# Patient Record
Sex: Female | Born: 1950
Health system: Southern US, Community
[De-identification: ages and names within clinical notes are randomized; demographics above are authoritative.]

## PROBLEM LIST (undated history)

## (undated) DIAGNOSIS — D649 Anemia, unspecified: Secondary | ICD-10-CM

## (undated) DIAGNOSIS — I2510 Atherosclerotic heart disease of native coronary artery without angina pectoris: Secondary | ICD-10-CM

## (undated) HISTORY — PX: TONSILLECTOMY: SUR1361

---

## 2012-06-08 ENCOUNTER — Encounter (INDEPENDENT_AMBULATORY_CARE_PROVIDER_SITE_OTHER): Payer: Self-pay | Admitting: Ophthalmology

## 2012-06-13 ENCOUNTER — Encounter (INDEPENDENT_AMBULATORY_CARE_PROVIDER_SITE_OTHER): Payer: PRIVATE HEALTH INSURANCE | Admitting: Ophthalmology

## 2012-06-13 DIAGNOSIS — H43819 Vitreous degeneration, unspecified eye: Secondary | ICD-10-CM

## 2012-06-13 DIAGNOSIS — H2510 Age-related nuclear cataract, unspecified eye: Secondary | ICD-10-CM

## 2012-06-13 DIAGNOSIS — B399 Histoplasmosis, unspecified: Secondary | ICD-10-CM

## 2014-01-28 DIAGNOSIS — H32 Chorioretinal disorders in diseases classified elsewhere: Secondary | ICD-10-CM | POA: Insufficient documentation

## 2014-01-28 DIAGNOSIS — B399 Histoplasmosis, unspecified: Secondary | ICD-10-CM | POA: Insufficient documentation

## 2015-01-10 DIAGNOSIS — Z23 Encounter for immunization: Secondary | ICD-10-CM | POA: Diagnosis not present

## 2015-06-29 DIAGNOSIS — Z136 Encounter for screening for cardiovascular disorders: Secondary | ICD-10-CM | POA: Diagnosis not present

## 2015-06-29 DIAGNOSIS — Z1239 Encounter for other screening for malignant neoplasm of breast: Secondary | ICD-10-CM | POA: Diagnosis not present

## 2015-06-29 DIAGNOSIS — Z23 Encounter for immunization: Secondary | ICD-10-CM | POA: Diagnosis not present

## 2015-06-29 DIAGNOSIS — Z0000 Encounter for general adult medical examination without abnormal findings: Secondary | ICD-10-CM | POA: Diagnosis not present

## 2015-06-29 DIAGNOSIS — Z720 Tobacco use: Secondary | ICD-10-CM | POA: Diagnosis not present

## 2015-06-29 DIAGNOSIS — Z139 Encounter for screening, unspecified: Secondary | ICD-10-CM | POA: Diagnosis not present

## 2015-06-29 DIAGNOSIS — Z13820 Encounter for screening for osteoporosis: Secondary | ICD-10-CM | POA: Diagnosis not present

## 2015-06-29 DIAGNOSIS — Z131 Encounter for screening for diabetes mellitus: Secondary | ICD-10-CM | POA: Diagnosis not present

## 2015-07-01 DIAGNOSIS — M79674 Pain in right toe(s): Secondary | ICD-10-CM | POA: Diagnosis not present

## 2015-07-01 DIAGNOSIS — B351 Tinea unguium: Secondary | ICD-10-CM | POA: Diagnosis not present

## 2015-07-01 DIAGNOSIS — M79675 Pain in left toe(s): Secondary | ICD-10-CM | POA: Diagnosis not present

## 2015-07-08 DIAGNOSIS — M8589 Other specified disorders of bone density and structure, multiple sites: Secondary | ICD-10-CM | POA: Diagnosis not present

## 2015-07-08 DIAGNOSIS — M810 Age-related osteoporosis without current pathological fracture: Secondary | ICD-10-CM | POA: Diagnosis not present

## 2015-07-08 DIAGNOSIS — Z780 Asymptomatic menopausal state: Secondary | ICD-10-CM | POA: Diagnosis not present

## 2015-07-08 DIAGNOSIS — Z1231 Encounter for screening mammogram for malignant neoplasm of breast: Secondary | ICD-10-CM | POA: Diagnosis not present

## 2015-07-29 DIAGNOSIS — M79675 Pain in left toe(s): Secondary | ICD-10-CM | POA: Diagnosis not present

## 2015-07-29 DIAGNOSIS — M79674 Pain in right toe(s): Secondary | ICD-10-CM | POA: Diagnosis not present

## 2015-07-29 DIAGNOSIS — B351 Tinea unguium: Secondary | ICD-10-CM | POA: Diagnosis not present

## 2015-07-31 DIAGNOSIS — H5212 Myopia, left eye: Secondary | ICD-10-CM | POA: Diagnosis not present

## 2015-07-31 DIAGNOSIS — H52222 Regular astigmatism, left eye: Secondary | ICD-10-CM | POA: Diagnosis not present

## 2015-07-31 DIAGNOSIS — B399 Histoplasmosis, unspecified: Secondary | ICD-10-CM | POA: Diagnosis not present

## 2015-07-31 DIAGNOSIS — H25019 Cortical age-related cataract, unspecified eye: Secondary | ICD-10-CM | POA: Diagnosis not present

## 2015-08-26 DIAGNOSIS — M79675 Pain in left toe(s): Secondary | ICD-10-CM | POA: Diagnosis not present

## 2015-08-26 DIAGNOSIS — M79674 Pain in right toe(s): Secondary | ICD-10-CM | POA: Diagnosis not present

## 2015-08-26 DIAGNOSIS — B351 Tinea unguium: Secondary | ICD-10-CM | POA: Diagnosis not present

## 2015-08-27 ENCOUNTER — Other Ambulatory Visit: Payer: Self-pay | Admitting: Acute Care

## 2015-08-27 DIAGNOSIS — F17210 Nicotine dependence, cigarettes, uncomplicated: Principal | ICD-10-CM

## 2015-10-14 DIAGNOSIS — M79675 Pain in left toe(s): Secondary | ICD-10-CM | POA: Diagnosis not present

## 2015-10-14 DIAGNOSIS — B351 Tinea unguium: Secondary | ICD-10-CM | POA: Diagnosis not present

## 2015-10-14 DIAGNOSIS — M79674 Pain in right toe(s): Secondary | ICD-10-CM | POA: Diagnosis not present

## 2015-12-14 ENCOUNTER — Inpatient Hospital Stay: Admission: RE | Admit: 2015-12-14 | Payer: PRIVATE HEALTH INSURANCE | Source: Ambulatory Visit

## 2015-12-14 ENCOUNTER — Encounter: Payer: PRIVATE HEALTH INSURANCE | Admitting: Acute Care

## 2015-12-15 DIAGNOSIS — Z23 Encounter for immunization: Secondary | ICD-10-CM | POA: Diagnosis not present

## 2016-07-20 DIAGNOSIS — Z1231 Encounter for screening mammogram for malignant neoplasm of breast: Secondary | ICD-10-CM | POA: Diagnosis not present

## 2016-08-01 DIAGNOSIS — M810 Age-related osteoporosis without current pathological fracture: Secondary | ICD-10-CM | POA: Diagnosis not present

## 2016-08-01 DIAGNOSIS — M6248 Contracture of muscle, other site: Secondary | ICD-10-CM | POA: Diagnosis not present

## 2016-08-01 DIAGNOSIS — Z23 Encounter for immunization: Secondary | ICD-10-CM | POA: Diagnosis not present

## 2016-08-01 DIAGNOSIS — Z720 Tobacco use: Secondary | ICD-10-CM | POA: Diagnosis not present

## 2016-08-01 DIAGNOSIS — Z0000 Encounter for general adult medical examination without abnormal findings: Secondary | ICD-10-CM | POA: Diagnosis not present

## 2016-08-03 ENCOUNTER — Other Ambulatory Visit: Payer: Self-pay | Admitting: Acute Care

## 2016-08-03 DIAGNOSIS — F17210 Nicotine dependence, cigarettes, uncomplicated: Secondary | ICD-10-CM

## 2016-08-17 ENCOUNTER — Inpatient Hospital Stay: Admission: RE | Admit: 2016-08-17 | Payer: PRIVATE HEALTH INSURANCE | Source: Ambulatory Visit

## 2016-08-17 ENCOUNTER — Encounter: Payer: PRIVATE HEALTH INSURANCE | Admitting: Acute Care

## 2016-08-18 ENCOUNTER — Telehealth: Payer: Self-pay | Admitting: Acute Care

## 2016-08-19 ENCOUNTER — Encounter: Payer: PRIVATE HEALTH INSURANCE | Admitting: Acute Care

## 2016-08-19 ENCOUNTER — Ambulatory Visit: Payer: PRIVATE HEALTH INSURANCE

## 2016-08-22 NOTE — Telephone Encounter (Signed)
Spoke with pt and rescheduled for [facility] 08/24/16 11:00 CT will be rescheduled Nothing further needed

## 2016-08-24 ENCOUNTER — Encounter: Payer: Self-pay | Admitting: Acute Care

## 2016-08-24 ENCOUNTER — Ambulatory Visit (INDEPENDENT_AMBULATORY_CARE_PROVIDER_SITE_OTHER)
Admission: RE | Admit: 2016-08-24 | Discharge: 2016-08-24 | Disposition: A | Discharge status: Home / Self Care | Payer: Medicare Other | Source: Ambulatory Visit | Attending: Acute Care | Admitting: Acute Care

## 2016-08-24 ENCOUNTER — Ambulatory Visit (INDEPENDENT_AMBULATORY_CARE_PROVIDER_SITE_OTHER): Payer: Medicare Other | Admitting: Acute Care

## 2016-08-24 DIAGNOSIS — Z87891 Personal history of nicotine dependence: Secondary | ICD-10-CM

## 2016-08-24 DIAGNOSIS — F17210 Nicotine dependence, cigarettes, uncomplicated: Secondary | ICD-10-CM

## 2016-08-24 NOTE — Progress Notes (Signed)
Shared Decision Making Visit Lung Cancer Screening Program [phone])   Eligibility:  Age 66 y.o.  Pack Years Smoking History Calculation 73.5 pack year smoking history (# packs/per year x # years smoked)  Recent History of coughing up blood  no  Unexplained weight loss? no ( >Than 15 pounds within the last 6 months )  Prior History Lung / other cancer no (Diagnosis within the last 5 years already requiring surveillance chest CT Scans).  Smoking Status Current Smoker  Former Smokers: Years since quit:NA  Quit Date: NA  Visit Components:  Discussion included one or more decision making aids. yes  Discussion included risk/benefits of screening. yes  Discussion included potential follow up diagnostic testing for abnormal scans. yes  Discussion included meaning and risk of over diagnosis. yes  Discussion included meaning and risk of False Positives. yes  Discussion included meaning of total radiation exposure. yes  Counseling Included:  Importance of adherence to annual lung cancer LDCT screening. yes  Impact of comorbidities on ability to participate in the program. yes  Ability and willingness to under diagnostic treatment. yes  Smoking Cessation Counseling:  Current Smokers:   Discussed importance of smoking cessation. yes  Information about tobacco cessation classes and interventions provided to patient. yes  Patient provided with "ticket" for LDCT Scan. yes  Symptomatic Patient. no  CounselingNA  Diagnosis Code: Tobacco Use Z72.0  Asymptomatic Patient yes  Counseling (Intermediate counseling: > three minutes counseling) B2841  Former Smokers:   Discussed the importance of maintaining cigarette abstinence. yes  Diagnosis Code: Personal History of Nicotine Dependence. L24.401  Information about tobacco cessation classes and interventions provided to patient. Yes  Patient provided with "ticket" for LDCT Scan. yes  Written Order for Lung Cancer  Screening with LDCT placed in Epic. Yes (CT Chest Lung Cancer Screening Low Dose W/O CM) [id number] Z12.2-Screening of respiratory organs Z87.891-Personal history of nicotine dependence  I have spent 25 minutes of face to face time with Ms. Padgett discussing the risks and benefits of lung cancer screening. We viewed a power point together that explained in detail the above noted topics. We paused at intervals to allow for questions to be asked and answered to ensure understanding.We discussed that the single most powerful action that she can take to decrease her risk of developing lung cancer is to quit smoking. We discussed whether or not she is ready to commit to setting a quit date. She is currently not ready to set a quit date.We discussed options for tools to aid in quitting smoking including nicotine replacement therapy, non-nicotine medications, support groups, Quit Smart classes, and behavior modification. We discussed that often times setting smaller, more achievable goals, such as eliminating 1 cigarette a day for a week and then 2 cigarettes a day for a week can be helpful in slowly decreasing the number of cigarettes smoked. This allows for a sense of accomplishment as well as providing a clinical benefit. I gave her the " Be Stronger Than Your Excuses" card with contact information for community resources, classes, free nicotine replacement therapy, and access to mobile apps, text messaging, and on-line smoking cessation help. I have also given her my card and contact information in the event she needs to contact me. We discussed the time and location of the scan, and that either Doroteo Glassman RN or I will call with the results within 24-48 hours of receiving them. I have offered her  a copy of the power point we viewed  as a  resource in the event they need reinforcement of the concepts we discussed today in the office. The patient verbalized understanding of all of  the above and had no further  questions upon leaving the office. They have my contact information in the event they have any further questions.  I spent 5 minutes counseling on smoking cessation and the health risks of continued tobacco abuse.  I explained to the patient that there has been a high incidence of coronary artery disease noted on these exams. I explained that this is a non-gated exam therefore degree or severity cannot be determined. This patient is not on statin therapy. I have asked the patient to follow-up with their PCP regarding any incidental finding of coronary artery disease and management with risk factor assessment,  diet or medication as their PCP  feels is clinically indicated. The patient verbalized understanding of the above and had no further questions upon completion of the visit.      Magdalen Spatz, NP 08/24/2016

## 2016-09-01 ENCOUNTER — Other Ambulatory Visit: Payer: Self-pay | Admitting: Acute Care

## 2016-09-01 DIAGNOSIS — F17210 Nicotine dependence, cigarettes, uncomplicated: Principal | ICD-10-CM

## 2017-01-19 DIAGNOSIS — Z23 Encounter for immunization: Secondary | ICD-10-CM | POA: Diagnosis not present

## 2017-01-20 DIAGNOSIS — H3581 Retinal edema: Secondary | ICD-10-CM | POA: Diagnosis not present

## 2017-01-20 DIAGNOSIS — B399 Histoplasmosis, unspecified: Secondary | ICD-10-CM | POA: Diagnosis not present

## 2017-01-23 ENCOUNTER — Encounter (INDEPENDENT_AMBULATORY_CARE_PROVIDER_SITE_OTHER): Payer: Medicare Other | Admitting: Ophthalmology

## 2017-01-23 DIAGNOSIS — H43813 Vitreous degeneration, bilateral: Secondary | ICD-10-CM | POA: Diagnosis not present

## 2017-01-23 DIAGNOSIS — H2513 Age-related nuclear cataract, bilateral: Secondary | ICD-10-CM | POA: Diagnosis not present

## 2017-01-23 DIAGNOSIS — H32 Chorioretinal disorders in diseases classified elsewhere: Secondary | ICD-10-CM | POA: Diagnosis not present

## 2017-01-23 DIAGNOSIS — B399 Histoplasmosis, unspecified: Secondary | ICD-10-CM

## 2017-01-23 DIAGNOSIS — H318 Other specified disorders of choroid: Secondary | ICD-10-CM

## 2017-01-31 ENCOUNTER — Encounter (INDEPENDENT_AMBULATORY_CARE_PROVIDER_SITE_OTHER): Payer: Medicare Other | Admitting: Ophthalmology

## 2017-01-31 DIAGNOSIS — H43813 Vitreous degeneration, bilateral: Secondary | ICD-10-CM | POA: Diagnosis not present

## 2017-01-31 DIAGNOSIS — H318 Other specified disorders of choroid: Secondary | ICD-10-CM

## 2017-01-31 DIAGNOSIS — L820 Inflamed seborrheic keratosis: Secondary | ICD-10-CM | POA: Diagnosis not present

## 2017-01-31 DIAGNOSIS — L299 Pruritus, unspecified: Secondary | ICD-10-CM | POA: Diagnosis not present

## 2017-02-17 ENCOUNTER — Encounter (INDEPENDENT_AMBULATORY_CARE_PROVIDER_SITE_OTHER): Payer: Medicare Other | Admitting: Ophthalmology

## 2017-02-17 DIAGNOSIS — H318 Other specified disorders of choroid: Secondary | ICD-10-CM | POA: Diagnosis not present

## 2017-02-17 DIAGNOSIS — H43813 Vitreous degeneration, bilateral: Secondary | ICD-10-CM | POA: Diagnosis not present

## 2017-02-17 DIAGNOSIS — H32 Chorioretinal disorders in diseases classified elsewhere: Secondary | ICD-10-CM | POA: Diagnosis not present

## 2017-02-17 DIAGNOSIS — H2513 Age-related nuclear cataract, bilateral: Secondary | ICD-10-CM | POA: Diagnosis not present

## 2017-02-17 DIAGNOSIS — B399 Histoplasmosis, unspecified: Secondary | ICD-10-CM

## 2017-03-17 ENCOUNTER — Encounter (INDEPENDENT_AMBULATORY_CARE_PROVIDER_SITE_OTHER): Payer: Medicare Other | Admitting: Ophthalmology

## 2017-03-17 DIAGNOSIS — H318 Other specified disorders of choroid: Secondary | ICD-10-CM | POA: Diagnosis not present

## 2017-03-17 DIAGNOSIS — B399 Histoplasmosis, unspecified: Secondary | ICD-10-CM

## 2017-03-17 DIAGNOSIS — H43813 Vitreous degeneration, bilateral: Secondary | ICD-10-CM | POA: Diagnosis not present

## 2017-03-17 DIAGNOSIS — H32 Chorioretinal disorders in diseases classified elsewhere: Secondary | ICD-10-CM | POA: Diagnosis not present

## 2017-04-14 ENCOUNTER — Encounter (INDEPENDENT_AMBULATORY_CARE_PROVIDER_SITE_OTHER): Payer: Medicare Other | Admitting: Ophthalmology

## 2017-04-14 DIAGNOSIS — H318 Other specified disorders of choroid: Secondary | ICD-10-CM

## 2017-04-14 DIAGNOSIS — H2513 Age-related nuclear cataract, bilateral: Secondary | ICD-10-CM | POA: Diagnosis not present

## 2017-04-14 DIAGNOSIS — H32 Chorioretinal disorders in diseases classified elsewhere: Secondary | ICD-10-CM | POA: Diagnosis not present

## 2017-04-14 DIAGNOSIS — H43813 Vitreous degeneration, bilateral: Secondary | ICD-10-CM | POA: Diagnosis not present

## 2017-05-12 ENCOUNTER — Encounter (INDEPENDENT_AMBULATORY_CARE_PROVIDER_SITE_OTHER): Payer: Medicare Other | Admitting: Ophthalmology

## 2017-05-12 DIAGNOSIS — H2513 Age-related nuclear cataract, bilateral: Secondary | ICD-10-CM

## 2017-05-12 DIAGNOSIS — H43813 Vitreous degeneration, bilateral: Secondary | ICD-10-CM

## 2017-05-12 DIAGNOSIS — H318 Other specified disorders of choroid: Secondary | ICD-10-CM

## 2017-06-09 ENCOUNTER — Encounter (INDEPENDENT_AMBULATORY_CARE_PROVIDER_SITE_OTHER): Payer: Medicare Other | Admitting: Ophthalmology

## 2017-06-09 DIAGNOSIS — H2513 Age-related nuclear cataract, bilateral: Secondary | ICD-10-CM | POA: Diagnosis not present

## 2017-06-09 DIAGNOSIS — H318 Other specified disorders of choroid: Secondary | ICD-10-CM

## 2017-06-09 DIAGNOSIS — H43813 Vitreous degeneration, bilateral: Secondary | ICD-10-CM | POA: Diagnosis not present

## 2017-06-09 DIAGNOSIS — B399 Histoplasmosis, unspecified: Secondary | ICD-10-CM | POA: Diagnosis not present

## 2017-06-09 DIAGNOSIS — H32 Chorioretinal disorders in diseases classified elsewhere: Secondary | ICD-10-CM

## 2017-07-06 ENCOUNTER — Encounter (INDEPENDENT_AMBULATORY_CARE_PROVIDER_SITE_OTHER): Payer: Medicare Other | Admitting: Ophthalmology

## 2017-07-06 DIAGNOSIS — H318 Other specified disorders of choroid: Secondary | ICD-10-CM | POA: Diagnosis not present

## 2017-07-06 DIAGNOSIS — H2513 Age-related nuclear cataract, bilateral: Secondary | ICD-10-CM | POA: Diagnosis not present

## 2017-07-06 DIAGNOSIS — B399 Histoplasmosis, unspecified: Secondary | ICD-10-CM

## 2017-07-06 DIAGNOSIS — H32 Chorioretinal disorders in diseases classified elsewhere: Secondary | ICD-10-CM

## 2017-07-06 DIAGNOSIS — H43813 Vitreous degeneration, bilateral: Secondary | ICD-10-CM | POA: Diagnosis not present

## 2017-08-03 ENCOUNTER — Encounter (INDEPENDENT_AMBULATORY_CARE_PROVIDER_SITE_OTHER): Payer: Medicare Other | Admitting: Ophthalmology

## 2017-08-03 DIAGNOSIS — Z720 Tobacco use: Secondary | ICD-10-CM | POA: Diagnosis not present

## 2017-08-03 DIAGNOSIS — Z131 Encounter for screening for diabetes mellitus: Secondary | ICD-10-CM | POA: Diagnosis not present

## 2017-08-03 DIAGNOSIS — M810 Age-related osteoporosis without current pathological fracture: Secondary | ICD-10-CM | POA: Diagnosis not present

## 2017-08-03 DIAGNOSIS — Z0000 Encounter for general adult medical examination without abnormal findings: Secondary | ICD-10-CM | POA: Diagnosis not present

## 2017-08-04 ENCOUNTER — Encounter (INDEPENDENT_AMBULATORY_CARE_PROVIDER_SITE_OTHER): Payer: Medicare Other | Admitting: Ophthalmology

## 2017-08-04 DIAGNOSIS — H43813 Vitreous degeneration, bilateral: Secondary | ICD-10-CM

## 2017-08-04 DIAGNOSIS — H318 Other specified disorders of choroid: Secondary | ICD-10-CM

## 2017-08-04 DIAGNOSIS — H2513 Age-related nuclear cataract, bilateral: Secondary | ICD-10-CM

## 2017-08-04 DIAGNOSIS — H4423 Degenerative myopia, bilateral: Secondary | ICD-10-CM

## 2017-09-08 ENCOUNTER — Encounter (INDEPENDENT_AMBULATORY_CARE_PROVIDER_SITE_OTHER): Payer: Medicare Other | Admitting: Ophthalmology

## 2017-09-08 DIAGNOSIS — H43813 Vitreous degeneration, bilateral: Secondary | ICD-10-CM

## 2017-09-08 DIAGNOSIS — H318 Other specified disorders of choroid: Secondary | ICD-10-CM

## 2017-09-08 DIAGNOSIS — B399 Histoplasmosis, unspecified: Secondary | ICD-10-CM

## 2017-09-08 DIAGNOSIS — H32 Chorioretinal disorders in diseases classified elsewhere: Secondary | ICD-10-CM

## 2017-09-13 DIAGNOSIS — B399 Histoplasmosis, unspecified: Secondary | ICD-10-CM | POA: Diagnosis not present

## 2017-09-13 DIAGNOSIS — H3581 Retinal edema: Secondary | ICD-10-CM | POA: Diagnosis not present

## 2017-10-02 ENCOUNTER — Other Ambulatory Visit: Payer: Self-pay | Admitting: *Deleted

## 2017-10-02 DIAGNOSIS — Z122 Encounter for screening for malignant neoplasm of respiratory organs: Secondary | ICD-10-CM

## 2017-10-02 DIAGNOSIS — F17210 Nicotine dependence, cigarettes, uncomplicated: Principal | ICD-10-CM

## 2017-10-13 ENCOUNTER — Encounter (INDEPENDENT_AMBULATORY_CARE_PROVIDER_SITE_OTHER): Payer: Medicare Other | Admitting: Ophthalmology

## 2017-10-13 DIAGNOSIS — B399 Histoplasmosis, unspecified: Secondary | ICD-10-CM

## 2017-10-13 DIAGNOSIS — H43813 Vitreous degeneration, bilateral: Secondary | ICD-10-CM | POA: Diagnosis not present

## 2017-10-13 DIAGNOSIS — H318 Other specified disorders of choroid: Secondary | ICD-10-CM | POA: Diagnosis not present

## 2017-10-13 DIAGNOSIS — H32 Chorioretinal disorders in diseases classified elsewhere: Secondary | ICD-10-CM

## 2017-11-20 ENCOUNTER — Ambulatory Visit (INDEPENDENT_AMBULATORY_CARE_PROVIDER_SITE_OTHER)
Admission: RE | Admit: 2017-11-20 | Discharge: 2017-11-20 | Disposition: A | Discharge status: Home / Self Care | Payer: Medicare Other | Source: Ambulatory Visit | Attending: Acute Care | Admitting: Acute Care

## 2017-11-20 DIAGNOSIS — F17210 Nicotine dependence, cigarettes, uncomplicated: Secondary | ICD-10-CM

## 2017-11-23 ENCOUNTER — Other Ambulatory Visit: Payer: Self-pay | Admitting: Acute Care

## 2017-11-23 DIAGNOSIS — Z122 Encounter for screening for malignant neoplasm of respiratory organs: Secondary | ICD-10-CM

## 2017-11-23 DIAGNOSIS — F17210 Nicotine dependence, cigarettes, uncomplicated: Principal | ICD-10-CM

## 2017-11-24 ENCOUNTER — Encounter (INDEPENDENT_AMBULATORY_CARE_PROVIDER_SITE_OTHER): Payer: Medicare Other | Admitting: Ophthalmology

## 2017-11-27 ENCOUNTER — Encounter (INDEPENDENT_AMBULATORY_CARE_PROVIDER_SITE_OTHER): Payer: Medicare Other | Admitting: Ophthalmology

## 2017-11-27 DIAGNOSIS — H32 Chorioretinal disorders in diseases classified elsewhere: Secondary | ICD-10-CM

## 2017-11-27 DIAGNOSIS — H3563 Retinal hemorrhage, bilateral: Secondary | ICD-10-CM | POA: Diagnosis not present

## 2017-11-27 DIAGNOSIS — B399 Histoplasmosis, unspecified: Secondary | ICD-10-CM

## 2017-11-27 DIAGNOSIS — H2513 Age-related nuclear cataract, bilateral: Secondary | ICD-10-CM

## 2017-11-27 DIAGNOSIS — H43813 Vitreous degeneration, bilateral: Secondary | ICD-10-CM | POA: Diagnosis not present

## 2017-12-06 DIAGNOSIS — M8588 Other specified disorders of bone density and structure, other site: Secondary | ICD-10-CM | POA: Diagnosis not present

## 2017-12-06 DIAGNOSIS — Z1231 Encounter for screening mammogram for malignant neoplasm of breast: Secondary | ICD-10-CM | POA: Diagnosis not present

## 2017-12-06 DIAGNOSIS — M810 Age-related osteoporosis without current pathological fracture: Secondary | ICD-10-CM | POA: Diagnosis not present

## 2017-12-15 DIAGNOSIS — M810 Age-related osteoporosis without current pathological fracture: Secondary | ICD-10-CM | POA: Diagnosis not present

## 2017-12-29 DIAGNOSIS — M810 Age-related osteoporosis without current pathological fracture: Secondary | ICD-10-CM | POA: Diagnosis not present

## 2018-01-08 ENCOUNTER — Encounter (INDEPENDENT_AMBULATORY_CARE_PROVIDER_SITE_OTHER): Payer: Medicare Other | Admitting: Ophthalmology

## 2018-01-08 DIAGNOSIS — H2513 Age-related nuclear cataract, bilateral: Secondary | ICD-10-CM

## 2018-01-08 DIAGNOSIS — H43813 Vitreous degeneration, bilateral: Secondary | ICD-10-CM | POA: Diagnosis not present

## 2018-01-08 DIAGNOSIS — B399 Histoplasmosis, unspecified: Secondary | ICD-10-CM | POA: Diagnosis not present

## 2018-01-08 DIAGNOSIS — H318 Other specified disorders of choroid: Secondary | ICD-10-CM

## 2018-01-08 DIAGNOSIS — H32 Chorioretinal disorders in diseases classified elsewhere: Secondary | ICD-10-CM

## 2018-01-23 ENCOUNTER — Emergency Department (HOSPITAL_COMMUNITY): Payer: Medicare Other

## 2018-01-23 ENCOUNTER — Other Ambulatory Visit: Payer: Self-pay

## 2018-01-23 ENCOUNTER — Emergency Department (HOSPITAL_BASED_OUTPATIENT_CLINIC_OR_DEPARTMENT_OTHER): Payer: Medicare Other

## 2018-01-23 ENCOUNTER — Encounter (HOSPITAL_COMMUNITY): Payer: Self-pay | Admitting: Emergency Medicine

## 2018-01-23 ENCOUNTER — Observation Stay (HOSPITAL_COMMUNITY)
Admission: EM | Admit: 2018-01-23 | Discharge: 2018-01-24 | Disposition: A | Discharge status: Home / Self Care | Payer: Medicare Other | Attending: Internal Medicine | Admitting: Internal Medicine

## 2018-01-23 DIAGNOSIS — I824Z1 Acute embolism and thrombosis of unspecified deep veins of right distal lower extremity: Secondary | ICD-10-CM

## 2018-01-23 DIAGNOSIS — J40 Bronchitis, not specified as acute or chronic: Secondary | ICD-10-CM | POA: Diagnosis not present

## 2018-01-23 DIAGNOSIS — I2510 Atherosclerotic heart disease of native coronary artery without angina pectoris: Secondary | ICD-10-CM | POA: Diagnosis not present

## 2018-01-23 DIAGNOSIS — I82441 Acute embolism and thrombosis of right tibial vein: Secondary | ICD-10-CM | POA: Diagnosis not present

## 2018-01-23 DIAGNOSIS — D649 Anemia, unspecified: Principal | ICD-10-CM | POA: Diagnosis present

## 2018-01-23 DIAGNOSIS — E538 Deficiency of other specified B group vitamins: Secondary | ICD-10-CM | POA: Diagnosis present

## 2018-01-23 DIAGNOSIS — F17210 Nicotine dependence, cigarettes, uncomplicated: Secondary | ICD-10-CM | POA: Diagnosis not present

## 2018-01-23 DIAGNOSIS — I82409 Acute embolism and thrombosis of unspecified deep veins of unspecified lower extremity: Secondary | ICD-10-CM | POA: Diagnosis present

## 2018-01-23 DIAGNOSIS — R0602 Shortness of breath: Secondary | ICD-10-CM

## 2018-01-23 DIAGNOSIS — D61818 Other pancytopenia: Secondary | ICD-10-CM | POA: Diagnosis not present

## 2018-01-23 DIAGNOSIS — R9431 Abnormal electrocardiogram [ECG] [EKG]: Secondary | ICD-10-CM | POA: Diagnosis not present

## 2018-01-23 DIAGNOSIS — D510 Vitamin B12 deficiency anemia due to intrinsic factor deficiency: Secondary | ICD-10-CM

## 2018-01-23 HISTORY — DX: Atherosclerotic heart disease of native coronary artery without angina pectoris: I25.10

## 2018-01-23 LAB — COMPREHENSIVE METABOLIC PANEL
ALK PHOS: 58 U/L (ref 38–126)
ALT: 12 U/L (ref 0–44)
AST: 46 U/L — ABNORMAL HIGH (ref 15–41)
Albumin: 3.9 g/dL (ref 3.5–5.0)
Anion gap: 9 (ref 5–15)
BUN: 18 mg/dL (ref 8–23)
CO2: 27 mmol/L (ref 22–32)
CREATININE: 0.78 mg/dL (ref 0.44–1.00)
Calcium: 9.8 mg/dL (ref 8.9–10.3)
Chloride: 105 mmol/L (ref 98–111)
Glucose, Bld: 112 mg/dL — ABNORMAL HIGH (ref 70–99)
Potassium: 3.6 mmol/L (ref 3.5–5.1)
Sodium: 141 mmol/L (ref 135–145)
Total Bilirubin: 1.5 mg/dL — ABNORMAL HIGH (ref 0.3–1.2)
Total Protein: 6.9 g/dL (ref 6.5–8.1)

## 2018-01-23 LAB — FOLATE: Folate: 7.5 ng/mL (ref >5.9)

## 2018-01-23 LAB — IRON AND TIBC
IRON: 299 ug/dL — AB (ref 28–170)
Saturation Ratios: 89 % — ABNORMAL HIGH (ref 10.4–31.8)
TIBC: 335 ug/dL (ref 250–450)
UIBC: 36 ug/dL

## 2018-01-23 LAB — PROTIME-INR
INR: 1.03
PROTHROMBIN TIME: 13.5 s (ref 11.4–15.2)

## 2018-01-23 LAB — CBC WITH DIFFERENTIAL/PLATELET
Abs Immature Granulocytes: 0.07 10*3/uL (ref 0.00–0.07)
BASOS PCT: 0 %
Basophils Absolute: 0 10*3/uL (ref 0.0–0.1)
EOS ABS: 0.2 10*3/uL (ref 0.0–0.5)
EOS PCT: 5 %
HEMATOCRIT: 14.4 % — AB (ref 36.0–46.0)
HEMOGLOBIN: 4.9 g/dL — AB (ref 12.0–15.0)
Immature Granulocytes: 2 %
Lymphocytes Relative: 27 %
Lymphs Abs: 1 10*3/uL (ref 0.7–4.0)
MCH: 38.9 pg — AB (ref 26.0–34.0)
MCHC: 34 g/dL (ref 30.0–36.0)
MCV: 114.3 fL — ABNORMAL HIGH (ref 80.0–100.0)
MONOS PCT: 4 %
Monocytes Absolute: 0.2 10*3/uL (ref 0.1–1.0)
Neutro Abs: 2.4 10*3/uL (ref 1.7–7.7)
Neutrophils Relative %: 62 %
Platelets: 48 10*3/uL — ABNORMAL LOW (ref 150–400)
RBC: 1.26 MIL/uL — ABNORMAL LOW (ref 3.87–5.11)
RDW: 21.9 % — ABNORMAL HIGH (ref 11.5–15.5)
WBC: 3.9 10*3/uL — ABNORMAL LOW (ref 4.0–10.5)
nRBC: 0.5 % — ABNORMAL HIGH (ref 0.0–0.2)

## 2018-01-23 LAB — PREPARE RBC (CROSSMATCH)

## 2018-01-23 LAB — RETICULOCYTES
Immature Retic Fract: 12.3 % (ref 2.3–15.9)
RBC.: 1.31 MIL/uL — ABNORMAL LOW (ref 3.87–5.11)
RETIC CT PCT: 2.4 % (ref 0.4–3.1)
Retic Count, Absolute: 31.7 10*3/uL (ref 19.0–186.0)

## 2018-01-23 LAB — ACETAMINOPHEN LEVEL

## 2018-01-23 LAB — VITAMIN B12: Vitamin B-12: <50 pg/mL — ABNORMAL LOW (ref 180–914)

## 2018-01-23 LAB — SALICYLATE LEVEL

## 2018-01-23 LAB — I-STAT TROPONIN, ED: TROPONIN I, POC: 0 ng/mL (ref 0.00–0.08)

## 2018-01-23 LAB — BILIRUBIN, DIRECT: Bilirubin, Direct: 0.3 mg/dL — ABNORMAL HIGH (ref 0.0–0.2)

## 2018-01-23 LAB — POC OCCULT BLOOD, ED: FECAL OCCULT BLD: NEGATIVE

## 2018-01-23 LAB — FERRITIN: Ferritin: 66 ng/mL (ref 11–307)

## 2018-01-23 LAB — AMMONIA: Ammonia: 25 umol/L (ref 9–35)

## 2018-01-23 LAB — BRAIN NATRIURETIC PEPTIDE: B NATRIURETIC PEPTIDE 5: 157.5 pg/mL — AB (ref 0.0–100.0)

## 2018-01-23 MED ORDER — ACETAMINOPHEN 650 MG RE SUPP: 650.0000 mg | Freq: Four times a day (QID) | RECTAL | Status: DC | PRN

## 2018-01-23 MED ORDER — IOPAMIDOL (ISOVUE-370) INJECTION 76%
INTRAVENOUS | Status: AC
  Filled 2018-01-23: qty 100

## 2018-01-23 MED ORDER — NICOTINE 21 MG/24HR TD PT24
21.0000 mg | MEDICATED_PATCH | Freq: Once | TRANSDERMAL | Status: AC
  Administered 2018-01-23: 21 mg via TRANSDERMAL
  Filled 2018-01-23: qty 1

## 2018-01-23 MED ORDER — SODIUM CHLORIDE 0.9 % IV SOLN
10.0000 mL/h | Freq: Once | INTRAVENOUS | Status: AC
  Administered 2018-01-23: 10 mL/h via INTRAVENOUS

## 2018-01-23 MED ORDER — ONDANSETRON HCL 4 MG/2ML IJ SOLN: 4.0000 mg | Freq: Four times a day (QID) | INTRAMUSCULAR | Status: DC | PRN

## 2018-01-23 MED ORDER — ONDANSETRON HCL 4 MG PO TABS: 4.0000 mg | ORAL_TABLET | Freq: Four times a day (QID) | ORAL | Status: DC | PRN

## 2018-01-23 MED ORDER — CYANOCOBALAMIN 1000 MCG/ML IJ SOLN
1000.0000 ug | Freq: Every day | INTRAMUSCULAR | Status: DC
  Administered 2018-01-23 – 2018-01-24 (×2): 1000 ug via SUBCUTANEOUS
  Filled 2018-01-23 (×2): qty 1

## 2018-01-23 MED ORDER — HEPARIN SODIUM (PORCINE) 5000 UNIT/ML IJ SOLN
5000.0000 [IU] | Freq: Three times a day (TID) | INTRAMUSCULAR | Status: DC
  Administered 2018-01-23: 5000 [IU] via SUBCUTANEOUS
  Filled 2018-01-23: qty 1

## 2018-01-23 MED ORDER — SODIUM CHLORIDE 0.9 % IJ SOLN
INTRAMUSCULAR | Status: AC
  Filled 2018-01-23: qty 50

## 2018-01-23 MED ORDER — ACETAMINOPHEN 325 MG PO TABS
650.0000 mg | ORAL_TABLET | Freq: Four times a day (QID) | ORAL | Status: DC | PRN
  Administered 2018-01-24: 650 mg via ORAL
  Filled 2018-01-23: qty 2

## 2018-01-23 NOTE — ED Triage Notes (Signed)
Pt c/o worsening shortness of breath x 10 days. Pt denies pain. Pt with recent jaundice x 1-2 days. Pt saw PMD today and PMD performed EKG and noted non-specific ST depression.

## 2018-01-23 NOTE — ED Notes (Signed)
Bed: WA04 Expected date:  Expected time:  Means of arrival:  Comments: Hold triage 2

## 2018-01-23 NOTE — H&P (Addendum)
History and Physical    Jodi Myers QIH:[id number] DOB: 02-Oct-1950 DOA: 01/23/2018  PCP: Michel Harrow, PA-C  Patient coming from: Home.  Chief Complaint: Shortness of breath.  HPI: Jodi Myers is a 67 y.o. female with no significant past medical history presents to the ER with complaints of increasing shortness of breath on exertion over the last 10 days.  Had some nausea on 10 days ago.  He denies abdominal pain chest pain productive cough fever or chills.  Symptoms are mostly exertional.  Takes Excedrin for many years for right shoulder pain.  Has had endoscopy done for stomach ulcer 40 years ago after taking steroids.  ED Course: In the ER patient is found to have severe pancytopenia with a hemoglobin around 4.9 with platelets of 48 and WBC of 3.9.  On exam patient has right lower extremity swelling "which Dopplers were done which was positive for DVT.  Chest x-ray was unremarkable EKG was showing sinus tachycardia troponin is negative.  Anemia panel was done which shows undetectable B12 levels.  Ferritin was 66.  Folate was normal.  Review of Systems: As per HPI, rest all negative.   Past Medical History:  Diagnosis Date  . Coronary artery disease     Past Surgical History:  Procedure Laterality Date  . TONSILLECTOMY       reports that she has been smoking. She has a 73.50 pack-year smoking history. She has never used smokeless tobacco. She reports that she drank alcohol. She reports that she does not use drugs.  Allergies  Allergen Reactions  . Mango Flavor     Blisters  . Poison Ivy Extract     Blisters  . Sporanox [Itraconazole]     Blisters    Family History  Problem Relation Age of Onset  . Diabetes Mellitus II Mother   . Diabetes Mellitus I Sister   . Colon cancer Neg Hx     Prior to Admission medications   Not on File    Physical Exam: Vitals:   01/23/18 1624 01/23/18 2012 01/23/18 2022 01/23/18 2039  BP: (!) 103/41 (!) 105/48  113/60    Pulse:  100  (!) 111  Resp: 17 (!) 22  20  Temp:   99.2 F (37.3 C) (!) 100.7 F (38.2 C)  TempSrc:   Oral Oral  SpO2:  95%    Weight:    70.5 kg  Height:    5\' 5"  (1.651 m)      Constitutional: Moderately built and nourished. Vitals:   01/23/18 1624 01/23/18 2012 01/23/18 2022 01/23/18 2039  BP: (!) 103/41 (!) 105/48  113/60  Pulse:  100  (!) 111  Resp: 17 (!) 22  20  Temp:   99.2 F (37.3 C) (!) 100.7 F (38.2 C)  TempSrc:   Oral Oral  SpO2:  95%    Weight:    70.5 kg  Height:    5\' 5"  (1.651 m)   Eyes: Anicteric mild pallor. ENMT: No discharge from the ears eyes nose or mouth. Neck: No mass or.  No JVD appreciated. Respiratory: No rhonchi or crepitations. Cardiovascular: S1-S2 heard no murmurs appreciated. Abdomen: Soft nontender bowel sounds present. Musculoskeletal: Mitral extremity swelling. Skin: No rash. Neurologic: Alert awake oriented to time place and person.  Moves all extremities. Psychiatric: Appears normal per normal affect.   Labs on Admission: I have personally reviewed following labs and imaging studies  CBC: Recent Labs  Lab 01/23/18 1645  WBC 3.9*  NEUTROABS  2.4  HGB 4.9*  HCT 14.4*  MCV 114.3*  PLT 48*   Basic Metabolic Panel: Recent Labs  Lab 01/23/18 1645  NA 141  K 3.6  CL 105  CO2 27  GLUCOSE 112*  BUN 18  CREATININE 0.78  CALCIUM 9.8   GFR: Estimated Creatinine Clearance: 66.3 mL/min (by C-G formula based on SCr of 0.78 mg/dL). Liver Function Tests: Recent Labs  Lab 01/23/18 1645  AST 46*  ALT 12  ALKPHOS 58  BILITOT 1.5*  PROT 6.9  ALBUMIN 3.9   No results for input(s): LIPASE, AMYLASE in the last 168 hours. Recent Labs  Lab 01/23/18 1732  AMMONIA 25   Coagulation Profile: Recent Labs  Lab 01/23/18 1645  INR 1.03   Cardiac Enzymes: No results for input(s): CKTOTAL, CKMB, CKMBINDEX, TROPONINI in the last 168 hours. BNP (last 3 results) No results for input(s): PROBNP in the last 8760  hours. HbA1C: No results for input(s): HGBA1C in the last 72 hours. CBG: No results for input(s): GLUCAP in the last 168 hours. Lipid Profile: No results for input(s): CHOL, HDL, LDLCALC, TRIG, CHOLHDL, LDLDIRECT in the last 72 hours. Thyroid Function Tests: No results for input(s): TSH, T4TOTAL, FREET4, T3FREE, THYROIDAB in the last 72 hours. Anemia Panel: Recent Labs    01/23/18 1942  VITAMINB12 <50*  FOLATE 7.5  FERRITIN 66  TIBC 335  IRON 299*  RETICCTPCT 2.4   Urine analysis: No results found for: COLORURINE, APPEARANCEUR, LABSPEC, PHURINE, GLUCOSEU, HGBUR, BILIRUBINUR, KETONESUR, PROTEINUR, UROBILINOGEN, NITRITE, LEUKOCYTESUR Sepsis Labs: @LABRCNTIP (procalcitonin:4,lacticidven:4) )No results found for this or any previous visit (from the past 240 hour(s)).   Radiological Exams on Admission: Dg Chest 2 View  Result Date: 01/23/2018 CLINICAL DATA:  Shortness of breath, jaundice. EXAM: CHEST - 2 VIEW COMPARISON:  CT chest November 20, 2017 FINDINGS: Cardiomediastinal silhouette is normal. Mild bronchitic changes. No pleural effusions or focal consolidations. Trachea projects midline and there is no pneumothorax. Soft tissue planes and included osseous structures are non-suspicious. Faint calcifications in the neck are likely vascular. Thoracolumbar dextroscoliosis. IMPRESSION: Bronchitic changes without focal consolidation. Electronically Signed   By: Elon Alas M.D.   On: 01/23/2018 16:34    EKG: Independently reviewed.  Sinus tachycardia.  Assessment/Plan Principal Problem:   Symptomatic anemia Active Problems:   Pancytopenia (HCC)   DVT, lower extremity (HCC)   B12 deficiency    1. Symptomatic anemia -3 units of PRBC transfusion has been ordered.  Follow CBC.  Note that patient stool for blood was negative.  Patient denies noting any blood in the stool. 2. Pancytopenia may be from B12 deficiency.  Discussed with on-call hematologist Dr. Irene Limbo who at this time  advised to get B12 thousand micrograms subcu every day for next 7 days and following which patient will need every week.  Check increasing factor antibodies and parietal cell antibodies with the next blood draw.  Hematologist will be seeing patient in consult. 3. Right lower extremity DVT involving the tibial oriented.  Since this is a low risk DVT and patient has significant thrombocytopenia at this time hematologist Dr. Irene Limbo advised on a DVT prophylaxis and not build on the platelets are improving.  Since patient also has shortness of breath CT angiogram of the chest has been ordered during which will also be getting CT abdomen also.   DVT prophylaxis: Heparin. Code Status: Full code. Family Communication: Patient's daughter. Disposition Plan: Home. Consults called: Oncologist. Admission status: Observation.   [name] MD [facility] Pager  [place] [phone].  If 7PM-7AM, please contact night-coverage [email or web address] Password [facility]  01/23/2018, 10:19 PM

## 2018-01-23 NOTE — Progress Notes (Signed)
Preliminary notes--Right lower extremity venous duplex exam completed. Acute positive DVT involving posterior tibial vein in the mid calf region.  Jodi Myers (RDMS RVT) 01/23/18 7:20 PM

## 2018-01-23 NOTE — Progress Notes (Signed)
Came to the room, patient was not here. I went to ask the front desk, and asked her to page the RN to see where patient was. Patient came back with a doctor was asking questions. Will try when doctor finishes.  Hongying Landry Mellow (RDMS RVT) 01/23/18 6:36 PM

## 2018-01-23 NOTE — ED Notes (Signed)
ED TO INPATIENT HANDOFF REPORT  Name/Age/Gender Jodi Myers 67 y.o. female  Code Status Advance Directive Documentation     Most Recent Value  Type of Advance Directive  Healthcare Power of Attorney, Living will  Pre-existing out of facility DNR order (yellow form or pink MOST form)  -  "MOST" Form in Place?  -      Home/SNF/Other Home  Chief Complaint short of breath   Level of Care/Admitting Diagnosis ED Disposition    ED Disposition Condition [place]: [place] [100102]  Level of Care: Telemetry [5]  Admit to tele based on following criteria: Monitor for Ischemic changes  Diagnosis: Symptomatic anemia [1027253]  Admitting Physician: [name] [phone]  Attending Physician: [name] [email or web address]  PT Class (Do Not Modify): Observation [104]  PT Acc Code (Do Not Modify): Observation [10022]       Medical History Past Medical History:  Diagnosis Date  . Coronary artery disease     Allergies Allergies  Allergen Reactions  . Mango Flavor     Blisters  . Poison Ivy Extract     Blisters  . Sporanox [Itraconazole]     Blisters    IV Location/Drains/Wounds Patient Lines/Drains/Airways Status   Active Line/Drains/Airways    Name:   Placement date:   Placement time:   Site:   Days:   Peripheral IV 01/23/18 Left;Lateral   01/23/18    1800    -   less than 1   Peripheral IV 01/23/18 Right;Anterior Forearm   01/23/18    2001    Forearm   less than 1          Labs/Imaging Results for orders placed or performed during the hospital encounter of 01/23/18 (from the past 48 hour(s))  Comprehensive metabolic panel     Status: Abnormal   Collection Time: 01/23/18  4:45 PM  Result Value Ref Range   Sodium 141 135 - 145 mmol/L   Potassium 3.6 3.5 - 5.1 mmol/L   Chloride 105 98 - 111 mmol/L   CO2 27 22 - 32 mmol/L   Glucose, Bld 112 (H) 70 - 99 mg/dL   BUN 18 8 - 23 mg/dL   Creatinine, Ser 0.78 0.44 -  1.00 mg/dL   Calcium 9.8 8.9 - 10.3 mg/dL   Total Protein 6.9 6.5 - 8.1 g/dL   Albumin 3.9 3.5 - 5.0 g/dL   AST 46 (H) 15 - 41 U/L   ALT 12 0 - 44 U/L   Alkaline Phosphatase 58 38 - 126 U/L   Total Bilirubin 1.5 (H) 0.3 - 1.2 mg/dL   GFR calc non Af Amer >60 >60 mL/min   GFR calc Af Amer >60 >60 mL/min    Comment: (NOTE) The eGFR has been calculated using the CKD EPI equation. This calculation has not been validated in all clinical situations. eGFR's persistently <60 mL/min signify possible Chronic Kidney Disease.    Anion gap 9 5 - 15    Comment: Performed at [facility], [place] [address] [place], [place] [address]  Acetaminophen level     Status: Abnormal   Collection Time: 01/23/18  4:45 PM  Result Value Ref Range   Acetaminophen (Tylenol), Serum <10 (L) 10 - 30 ug/mL    Comment: (NOTE) Therapeutic concentrations vary significantly. A range of 10-30 ug/mL  may be an effective concentration for many patients. However, some  are best treated at concentrations outside of this  range. Acetaminophen concentrations >150 ug/mL at 4 hours after ingestion  and >50 ug/mL at 12 hours after ingestion are often associated with  toxic reactions. Performed at [facility], [place] [address] [place], [place] [address]   Salicylate level     Status: None   Collection Time: 01/23/18  4:45 PM  Result Value Ref Range   Salicylate Lvl <0.3 2.8 - 30.0 mg/dL    Comment: Performed at [facility], [place] [address] [place], [place] [address]  CBC with Differential     Status: Abnormal (Preliminary result)   Collection Time: 01/23/18  4:45 PM  Result Value Ref Range   WBC 3.9 (L) 4.0 - 10.5 K/uL   RBC 1.26 (L) 3.87 - 5.11 MIL/uL   Hemoglobin 4.9 (LL) 12.0 - 15.0 g/dL    Comment: This result has been called to [facility] by Rosezella Rumpf on 10 22 2019 at 1818, and has been read back. CRITICAL RESULT VERIFIED   HCT 14.4 (L) 36.0 - 46.0 %   MCV  114.3 (H) 80.0 - 100.0 fL   MCH 38.9 (H) 26.0 - 34.0 pg   MCHC 34.0 30.0 - 36.0 g/dL   RDW 21.9 (H) 11.5 - 15.5 %   Platelets 48 (L) 150 - 400 K/uL    Comment: Immature Platelet Fraction may be clinically indicated, consider ordering this additional test [id number] REPEATED TO VERIFY SPECIMEN CHECKED FOR CLOTS PLATELET COUNT CONFIRMED BY SMEAR    nRBC 0.5 (H) 0.0 - 0.2 %    Comment: Performed at [facility], [place] [address] [place], [place] [address]   Neutrophils Relative % PENDING %   Neutro Abs PENDING 1.7 - 7.7 K/uL   Band Neutrophils PENDING %   Lymphocytes Relative PENDING %   Lymphs Abs PENDING 0.7 - 4.0 K/uL   Monocytes Relative PENDING %   Monocytes Absolute PENDING 0.1 - 1.0 K/uL   Eosinophils Relative PENDING %   Eosinophils Absolute PENDING 0.0 - 0.5 K/uL   Basophils Relative PENDING %   Basophils Absolute PENDING 0.0 - 0.1 K/uL   WBC Morphology PENDING    RBC Morphology PENDING    Smear Review PENDING    Other PENDING %   nRBC PENDING 0 /100 WBC   Metamyelocytes Relative PENDING %   Myelocytes PENDING %   Promyelocytes Relative PENDING %   Blasts PENDING %  Protime-INR     Status: None   Collection Time: 01/23/18  4:45 PM  Result Value Ref Range   Prothrombin Time 13.5 11.4 - 15.2 seconds   INR 1.03     Comment: Performed at [facility], [place] [address], [place], [place] [address]  Bilirubin, direct     Status: Abnormal   Collection Time: 01/23/18  4:45 PM  Result Value Ref Range   Bilirubin, Direct 0.3 (H) 0.0 - 0.2 mg/dL    Comment: Performed at [facility], [place] [address], [place], [place] [address]  Brain natriuretic peptide     Status: Abnormal   Collection Time: 01/23/18  4:45 PM  Result Value Ref Range   B Natriuretic Peptide 157.5 (H) 0.0 - 100.0 pg/mL    Comment: Performed at [facility], [place] [address], [place], [place] [address]  I-stat troponin, ED     Status: None    Collection Time: 01/23/18  4:49 PM  Result Value Ref Range   Troponin i, poc 0.00 0.00 - 0.08 ng/mL  Comment 3            Comment: Due to the release kinetics of cTnI, a negative result within the first hours of the onset of symptoms does not rule out myocardial infarction with certainty. If myocardial infarction is still suspected, repeat the test at appropriate intervals.   Ammonia     Status: None   Collection Time: 01/23/18  5:32 PM  Result Value Ref Range   Ammonia 25 9 - 35 umol/L    Comment: Performed at [facility], [place] [address] [place], [place] [address]  Type and screen [place]     Status: None (Preliminary result)   Collection Time: 01/23/18  6:38 PM  Result Value Ref Range   ABO/RH(D) B POS    Antibody Screen NEG    Sample Expiration 01/26/2018    Unit Number [id number]    Blood Component Type RBC, LR IRR    Unit division 00    Status of Unit ALLOCATED    Transfusion Status OK TO TRANSFUSE    Crossmatch Result      Compatible Performed at [place] [address] [place], [place] [address]    Unit Number [id number]    Blood Component Type RBC, LR IRR    Unit division 00    Status of Unit ALLOCATED    Transfusion Status OK TO TRANSFUSE    Crossmatch Result Compatible   ABO/Rh     Status: None (Preliminary result)   Collection Time: 01/23/18  6:39 PM  Result Value Ref Range   ABO/RH(D)      B POS Performed at [facility], [place] [address], [place], [place] [address]   POC occult blood, ED     Status: None   Collection Time: 01/23/18  6:49 PM  Result Value Ref Range   Fecal Occult Bld NEGATIVE NEGATIVE  Prepare RBC     Status: None   Collection Time: 01/23/18  7:42 PM  Result Value Ref Range   Order Confirmation      ORDER PROCESSED BY BLOOD BANK Performed at [facility], [place] [address] [place], [place] [address]    Dg Chest 2  View  Result Date: 01/23/2018 CLINICAL DATA:  Shortness of breath, jaundice. EXAM: CHEST - 2 VIEW COMPARISON:  CT chest November 20, 2017 FINDINGS: Cardiomediastinal silhouette is normal. Mild bronchitic changes. No pleural effusions or focal consolidations. Trachea projects midline and there is no pneumothorax. Soft tissue planes and included osseous structures are non-suspicious. Faint calcifications in the neck are likely vascular. Thoracolumbar dextroscoliosis. IMPRESSION: Bronchitic changes without focal consolidation. Electronically Signed   By: Elon Alas M.D.   On: 01/23/2018 16:34   EKG Interpretation  Date/Time:  Tuesday January 23 2018 15:06:31 EDT Ventricular Rate:  112 PR Interval:    QRS Duration: 88 QT Interval:  333 QTC Calculation: 455 R Axis:   51 Text Interpretation:  Sinus tachycardia Baseline wander in lead(s) V6 No previous ECGs available Confirmed by Theotis Burrow [phone]) on 01/23/2018 3:56:46 PM   Pending Labs Unresulted Labs (From admission, onward)    Start     Ordered   01/23/18 1925  Vitamin B12  (Anemia Panel (PNL))  Once,   R     01/23/18 1924   01/23/18 1925  Folate  (Anemia Panel (PNL))  Once,   R     01/23/18 1924   01/23/18 1925  Iron and TIBC  (Anemia Panel (  PNL))  Once,   R     01/23/18 1924   01/23/18 1925  Ferritin  (Anemia Panel (PNL))  Once,   R     01/23/18 1924   01/23/18 1925  Reticulocytes  (Anemia Panel (PNL))  Once,   R     01/23/18 1924          Vitals/Pain Today's Vitals   01/23/18 1500 01/23/18 1509 01/23/18 1536 01/23/18 1624  BP: (!) 110/54   (!) 103/41  Pulse: (!) 107     Resp: 16   17  Temp: 99.1 F (37.3 C)     TempSrc: Oral     SpO2: 98%     Weight: 158 lb 2 oz (71.7 kg)     Height: 5' 6"  (1.676 m)     PainSc:  0-No pain 0-No pain     Isolation Precautions No active isolations  Medications Medications  nicotine (NICODERM CQ - dosed in mg/24 hours) patch 21 mg (has no administration in time range)  0.9  %  sodium chloride infusion (10 mL/hr Intravenous New Bag/Given 01/23/18 1953)    Mobility walks

## 2018-01-23 NOTE — ED Provider Notes (Addendum)
[facility] DEPT Provider Note   CSN: [id number] Arrival date & time: 01/23/18  1415     History   Chief Complaint Chief Complaint  Patient presents with  . Shortness of Breath    HPI Jodi Myers is a 67 y.o. female.  67 year old female with history of CAD, tobacco use who presents with shortness of breath.  The patient has had 10 days of progressively worsening shortness of breath that is worse with exertion.  No orthopnea.  No associated chest pain or tightness.  She has noticed some puffiness of her legs, worse in right leg than left.  She notes decreased appetite recently and has lost a few pounds because of it but she has not had any abdominal pain.  She has had a few episodes of vomiting over the past 10 days, approximately 3 in total.  No persistent vomiting and no associated constipation or diarrhea.  No blood in stool.  No urinary symptoms.  2 to 3 days ago daughter noted that her skin began to turn yellow.  She has a chronic smoker's cough but no URI symptoms.  She denies any heavy alcohol use, history of IV drug use, or recent travel.  She takes Excedrin several times per day for many years for her left shoulder pain.  The history is provided by the patient.  Shortness of Breath     Past Medical History:  Diagnosis Date  . Coronary artery disease     Patient Active Problem List   Diagnosis Date Noted  . Pancytopenia ([place]) 01/23/2018  . Symptomatic anemia 01/23/2018  . DVT, lower extremity ([place]) 01/23/2018  . B12 deficiency 01/23/2018    Past Surgical History:  Procedure Laterality Date  . TONSILLECTOMY       OB History   None      Home Medications    Prior to Admission medications   Not on File    Family History Family History  Problem Relation Age of Onset  . Diabetes Mellitus II Mother   . Diabetes Mellitus I Sister   . Colon cancer Neg Hx     Social History Social History   Tobacco Use  . Smoking  status: Current Every Day Smoker    Packs/day: 1.50    Years: 49.00    Pack years: 73.50  . Smokeless tobacco: Never Used  Substance Use Topics  . Alcohol use: Not Currently    Comment: Occasionally.  . Drug use: Never     Allergies   Mango flavor; Poison ivy extract; and Sporanox [itraconazole]   Review of Systems Review of Systems  Respiratory: Positive for shortness of breath.    All other systems reviewed and are negative except that which was mentioned in HPI   Physical Exam Updated Vital Signs BP 121/60   Pulse (!) 101   Temp 98.7 F (37.1 C)   Resp 20   Ht 5\' 5"  (1.651 m)   Wt 70.5 kg   SpO2 99%   BMI 25.86 kg/m   Physical Exam  Constitutional: She is oriented to person, place, and time. She appears well-developed and well-nourished. No distress.  HENT:  Head: Normocephalic and atraumatic.  Mouth/Throat: Oropharynx is clear and moist.  Moist mucous membranes  Eyes:  Pale conjunctivae  Neck: Neck supple. JVD present.  Cardiovascular: Normal rate and regular rhythm.  Murmur heard. Pulmonary/Chest: Effort normal. She has decreased breath sounds. She has no wheezes. She has no rales.  Abdominal: Soft. Bowel sounds are  normal. She exhibits no distension. There is no hepatomegaly. There is no tenderness.  Musculoskeletal:       Right lower leg: She exhibits edema.       Left lower leg: She exhibits edema.  Mild pitting edema bilateral lower extremities right > left  Neurological: She is alert and oriented to person, place, and time.  Fluent speech  Skin: Skin is warm and dry.  Mild jaundice  Psychiatric: She has a normal mood and affect. Judgment normal.  Nursing note and vitals reviewed.    ED Treatments / Results  Labs (all labs ordered are listed, but only abnormal results are displayed) Labs Reviewed  COMPREHENSIVE METABOLIC PANEL - Abnormal; Notable for the following components:      Result Value   Glucose, Bld 112 (*)    AST 46 (*)    Total  Bilirubin 1.5 (*)    All other components within normal limits  ACETAMINOPHEN LEVEL - Abnormal; Notable for the following components:   Acetaminophen (Tylenol), Serum <10 (*)    All other components within normal limits  CBC WITH DIFFERENTIAL/PLATELET - Abnormal; Notable for the following components:   WBC 3.9 (*)    RBC 1.26 (*)    Hemoglobin 4.9 (*)    HCT 14.4 (*)    MCV 114.3 (*)    MCH 38.9 (*)    RDW 21.9 (*)    Platelets 48 (*)    nRBC 0.5 (*)    All other components within normal limits  BILIRUBIN, DIRECT - Abnormal; Notable for the following components:   Bilirubin, Direct 0.3 (*)    All other components within normal limits  BRAIN NATRIURETIC PEPTIDE - Abnormal; Notable for the following components:   B Natriuretic Peptide 157.5 (*)    All other components within normal limits  VITAMIN B12 - Abnormal; Notable for the following components:   Vitamin B-12 <50 (*)    All other components within normal limits  IRON AND TIBC - Abnormal; Notable for the following components:   Iron 299 (*)    Saturation Ratios 89 (*)    All other components within normal limits  RETICULOCYTES - Abnormal; Notable for the following components:   RBC. 1.31 (*)    All other components within normal limits  SALICYLATE LEVEL  PROTIME-INR  AMMONIA  FOLATE  FERRITIN  HIV ANTIBODY (ROUTINE TESTING W REFLEX)  BASIC METABOLIC PANEL  CBC WITH DIFFERENTIAL/PLATELET  INTRINSIC FACTOR ANTIBODIES  ANTI-PARIETAL ANTIBODY  I-STAT TROPONIN, ED  POC OCCULT BLOOD, ED  TYPE AND SCREEN  ABO/RH  PREPARE RBC (CROSSMATCH)    EKG EKG Interpretation  Date/Time:  Tuesday January 23 2018 15:06:31 EDT Ventricular Rate:  112 PR Interval:    QRS Duration: 88 QT Interval:  333 QTC Calculation: 455 R Axis:   51 Text Interpretation:  Sinus tachycardia Baseline wander in lead(s) V6 No previous ECGs available Confirmed by Theotis Burrow [phone]) on 01/23/2018 3:56:46 PM   Radiology Dg Chest 2  View  Result Date: 01/23/2018 CLINICAL DATA:  Shortness of breath, jaundice. EXAM: CHEST - 2 VIEW COMPARISON:  CT chest November 20, 2017 FINDINGS: Cardiomediastinal silhouette is normal. Mild bronchitic changes. No pleural effusions or focal consolidations. Trachea projects midline and there is no pneumothorax. Soft tissue planes and included osseous structures are non-suspicious. Faint calcifications in the neck are likely vascular. Thoracolumbar dextroscoliosis. IMPRESSION: Bronchitic changes without focal consolidation. Electronically Signed   By: Elon Alas M.D.   On: 01/23/2018 16:34  Procedures .Critical Care Performed by: Sharlett Iles, MD Authorized by: Sharlett Iles, MD   Critical care provider statement:    Critical care time (minutes):  45   Critical care time was exclusive of:  Separately billable procedures and treating other patients   Critical care was necessary to treat or prevent imminent or life-threatening deterioration of the following conditions: pancytopenia.   Critical care was time spent personally by me on the following activities:  Development of treatment plan with patient or surrogate, evaluation of patient's response to treatment, examination of patient, obtaining history from patient or surrogate, ordering and performing treatments and interventions, ordering and review of laboratory studies, ordering and review of radiographic studies and re-evaluation of patient's condition   (including critical care time)  Medications Ordered in ED Medications  nicotine (NICODERM CQ - dosed in mg/24 hours) patch 21 mg (21 mg Transdermal Patch Applied 01/23/18 2011)  acetaminophen (TYLENOL) tablet 650 mg (has no administration in time range)    Or  acetaminophen (TYLENOL) suppository 650 mg (has no administration in time range)  ondansetron (ZOFRAN) tablet 4 mg (has no administration in time range)    Or  ondansetron (ZOFRAN) injection 4 mg (has no  administration in time range)  heparin injection 5,000 Units (5,000 Units Subcutaneous Given 01/23/18 2338)  cyanocobalamin ((VITAMIN B-12)) injection 1,000 mcg (1,000 mcg Subcutaneous Given 01/23/18 2338)  sodium chloride 0.9 % injection (has no administration in time range)  iopamidol (ISOVUE-370) 76 % injection (has no administration in time range)  iopamidol (ISOVUE-370) 76 % injection 100 mL (has no administration in time range)  0.9 %  sodium chloride infusion (10 mL/hr Intravenous New Bag/Given 01/23/18 1953)     Initial Impression / Assessment and Plan / ED Course  I have reviewed the triage vital signs and the nursing notes.  Pertinent labs & imaging results that were available during my care of the patient were reviewed by me and considered in my medical decision making (see chart for details).    She was comfortable on exam but did have jaundice, JVD, and systolic murmur.  Differential diagnosis is very broad and includes heart failure, liver failure due to cancer or due to chronic tylenol toxicity, or PE. EKG shows sinus tachycardia, borderline ST depression in inferior leads but no reciprocal changes and no previous for comparison.  Chest x-ray negative acute.   Lab work shows multiple derangements including WBC 3.9, hemoglobin 4.9, platelets 48,000, normal creatinine, reassuring LFTs, total bilirubin 1.5.  Hemoccult negative and patient without any history of melena or hematochezia.  It is certainly possible for her to have chronic GI bleed from chronic Excedrin use but given the pancytopenia I am more concerned about another process such as malignancy.  Added on anemia panel.  Obtained consent for transfusion and ordered 3 units PRBCs.  Because of leg swelling, obtained right lower extremity ultrasound which was positive for DVT.  I am concerned about her pancytopenia and I have deferred anticoagulation to primary medicine team.  Added on CTA chest as well as CT of abdomen and  pelvis for ruling out PE as well as some malignancy.  Discussed admission with hospitalist, Dr. Hal Hope, and patient admitted for further care. Final Clinical Impressions(s) / ED Diagnoses   Final diagnoses:  None    ED Discharge Orders    None       Anjana Cheek, Wenda Overland, MD 01/24/18 4401    Rex Kras Wenda Overland, MD 01/24/18 [phone]

## 2018-01-24 ENCOUNTER — Observation Stay (HOSPITAL_COMMUNITY): Payer: Medicare Other

## 2018-01-24 ENCOUNTER — Other Ambulatory Visit: Payer: Self-pay

## 2018-01-24 ENCOUNTER — Encounter (HOSPITAL_COMMUNITY): Payer: Self-pay

## 2018-01-24 DIAGNOSIS — F17210 Nicotine dependence, cigarettes, uncomplicated: Secondary | ICD-10-CM | POA: Diagnosis not present

## 2018-01-24 DIAGNOSIS — D649 Anemia, unspecified: Principal | ICD-10-CM

## 2018-01-24 DIAGNOSIS — R0602 Shortness of breath: Secondary | ICD-10-CM | POA: Diagnosis not present

## 2018-01-24 DIAGNOSIS — D510 Vitamin B12 deficiency anemia due to intrinsic factor deficiency: Secondary | ICD-10-CM

## 2018-01-24 DIAGNOSIS — I82441 Acute embolism and thrombosis of right tibial vein: Secondary | ICD-10-CM | POA: Diagnosis not present

## 2018-01-24 DIAGNOSIS — N200 Calculus of kidney: Secondary | ICD-10-CM | POA: Diagnosis not present

## 2018-01-24 DIAGNOSIS — E538 Deficiency of other specified B group vitamins: Secondary | ICD-10-CM | POA: Diagnosis not present

## 2018-01-24 DIAGNOSIS — R17 Unspecified jaundice: Secondary | ICD-10-CM | POA: Diagnosis not present

## 2018-01-24 DIAGNOSIS — D61818 Other pancytopenia: Secondary | ICD-10-CM

## 2018-01-24 DIAGNOSIS — K760 Fatty (change of) liver, not elsewhere classified: Secondary | ICD-10-CM | POA: Diagnosis not present

## 2018-01-24 DIAGNOSIS — I824Z1 Acute embolism and thrombosis of unspecified deep veins of right distal lower extremity: Secondary | ICD-10-CM | POA: Diagnosis not present

## 2018-01-24 LAB — CBC WITH DIFFERENTIAL/PLATELET
ABS IMMATURE GRANULOCYTES: 0.1 10*3/uL — AB (ref 0.00–0.07)
Basophils Absolute: 0 10*3/uL (ref 0.0–0.1)
Basophils Relative: 0 %
Eosinophils Absolute: 0.3 10*3/uL (ref 0.0–0.5)
Eosinophils Relative: 6 %
HEMATOCRIT: 24.8 % — AB (ref 36.0–46.0)
HEMOGLOBIN: 8.6 g/dL — AB (ref 12.0–15.0)
Immature Granulocytes: 2 %
LYMPHS ABS: 1 10*3/uL (ref 0.7–4.0)
LYMPHS PCT: 24 %
MCH: 34.4 pg — ABNORMAL HIGH (ref 26.0–34.0)
MCHC: 34.7 g/dL (ref 30.0–36.0)
MCV: 99.2 fL (ref 80.0–100.0)
MONO ABS: 0.2 10*3/uL (ref 0.1–1.0)
Monocytes Relative: 4 %
NEUTROS ABS: 2.6 10*3/uL (ref 1.7–7.7)
Neutrophils Relative %: 64 %
Platelets: 39 10*3/uL — ABNORMAL LOW (ref 150–400)
RBC: 2.5 MIL/uL — AB (ref 3.87–5.11)
RDW: 21.5 % — AB (ref 11.5–15.5)
WBC: 4.1 10*3/uL (ref 4.0–10.5)
nRBC: 0.5 % — ABNORMAL HIGH (ref 0.0–0.2)

## 2018-01-24 LAB — BASIC METABOLIC PANEL
ANION GAP: 6 (ref 5–15)
BUN: 13 mg/dL (ref 8–23)
CO2: 30 mmol/L (ref 22–32)
Calcium: 8.6 mg/dL — ABNORMAL LOW (ref 8.9–10.3)
Chloride: 103 mmol/L (ref 98–111)
Creatinine, Ser: 0.78 mg/dL (ref 0.44–1.00)
GFR calc Af Amer: >60 mL/min (ref >60)
GFR calc non Af Amer: >60 mL/min (ref >60)
GLUCOSE: 120 mg/dL — AB (ref 70–99)
Potassium: 3.5 mmol/L (ref 3.5–5.1)
Sodium: 139 mmol/L (ref 135–145)

## 2018-01-24 LAB — ABO/RH: ABO/RH(D): B POS

## 2018-01-24 MED ORDER — NICOTINE 21 MG/24HR TD PT24
21.0000 mg | MEDICATED_PATCH | Freq: Every day | TRANSDERMAL | Status: DC
  Administered 2018-01-24: 21 mg via TRANSDERMAL
  Filled 2018-01-24: qty 1

## 2018-01-24 MED ORDER — CYANOCOBALAMIN 1000 MCG/ML IJ SOLN: INTRAMUSCULAR | 0 refills | Status: DC

## 2018-01-24 MED ORDER — IOPAMIDOL (ISOVUE-370) INJECTION 76%
100.0000 mL | Freq: Once | INTRAVENOUS | Status: AC | PRN
  Administered 2018-01-24: 100 mL via INTRAVENOUS

## 2018-01-24 MED ORDER — RIVAROXABAN 20 MG PO TABS: 20.0000 mg | ORAL_TABLET | Freq: Every day | ORAL | Status: DC

## 2018-01-24 MED ORDER — RIVAROXABAN (XARELTO) VTE STARTER PACK (15 & 20 MG): ORAL_TABLET | ORAL | 0 refills | Status: DC

## 2018-01-24 MED ORDER — RIVAROXABAN 15 MG PO TABS
15.0000 mg | ORAL_TABLET | Freq: Two times a day (BID) | ORAL | Status: DC
  Administered 2018-01-24 (×2): 15 mg via ORAL
  Filled 2018-01-24 (×2): qty 1

## 2018-01-24 MED ORDER — ACETAMINOPHEN 325 MG PO TABS: 650.0000 mg | ORAL_TABLET | Freq: Four times a day (QID) | ORAL | 0 refills | Status: DC | PRN

## 2018-01-24 NOTE — Progress Notes (Signed)
HEMATOLOGY/ONCOLOGY CONSULTATION NOTE  Date of Service: 01/24/2018  Patient Care Team: Hilbert Bible as PCP - General (Physician Assistant)  CHIEF COMPLAINTS/PURPOSE OF CONSULTATION:  Pancytopenia   HISTORY OF PRESENTING ILLNESS:   Jodi Myers is a wonderful 67 y.o. female who has been referred to [place] by Dr. Cathlean Sauer for evaluation and management of Pancytopenia. She is accompanied today by her daughter. The pt reports that she is doing well overall.   The pt reports that she first began feeling unwell 10 days ago, beginning to feel fatigued and SOB. The pt notes that she was eating well until a few days ago, and denies any dietary restrictions. Denies thyroid problems. She recently began calcium and Vitamin D replacement for osteoporosis. She notes that her weight has been stable as have her bowel movements. She denies any fevers, chills, night sweats or unexpected weight loss  The pt notes that her ankle was swollen for the past few days, and after her VAS [place] she was placed on Xarelto.   The pt notes that her SOB has completely resolved. The pt notes that her fingers felt numb yesterday which is new for her and denies any neuropathy in her feet.   The pt had a VAS [place] on 01/23/18 which revealed Right: findings consistent with acute DVT involving the right posterior tibial vein  Most recent lab results (01/23/18) of CBC w/diff and BMP is as follows: all values are WNL except for RBC at 2.50, HGB at 8.6, HCT at 24.8, MCH at 34.4, RDW at 21.5, PLT at 39k, nRBC at 0.5%, Glucose at 120, Calcium at 8.6 01/23/18 Vitamin B12 was low at <50  On review of systems, pt reports resolving fatigue, resolved SOB, recently numb fingers, stable weight, and denies blood in the stools, blood in the urine, nose bleeds, gum bleeds, abnormal vaginal bleeding, changes in vision, changes in memory, headaches, fevers, chills, night sweats, neuropathy in feet, balance issues, abdominal pains, and any  other symptoms.   On PMHx the pt reports osteoporosis. On Social Hx the pt reports smoking 1-2 packs of cigarettes each day On Family Hx the pt reports mother with heart issues and rheumatic heart disease, sister with Hashimoto's thyroiditis and DM type I  MEDICAL HISTORY:  Past Medical History:  Diagnosis Date  . Coronary artery disease     SURGICAL HISTORY: Past Surgical History:  Procedure Laterality Date  . TONSILLECTOMY      SOCIAL HISTORY: Social History   Socioeconomic History  . Marital status: Married    Spouse name: Not on file  . Number of children: Not on file  . Years of education: Not on file  . Highest education level: Not on file  Occupational History  . Not on file  Social Needs  . Financial resource strain: Not on file  . Food insecurity:    Worry: Not on file    Inability: Not on file  . Transportation needs:    Medical: Not on file    Non-medical: Not on file  Tobacco Use  . Smoking status: Current Every Day Smoker    Packs/day: 1.50    Years: 49.00    Pack years: 73.50  . Smokeless tobacco: Never Used  Substance and Sexual Activity  . Alcohol use: Not Currently    Comment: Occasionally.  . Drug use: Never  . Sexual activity: Not on file  Lifestyle  . Physical activity:    Days per week: Not on file  Minutes per session: Not on file  . Stress: Not on file  Relationships  . Social connections:    Talks on phone: Not on file    Gets together: Not on file    Attends religious service: Not on file    Active member of club or organization: Not on file    Attends meetings of clubs or organizations: Not on file    Relationship status: Not on file  . Intimate partner violence:    Fear of current or ex partner: Not on file    Emotionally abused: Not on file    Physically abused: Not on file    Forced sexual activity: Not on file  Other Topics Concern  . Not on file  Social History Narrative  . Not on file    FAMILY HISTORY: Family  History  Problem Relation Age of Onset  . Diabetes Mellitus II Mother   . Diabetes Mellitus I Sister   . Colon cancer Neg Hx     ALLERGIES:  is allergic to mango flavor; poison ivy extract; and sporanox [itraconazole].  MEDICATIONS:  Current Facility-Administered Medications  Medication Dose Route Frequency Provider Last Rate Last Dose  . acetaminophen (TYLENOL) tablet 650 mg  650 mg Oral Q6H PRN [name], MD       Or  . acetaminophen (TYLENOL) suppository 650 mg  650 mg Rectal Q6H PRN [name], MD      . cyanocobalamin ((VITAMIN B-12)) injection 1,000 mcg  1,000 mcg Subcutaneous Daily [name], MD   1,000 mcg at 01/23/18 2338  . heparin injection 5,000 Units  5,000 Units Subcutaneous Q8H [name], MD   5,000 Units at 01/23/18 2338  . iopamidol (ISOVUE-370) 76 % injection           . nicotine (NICODERM CQ - dosed in mg/24 hours) patch 21 mg  21 mg Transdermal Once Little, Wenda Overland, MD   21 mg at 01/23/18 2011  . ondansetron (ZOFRAN) tablet 4 mg  4 mg Oral Q6H PRN [name], MD       Or  . ondansetron [facility]) injection 4 mg  4 mg Intravenous Q6H PRN [name], MD      . sodium chloride 0.9 % injection             REVIEW OF SYSTEMS:    10 Point review of Systems was done is negative except as noted above.  PHYSICAL EXAMINATION:  . Vitals:   01/24/18 0635 01/24/18 0905  BP: (!) 113/56 (!) 124/57  Pulse: 89 75  Resp: 18 18  Temp: 99.3 F (37.4 C) 98.4 F (36.9 C)  SpO2: 100% 100%   Filed Weights   01/23/18 1500 01/23/18 2039 01/24/18 0629  Weight: 158 lb 2 oz (71.7 kg) 155 lb 6.8 oz (70.5 kg) 160 lb 7.9 oz (72.8 kg)   .Body mass index is 26.71 kg/m.  GENERAL:alert, in no acute distress and comfortable SKIN: no acute rashes, no significant lesions EYES: conjunctiva are pink and non-injected, sclera anicteric OROPHARYNX: MMM, no exudates, no oropharyngeal erythema or ulceration NECK: supple, no  JVD LYMPH:  no palpable lymphadenopathy in the cervical, axillary or inguinal regions LUNGS: clear to auscultation b/l with normal respiratory effort HEART: regular rate & rhythm ABDOMEN:  normoactive bowel sounds , non tender, not distended. Extremity: no pedal edema PSYCH: alert & oriented x 3 with fluent speech NEURO: no focal motor/sensory deficits  LABORATORY DATA:  I have reviewed  the data as listed  . CBC Latest Ref Rng & Units 01/24/2018 01/23/2018  WBC 4.0 - 10.5 K/uL 4.1 3.9(L)  Hemoglobin 12.0 - 15.0 g/dL 8.6(L) 4.9(LL)  Hematocrit 36.0 - 46.0 % 24.8(L) 14.4(L)  Platelets 150 - 400 K/uL 39(L) 48(L)    . CMP Latest Ref Rng & Units 01/24/2018 01/23/2018  Glucose 70 - 99 mg/dL 120(H) 112(H)  BUN 8 - 23 mg/dL 13 18  Creatinine 0.44 - 1.00 mg/dL 0.78 0.78  Sodium 135 - 145 mmol/L 139 141  Potassium 3.5 - 5.1 mmol/L 3.5 3.6  Chloride 98 - 111 mmol/L 103 105  CO2 22 - 32 mmol/L 30 27  Calcium 8.9 - 10.3 mg/dL 8.6(L) 9.8  Total Protein 6.5 - 8.1 g/dL - 6.9  Total Bilirubin 0.3 - 1.2 mg/dL - 1.5(H)  Alkaline Phos 38 - 126 U/L - 58  AST 15 - 41 U/L - 46(H)  ALT 0 - 44 U/L - 12   Component     Latest Ref Rng & Units 01/23/2018 01/24/2018  Iron     28 - 170 ug/dL 299 (H)   TIBC     250 - 450 ug/dL 335   Saturation Ratios     10.4 - 31.8 % 89 (H)   UIBC     ug/dL 36   Retic Ct Pct     0.4 - 3.1 % 2.4   RBC.     3.87 - 5.11 MIL/uL 1.31 (L)   Retic Count, Absolute     19.0 - 186.0 K/uL 31.7   Immature Retic Fract     2.3 - 15.9 % 12.3   Vitamin B12     180 - 914 pg/mL <50 (L)   Folate     >5.9 ng/mL 7.5   Ferritin     11 - 307 ng/mL 66   Intrinsic Factor     0.0 - 1.1 AU/mL  18.2 (H)  Parietal Cell Antibody-IgG     0.0 - 20.0 Units  70.1 (H)    RADIOGRAPHIC STUDIES: I have personally reviewed the radiological images as listed and agreed with the findings in the report. Dg Chest 2 View  Result Date: 01/23/2018 CLINICAL DATA:  Shortness of breath,  jaundice. EXAM: CHEST - 2 VIEW COMPARISON:  CT chest November 20, 2017 FINDINGS: Cardiomediastinal silhouette is normal. Mild bronchitic changes. No pleural effusions or focal consolidations. Trachea projects midline and there is no pneumothorax. Soft tissue planes and included osseous structures are non-suspicious. Faint calcifications in the neck are likely vascular. Thoracolumbar dextroscoliosis. IMPRESSION: Bronchitic changes without focal consolidation. Electronically Signed   By: Elon Alas M.D.   On: 01/23/2018 16:34   Ct Angio Chest Pe W/cm &/or Wo Cm  Result Date: 01/24/2018 CLINICAL DATA:  Shortness of breath, jaundice EXAM: CT ANGIOGRAPHY CHEST CT ABDOMEN AND PELVIS WITH CONTRAST TECHNIQUE: Multidetector CT imaging of the chest was performed using the standard protocol during bolus administration of intravenous contrast. Multiplanar CT image reconstructions and MIPs were obtained to evaluate the vascular anatomy. Multidetector CT imaging of the abdomen and pelvis was performed using the standard protocol during bolus administration of intravenous contrast. CONTRAST:  130mL ISOVUE-370 IOPAMIDOL (ISOVUE-370) INJECTION 76% COMPARISON:  Chest CT 11/20/2017 FINDINGS: CTA CHEST FINDINGS Cardiovascular: No filling defects in the pulmonary arteries to suggest pulmonary emboli. Insert Heart scattered aortic and coronary artery calcifications. Mediastinum/Nodes: Calcified subcarinal lymph nodes. No mediastinal, hilar, or axillary adenopathy. Lungs/Pleura: Lungs are clear. No focal airspace opacities or suspicious nodules.  No effusions. Musculoskeletal: No acute bony abnormality. Review of the MIP images confirms the above findings. CT ABDOMEN and PELVIS FINDINGS Hepatobiliary: Fatty infiltration of the liver. No focal abnormality or biliary ductal dilatation. Gallbladder unremarkable. Pancreas: No focal abnormality or ductal dilatation. Spleen: Calcifications in the spleen compatible with old  granulomatous disease. Spleen is enlarged with a craniocaudal length of 15.3 cm. Adrenals/Urinary Tract: 2.1 cm cyst in the midpole of the left kidney. 8.5 cm septated cyst in the right kidney. No cough CT cm layering within this large cyst. There are multiple stones noted within the right renal collecting system, the largest 10 mm. No hydronephrosis. Scarring and cortical loss in the lower pole of the right kidney. Adrenal glands and urinary bladder unremarkable. Stomach/Bowel: Stomach, large and small bowel grossly unremarkable. Vascular/Lymphatic: Aorta and iliac vessels are heavily calcified. No aneurysm or adenopathy. Reproductive: Uterus and adnexa unremarkable.  No mass. Other: No free fluid or free air. Musculoskeletal: Advanced degenerative disc disease throughout the lumbar spine. No acute bony abnormality. Scoliosis. Review of the MIP images confirms the above findings. IMPRESSION: No evidence of pulmonary embolus. Coronary artery disease. Fatty infiltration of the liver with hepatosplenomegaly. Large septated cyst within the right kidney. Cortical thinning and scarring within the right kidney. Right nephrolithiasis. No hydronephrosis. Old granulomatous disease in the spleen and chest. Aortic atherosclerosis. Abdominal aorta is heavily calcified. No aneurysm. Electronically Signed   By: Rolm Baptise M.D.   On: 01/24/2018 01:09   Ct Abdomen Pelvis W Contrast  Result Date: 01/24/2018 CLINICAL DATA:  Shortness of breath, jaundice EXAM: CT ANGIOGRAPHY CHEST CT ABDOMEN AND PELVIS WITH CONTRAST TECHNIQUE: Multidetector CT imaging of the chest was performed using the standard protocol during bolus administration of intravenous contrast. Multiplanar CT image reconstructions and MIPs were obtained to evaluate the vascular anatomy. Multidetector CT imaging of the abdomen and pelvis was performed using the standard protocol during bolus administration of intravenous contrast. CONTRAST:  19mL ISOVUE-370  IOPAMIDOL (ISOVUE-370) INJECTION 76% COMPARISON:  Chest CT 11/20/2017 FINDINGS: CTA CHEST FINDINGS Cardiovascular: No filling defects in the pulmonary arteries to suggest pulmonary emboli. Insert Heart scattered aortic and coronary artery calcifications. Mediastinum/Nodes: Calcified subcarinal lymph nodes. No mediastinal, hilar, or axillary adenopathy. Lungs/Pleura: Lungs are clear. No focal airspace opacities or suspicious nodules. No effusions. Musculoskeletal: No acute bony abnormality. Review of the MIP images confirms the above findings. CT ABDOMEN and PELVIS FINDINGS Hepatobiliary: Fatty infiltration of the liver. No focal abnormality or biliary ductal dilatation. Gallbladder unremarkable. Pancreas: No focal abnormality or ductal dilatation. Spleen: Calcifications in the spleen compatible with old granulomatous disease. Spleen is enlarged with a craniocaudal length of 15.3 cm. Adrenals/Urinary Tract: 2.1 cm cyst in the midpole of the left kidney. 8.5 cm septated cyst in the right kidney. No cough CT cm layering within this large cyst. There are multiple stones noted within the right renal collecting system, the largest 10 mm. No hydronephrosis. Scarring and cortical loss in the lower pole of the right kidney. Adrenal glands and urinary bladder unremarkable. Stomach/Bowel: Stomach, large and small bowel grossly unremarkable. Vascular/Lymphatic: Aorta and iliac vessels are heavily calcified. No aneurysm or adenopathy. Reproductive: Uterus and adnexa unremarkable.  No mass. Other: No free fluid or free air. Musculoskeletal: Advanced degenerative disc disease throughout the lumbar spine. No acute bony abnormality. Scoliosis. Review of the MIP images confirms the above findings. IMPRESSION: No evidence of pulmonary embolus. Coronary artery disease. Fatty infiltration of the liver with hepatosplenomegaly. Large septated cyst within the right kidney.  Cortical thinning and scarring within the right kidney. Right  nephrolithiasis. No hydronephrosis. Old granulomatous disease in the spleen and chest. Aortic atherosclerosis. Abdominal aorta is heavily calcified. No aneurysm. Electronically Signed   By: Rolm Baptise M.D.   On: 01/24/2018 01:09    ASSESSMENT & PLAN:  67 y.o. female with  1. Pancytopenia likely due to severe B12 deficiency 2. Severe B12 deficiency due to pernicious anemia +ve parietal cell and Intrinsic factor antibodies. 2. Acute DVT rt posterior tibial veins. Likely triggering event - 2 packs/day of cigarette smoking.  PLAN:  -Discussed patient's most recent labs from 01/23/18, Vitamin B12 low at <50. S/p blood transfusion the pt's HGB increased from 4.9 to 8.6.  -Discussed the 01/23/18 VAS [place] which revealed  Right: findings consistent with acute DVT involving the right posterior tibial vein -Evaluating for pernicious anemia, 01/24/18 anti-parietal antibody and intrinsic factor antibody are both elevated consistent with pernicious anemia. -B12  1036mcg daily x 7 days then monthly. -Begin Vitamin B complex 1 cap po daily  -Pt notes that she is not inclined to pursue a colonoscopy or upper endoscopy, but will being consider this - would recommend she pursue these with her PCP -Would recommend thyroid levels checked due to potential association with pernicious anemia -Will see the pt in clinic in followup in 1 month -counseled on absolute smoking cessation -would recommend anticoagulation for 6 months followed by Aspirin.   All of the patients questions were answered with apparent satisfaction. The patient knows to call the clinic with any problems, questions or concerns.  The total time spent in the appt was 80 minutes and more than 50% was on counseling and direct patient cares.    Sullivan Lone MD MS AAHIVMS [facility] Hematology[facility]Oncology [facility]  (Office):       [phone] (Work cell):  [phone] (Fax):           [phone]  01/24/2018 9:29  AM  I, Baldwin [place], am acting as a scribe for Dr. Irene Limbo  .I have reviewed the above documentation for accuracy and completeness, and I agree with the above. Sullivan Lone MD MS

## 2018-01-24 NOTE — Discharge Instructions (Signed)
Information on my medicine - XARELTO (rivaroxaban)  This medication education was reviewed with me or my healthcare representative as part of my discharge preparation.  The pharmacist that spoke with me during my hospital stay was:    WHY WAS XARELTO PRESCRIBED FOR YOU? Xarelto was prescribed to treat blood clots that may have been found in the veins of your legs (deep vein thrombosis) or in your lungs (pulmonary embolism) and to reduce the risk of them occurring again.  What do you need to know about Xarelto? The starting dose is one 15 mg tablet taken TWICE daily with food for the FIRST 21 DAYS then on (enter date)  11/13  the dose is changed to one 20 mg tablet taken ONCE A DAY with your evening meal.  DO NOT stop taking Xarelto without talking to the health care provider who prescribed the medication.  Refill your prescription for 20 mg tablets before you run out.  After discharge, you should have regular check-up appointments with your healthcare provider that is prescribing your Xarelto.  In the future your dose may need to be changed if your kidney function changes by a significant amount.  What do you do if you miss a dose? If you are taking Xarelto TWICE DAILY and you miss a dose, take it as soon as you remember. You may take two 15 mg tablets (total 30 mg) at the same time then resume your regularly scheduled 15 mg twice daily the next day.  If you are taking Xarelto ONCE DAILY and you miss a dose, take it as soon as you remember on the same day then continue your regularly scheduled once daily regimen the next day. Do not take two doses of Xarelto at the same time.   Important Safety Information Xarelto is a blood thinner medicine that can cause bleeding. You should call your healthcare provider right away if you experience any of the following: ? Bleeding from an injury or your nose that does not stop. ? Unusual colored urine (red or dark brown) or unusual colored stools  (red or black). ? Unusual bruising for unknown reasons. ? A serious fall or if you hit your head (even if there is no bleeding).  Some medicines may interact with Xarelto and might increase your risk of bleeding while on Xarelto. To help avoid this, consult your healthcare provider or pharmacist prior to using any new prescription or non-prescription medications, including herbals, vitamins, non-steroidal anti-inflammatory drugs (NSAIDs) and supplements.  This website has more information on Xarelto: [email or web address]

## 2018-01-24 NOTE — Discharge Summary (Signed)
Physician Discharge Summary  Jodi Myers KZS:[id number] DOB: 04-Jun-1950 DOA: 01/23/2018  PCP: Jodi Harrow, PA-C  Admit date: 01/23/2018 Discharge date: 01/24/2018  Admitted From: Home  Disposition:  Home   Recommendations for Outpatient Follow-up and new medication changes:  1. Follow up with [facility] in 7 days 2. Patient has been placed on Rivaroxaban for acute deep vein thrombosis 3. Follow up with hematology.  4. Follow up with gastroenterology for possible EGD. 5. Intrinsic factor and anti-parietal factor antibodies pending.  6. Continue B12 injection 1000 mcg daily for one week and then weekly for 8 weeks.   Home Health: no   Equipment/Devices: no    Discharge Condition: stable  CODE STATUS: full  Diet recommendation: heart healthy   Brief/Interim Summary: 67 year old female who presented with dyspnea.  Denies any significant past medical history.  Reported dyspnea for the last 10 days prior to hospitalization, associated with mild nausea but no vomiting.  On the initial physical examination blood pressure 103/41, heart rate 100, respiratory 22, temperature 100.7, oxygen saturation 95%.  Patient was pale, no icterus, moist mucous membranes, lungs clear to auscultation bilaterally, heart S1-S2 present rhythmic, abdomen soft nontender, no lower extremity edema.  Sodium 141, potassium 3.6, chloride 105, bicarb 27, glucose 112, BUN 18, creatinine 0.78, calcium 9.8, AST 46, ALT 12, white count 3.9, hemoglobin 4.9, hematocrit 14.4, platelets 48, INR 1.0, fecal occult blood test was negative.  CT chest with no pulmonary embolism, fatty infiltration of the liver with hepatosplenomegaly, large septated cyst within the right kidney, right nephrolithiasis, no hydronephrosis.  Chest x-ray negative for infiltrates.  EKG normal sinus rhythm, normal axis normal intervals.  Lower extremities doppler ultrasonography with acute deep vein thrombosis involving the right posterior tibial vein,  no DVT in the left.   Patient was admitted to the hospital with the working diagnosis of acute symptomatic anemia complicated by acute left lower extremity deep vein thrombosis  1.  Acute symptomatic anemia/ pancytopenia, due to B12 deficiency.  Patient was admitted to the medical ward, she was placed on a remote telemetry monitor, her B12 level was found less than 50.  Folic acid was 7.5, and her iron stores had a ferritin of 66, transferrin saturation of 89, TIBC 335 and serum iron 299.  Antibodies against intrinsic factor and antiparietal cells, are pending. She received 3 units of packed red blood cells, with good toleration, her discharge hemoglobin is 8.6, hematocrit 24.8. Patient received 1000 micrograms of B12 injection subcutaneously.  Will need outpatient follow-up likely with upper endoscopy.  2.  Acute deep vein thrombosis of the posterior tibial vein.  Patient was placed on anticoagulation with rivaroxaban, close follow-up on platelet function.  3.  Tobacco abuse.  Smoking cessation.  Patient received nicotine patch while hospitalized.  Discharge Diagnoses:  Principal Problem:   Symptomatic anemia Active Problems:   Pancytopenia (HCC)   DVT, lower extremity ([place])   B12 deficiency    Discharge Instructions   Allergies as of 01/24/2018      Reactions   Mango Flavor    Blisters   Poison Ivy Extract    Blisters   Sporanox [itraconazole]    Blisters      Medication List    TAKE these medications   acetaminophen 325 MG tablet Commonly known as:  TYLENOL Take 2 tablets (650 mg total) by mouth every 6 (six) hours as needed for mild pain (or Fever >/= 101).   cyanocobalamin 1000 MCG/ML injection Commonly known as:  (VITAMIN  B-12) Daily for 7 days, then weekly for 8 weeks.   Rivaroxaban 15 & 20 MG Tbpk Take as directed on package: Start with one 15mg  tablet by mouth twice a day with food. On Day 22, switch to one 20mg  tablet once a day with food.       Allergies   Allergen Reactions  . Mango Flavor     Blisters  . Poison Ivy Extract     Blisters  . Sporanox [Itraconazole]     Blisters    Consultations:  Hematology    Procedures/Studies: Dg Chest 2 View  Result Date: 01/23/2018 CLINICAL DATA:  Shortness of breath, jaundice. EXAM: CHEST - 2 VIEW COMPARISON:  CT chest November 20, 2017 FINDINGS: Cardiomediastinal silhouette is normal. Mild bronchitic changes. No pleural effusions or focal consolidations. Trachea projects midline and there is no pneumothorax. Soft tissue planes and included osseous structures are non-suspicious. Faint calcifications in the neck are likely vascular. Thoracolumbar dextroscoliosis. IMPRESSION: Bronchitic changes without focal consolidation. Electronically Signed   By: Elon Alas M.D.   On: 01/23/2018 16:34   Ct Angio Chest Pe W/cm &/or Wo Cm  Result Date: 01/24/2018 CLINICAL DATA:  Shortness of breath, jaundice EXAM: CT ANGIOGRAPHY CHEST CT ABDOMEN AND PELVIS WITH CONTRAST TECHNIQUE: Multidetector CT imaging of the chest was performed using the standard protocol during bolus administration of intravenous contrast. Multiplanar CT image reconstructions and MIPs were obtained to evaluate the vascular anatomy. Multidetector CT imaging of the abdomen and pelvis was performed using the standard protocol during bolus administration of intravenous contrast. CONTRAST:  124mL ISOVUE-370 IOPAMIDOL (ISOVUE-370) INJECTION 76% COMPARISON:  Chest CT 11/20/2017 FINDINGS: CTA CHEST FINDINGS Cardiovascular: No filling defects in the pulmonary arteries to suggest pulmonary emboli. Insert Heart scattered aortic and coronary artery calcifications. Mediastinum/Nodes: Calcified subcarinal lymph nodes. No mediastinal, hilar, or axillary adenopathy. Lungs/Pleura: Lungs are clear. No focal airspace opacities or suspicious nodules. No effusions. Musculoskeletal: No acute bony abnormality. Review of the MIP images confirms the above findings. CT  ABDOMEN and PELVIS FINDINGS Hepatobiliary: Fatty infiltration of the liver. No focal abnormality or biliary ductal dilatation. Gallbladder unremarkable. Pancreas: No focal abnormality or ductal dilatation. Spleen: Calcifications in the spleen compatible with old granulomatous disease. Spleen is enlarged with a craniocaudal length of 15.3 cm. Adrenals/Urinary Tract: 2.1 cm cyst in the midpole of the left kidney. 8.5 cm septated cyst in the right kidney. No cough CT cm layering within this large cyst. There are multiple stones noted within the right renal collecting system, the largest 10 mm. No hydronephrosis. Scarring and cortical loss in the lower pole of the right kidney. Adrenal glands and urinary bladder unremarkable. Stomach/Bowel: Stomach, large and small bowel grossly unremarkable. Vascular/Lymphatic: Aorta and iliac vessels are heavily calcified. No aneurysm or adenopathy. Reproductive: Uterus and adnexa unremarkable.  No mass. Other: No free fluid or free air. Musculoskeletal: Advanced degenerative disc disease throughout the lumbar spine. No acute bony abnormality. Scoliosis. Review of the MIP images confirms the above findings. IMPRESSION: No evidence of pulmonary embolus. Coronary artery disease. Fatty infiltration of the liver with hepatosplenomegaly. Large septated cyst within the right kidney. Cortical thinning and scarring within the right kidney. Right nephrolithiasis. No hydronephrosis. Old granulomatous disease in the spleen and chest. Aortic atherosclerosis. Abdominal aorta is heavily calcified. No aneurysm. Electronically Signed   By: Rolm Baptise M.D.   On: 01/24/2018 01:09   Ct Abdomen Pelvis W Contrast  Result Date: 01/24/2018 CLINICAL DATA:  Shortness of breath, jaundice EXAM: CT ANGIOGRAPHY  CHEST CT ABDOMEN AND PELVIS WITH CONTRAST TECHNIQUE: Multidetector CT imaging of the chest was performed using the standard protocol during bolus administration of intravenous contrast. Multiplanar  CT image reconstructions and MIPs were obtained to evaluate the vascular anatomy. Multidetector CT imaging of the abdomen and pelvis was performed using the standard protocol during bolus administration of intravenous contrast. CONTRAST:  151mL ISOVUE-370 IOPAMIDOL (ISOVUE-370) INJECTION 76% COMPARISON:  Chest CT 11/20/2017 FINDINGS: CTA CHEST FINDINGS Cardiovascular: No filling defects in the pulmonary arteries to suggest pulmonary emboli. Insert Heart scattered aortic and coronary artery calcifications. Mediastinum/Nodes: Calcified subcarinal lymph nodes. No mediastinal, hilar, or axillary adenopathy. Lungs/Pleura: Lungs are clear. No focal airspace opacities or suspicious nodules. No effusions. Musculoskeletal: No acute bony abnormality. Review of the MIP images confirms the above findings. CT ABDOMEN and PELVIS FINDINGS Hepatobiliary: Fatty infiltration of the liver. No focal abnormality or biliary ductal dilatation. Gallbladder unremarkable. Pancreas: No focal abnormality or ductal dilatation. Spleen: Calcifications in the spleen compatible with old granulomatous disease. Spleen is enlarged with a craniocaudal length of 15.3 cm. Adrenals/Urinary Tract: 2.1 cm cyst in the midpole of the left kidney. 8.5 cm septated cyst in the right kidney. No cough CT cm layering within this large cyst. There are multiple stones noted within the right renal collecting system, the largest 10 mm. No hydronephrosis. Scarring and cortical loss in the lower pole of the right kidney. Adrenal glands and urinary bladder unremarkable. Stomach/Bowel: Stomach, large and small bowel grossly unremarkable. Vascular/Lymphatic: Aorta and iliac vessels are heavily calcified. No aneurysm or adenopathy. Reproductive: Uterus and adnexa unremarkable.  No mass. Other: No free fluid or free air. Musculoskeletal: Advanced degenerative disc disease throughout the lumbar spine. No acute bony abnormality. Scoliosis. Review of the MIP images confirms the  above findings. IMPRESSION: No evidence of pulmonary embolus. Coronary artery disease. Fatty infiltration of the liver with hepatosplenomegaly. Large septated cyst within the right kidney. Cortical thinning and scarring within the right kidney. Right nephrolithiasis. No hydronephrosis. Old granulomatous disease in the spleen and chest. Aortic atherosclerosis. Abdominal aorta is heavily calcified. No aneurysm. Electronically Signed   By: Rolm Baptise M.D.   On: 01/24/2018 01:09       Subjective: Patient is feeling better after PRBC transfusion, no nausea or vomiting, tolerating po well.   Discharge Exam: Vitals:   01/24/18 0635 01/24/18 0905  BP: (!) 113/56 (!) 124/57  Pulse: 89 75  Resp: 18 18  Temp: 99.3 F (37.4 C) 98.4 F (36.9 C)  SpO2: 100% 100%   Vitals:   01/24/18 0616 01/24/18 0629 01/24/18 0635 01/24/18 0905  BP: (!) 108/52  (!) 113/56 (!) 124/57  Pulse: 89  89 75  Resp: 18  18 18   Temp: 99.1 F (37.3 C)  99.3 F (37.4 C) 98.4 F (36.9 C)  TempSrc: Oral  Oral Oral  SpO2: 99%  100% 100%  Weight:  72.8 kg    Height:        General: not in pain or dyspnea  Neurology: Awake and alert, non focal  E ENT: mild pallor, no icterus, oral mucosa moist Cardiovascular: No JVD. S1-S2 present, rhythmic, no gallops, rubs, or murmurs. No lower extremity edema. Pulmonary: positive breath sounds bilaterally, adequate air movement, no wheezing, rhonchi or rales. Gastrointestinal. Abdomen with no organomegaly, non tender, no rebound or guarding Skin. No rashes Musculoskeletal: no joint deformities   The results of significant diagnostics from this hospitalization (including imaging, microbiology, ancillary and laboratory) are listed below for reference.  Microbiology: No results found for this or any previous visit (from the past 240 hour(s)).   Labs: BNP (last 3 results) Recent Labs    01/23/18 1645  BNP 443.1*   Basic Metabolic Panel: Recent Labs  Lab  01/23/18 1645 01/24/18 1108  NA 141 139  K 3.6 3.5  CL 105 103  CO2 27 30  GLUCOSE 112* 120*  BUN 18 13  CREATININE 0.78 0.78  CALCIUM 9.8 8.6*   Liver Function Tests: Recent Labs  Lab 01/23/18 1645  AST 46*  ALT 12  ALKPHOS 58  BILITOT 1.5*  PROT 6.9  ALBUMIN 3.9   No results for input(s): LIPASE, AMYLASE in the last 168 hours. Recent Labs  Lab 01/23/18 1732  AMMONIA 25   CBC: Recent Labs  Lab 01/23/18 1645 01/24/18 1108  WBC 3.9* 4.1  NEUTROABS 2.4 PENDING  HGB 4.9* 8.6*  HCT 14.4* 24.8*  MCV 114.3* 99.2  PLT 48* 39*   Cardiac Enzymes: No results for input(s): CKTOTAL, CKMB, CKMBINDEX, TROPONINI in the last 168 hours. BNP: Invalid input(s): POCBNP CBG: No results for input(s): GLUCAP in the last 168 hours. D-Dimer No results for input(s): DDIMER in the last 72 hours. Hgb A1c No results for input(s): HGBA1C in the last 72 hours. Lipid Profile No results for input(s): CHOL, HDL, LDLCALC, TRIG, CHOLHDL, LDLDIRECT in the last 72 hours. Thyroid function studies No results for input(s): TSH, T4TOTAL, T3FREE, THYROIDAB in the last 72 hours.  Invalid input(s): FREET3 Anemia work up Recent Labs    01/23/18 1942  VITAMINB12 <50*  FOLATE 7.5  FERRITIN 66  TIBC 335  IRON 299*  RETICCTPCT 2.4   Urinalysis No results found for: COLORURINE, APPEARANCEUR, LABSPEC, PHURINE, GLUCOSEU, HGBUR, BILIRUBINUR, KETONESUR, PROTEINUR, UROBILINOGEN, NITRITE, LEUKOCYTESUR Sepsis Labs Invalid input(s): PROCALCITONIN,  WBC,  LACTICIDVEN Microbiology No results found for this or any previous visit (from the past 240 hour(s)).   Time coordinating discharge: 45 minutes  SIGNED:   Tawni Millers, MD  [facility] 01/24/2018, 12:15 PM Pager [phone]  If 7PM-7AM, please contact night-coverage [email or web address] Password [email or web address]

## 2018-01-24 NOTE — Progress Notes (Signed)
[facility] for Xarelto  Indication: DVT  Allergies  Allergen Reactions  . Mango Flavor     Blisters  . Poison Ivy Extract     Blisters  . Sporanox [Itraconazole]     Blisters   Patient Measurements: Height: 5\' 5"  (165.1 cm) Weight: 160 lb 7.9 oz (72.8 kg) IBW/kg (Calculated) : 57  Vital Signs: Temp: 98.4 F (36.9 C) (10/23 0905) Temp Source: Oral (10/23 0905) BP: 124/57 (10/23 0905) Pulse Rate: 75 (10/23 0905)  Labs: Recent Labs    01/23/18 1645  HGB 4.9*  HCT 14.4*  PLT 48*  LABPROT 13.5  INR 1.03  CREATININE 0.78   Estimated Creatinine Clearance: 67.3 mL/min (by C-G formula based on SCr of 0.78 mg/dL).  Medical History: Past Medical History:  Diagnosis Date  . Coronary artery disease    Medications:  Scheduled:  . cyanocobalamin  1,000 mcg Subcutaneous Daily  . iopamidol      . nicotine  21 mg Transdermal Once  . sodium chloride       Assessment: 37 yoF with ShOB, admit Hgb 4.9, no blood in stool, plan transfuse, RLE DVT Heparin 5000 units SQ q8hr begun 10/22 pm per Onc MD with pancytopenia, transition to Xarelto for DVT treatment.  Goal of Therapy:  Monitor platelets by anticoagulation protocol: Yes   Plan:   Xarelto 15mg  bid x 21 days, followed by 20mg  daily  Transfusing, B12 supplementation  Minda Ditto PharmD Pager [phone] 01/24/2018, 10:11 AM

## 2018-01-24 NOTE — Care Management Note (Signed)
Case Management Note  Patient Details  Name: Jodi Myers MRN: [id number] Date of Birth: [birth year]  Subjective/Objective:                    Action/Plan:   Expected Discharge Date:  (unknown)               Expected Discharge Plan:  Home/Self Care  In-House Referral:     Discharge planning Services  CM Consult  Post Acute Care Choice:    Choice offered to:     DME Arranged:    DME Agency:     HH Arranged:    [place] Agency:     Status of Service:  Completed, signed off  If discussed at [employer] of Stay Meetings, dates discussed:    Additional CommentsPurcell Mouton, RN 01/24/2018, 4:37 PM

## 2018-01-25 LAB — INTRINSIC FACTOR ANTIBODIES: Intrinsic Factor: 18.2 AU/mL — ABNORMAL HIGH (ref 0.0–1.1)

## 2018-01-25 LAB — ANTI-PARIETAL ANTIBODY: PARIETAL CELL ANTIBODY-IGG: 70.1 U — AB (ref 0.0–20.0)

## 2018-01-25 LAB — HIV ANTIBODY (ROUTINE TESTING W REFLEX): HIV Screen 4th Generation wRfx: NONREACTIVE

## 2018-01-26 ENCOUNTER — Telehealth: Payer: Self-pay | Admitting: Hematology

## 2018-01-26 DIAGNOSIS — D510 Vitamin B12 deficiency anemia due to intrinsic factor deficiency: Secondary | ICD-10-CM

## 2018-01-26 NOTE — Telephone Encounter (Signed)
A hospital follow up appt has been scheduled for the pt to see Dr. Irene Limbo on 11/19 at 10am. Appt date and time has been given to the pt's daughter.

## 2018-01-27 LAB — BPAM RBC
BLOOD PRODUCT EXPIRATION DATE: 201911112359
Blood Product Expiration Date: 201911192359
Blood Product Expiration Date: 201911192359
Blood Product Expiration Date: 201911192359
ISSUE DATE / TIME: 201910222228
ISSUE DATE / TIME: 201910230221
ISSUE DATE / TIME: 201910230608
UNIT TYPE AND RH: 5100
Unit Type and Rh: 5100
Unit Type and Rh: 7300
Unit Type and Rh: 7300

## 2018-01-27 LAB — TYPE AND SCREEN
ABO/RH(D): B POS
ANTIBODY SCREEN: NEGATIVE
Unit division: 0
Unit division: 0
Unit division: 0
Unit division: 0

## 2018-01-29 ENCOUNTER — Ambulatory Visit: Payer: Medicare Other | Admitting: Physician Assistant

## 2018-01-31 DIAGNOSIS — D510 Vitamin B12 deficiency anemia due to intrinsic factor deficiency: Secondary | ICD-10-CM | POA: Diagnosis not present

## 2018-01-31 DIAGNOSIS — Z23 Encounter for immunization: Secondary | ICD-10-CM | POA: Diagnosis not present

## 2018-01-31 DIAGNOSIS — Z720 Tobacco use: Secondary | ICD-10-CM | POA: Diagnosis not present

## 2018-01-31 DIAGNOSIS — I824Z9 Acute embolism and thrombosis of unspecified deep veins of unspecified distal lower extremity: Secondary | ICD-10-CM | POA: Diagnosis not present

## 2018-01-31 DIAGNOSIS — Z6825 Body mass index (BMI) 25.0-25.9, adult: Secondary | ICD-10-CM | POA: Diagnosis not present

## 2018-02-08 DIAGNOSIS — D510 Vitamin B12 deficiency anemia due to intrinsic factor deficiency: Secondary | ICD-10-CM | POA: Diagnosis not present

## 2018-02-08 DIAGNOSIS — D649 Anemia, unspecified: Secondary | ICD-10-CM | POA: Diagnosis not present

## 2018-02-08 DIAGNOSIS — Z86718 Personal history of other venous thrombosis and embolism: Secondary | ICD-10-CM | POA: Diagnosis not present

## 2018-02-08 DIAGNOSIS — R740 Nonspecific elevation of levels of transaminase and lactic acid dehydrogenase [LDH]: Secondary | ICD-10-CM | POA: Diagnosis not present

## 2018-02-19 ENCOUNTER — Other Ambulatory Visit: Payer: Self-pay | Admitting: *Deleted

## 2018-02-19 ENCOUNTER — Encounter (INDEPENDENT_AMBULATORY_CARE_PROVIDER_SITE_OTHER): Payer: Medicare Other | Admitting: Ophthalmology

## 2018-02-19 DIAGNOSIS — H2513 Age-related nuclear cataract, bilateral: Secondary | ICD-10-CM | POA: Diagnosis not present

## 2018-02-19 DIAGNOSIS — H442A3 Degenerative myopia with choroidal neovascularization, bilateral eye: Secondary | ICD-10-CM

## 2018-02-19 DIAGNOSIS — H43813 Vitreous degeneration, bilateral: Secondary | ICD-10-CM | POA: Diagnosis not present

## 2018-02-19 DIAGNOSIS — D61818 Other pancytopenia: Secondary | ICD-10-CM

## 2018-02-19 DIAGNOSIS — D649 Anemia, unspecified: Secondary | ICD-10-CM

## 2018-02-19 NOTE — Progress Notes (Signed)
HEMATOLOGY/ONCOLOGY CLINIC NOTE  Date of Service: 02/20/2018  Patient Care Team: Kathyrn Lass, MD as PCP - General (Family Medicine)  CHIEF COMPLAINTS/PURPOSE OF CONSULTATION:  Pancytopenia   HISTORY OF PRESENTING ILLNESS:   Jodi Myers is a wonderful 67 y.o. female who has been referred to [place] by Dr. Cathlean Sauer for evaluation and management of Pancytopenia. She is accompanied today by her daughter. The pt reports that she is doing well overall.   The pt reports that she first began feeling unwell 10 days ago, beginning to feel fatigued and SOB. The pt notes that she was eating well until a few days ago, and denies any dietary restrictions. Denies thyroid problems. She recently began calcium and Vitamin D replacement for osteoporosis. She notes that her weight has been stable as have her bowel movements. She denies any fevers, chills, night sweats or unexpected weight loss  The pt notes that her ankle was swollen for the past few days, and after her VAS [place] she was placed on Xarelto.   The pt notes that her SOB has completely resolved. The pt notes that her fingers felt numb yesterday which is new for her and denies any neuropathy in her feet.   The pt had a VAS [place] on 01/23/18 which revealed Right: findings consistent with acute DVT involving the right posterior tibial vein  Most recent lab results (01/23/18) of CBC w/diff and BMP is as follows: all values are WNL except for RBC at 2.50, HGB at 8.6, HCT at 24.8, MCH at 34.4, RDW at 21.5, PLT at 39k, nRBC at 0.5%, Glucose at 120, Calcium at 8.6 01/23/18 Vitamin B12 was low at <50  On review of systems, pt reports resolving fatigue, resolved SOB, recently numb fingers, stable weight, and denies blood in the stools, blood in the urine, nose bleeds, gum bleeds, abnormal vaginal bleeding, changes in vision, changes in memory, headaches, fevers, chills, night sweats, neuropathy in feet, balance issues, abdominal pains, and any other symptoms.    On PMHx the pt reports osteoporosis. On Social Hx the pt reports smoking 1-2 packs of cigarettes each day On Family Hx the pt reports mother with heart issues and rheumatic heart disease, sister with Hashimoto's thyroiditis and DM type I  Interval History:  Jodi Myers returns today for management and evaluation of her pernicious anemia and DVT. I last saw the patient on 01/24/18 as an inpatient. The pt reports that she is doing well overall.   The pt reports that she has continued with weekly Vitamin B12 injections as planned for four weeks. She notes that her energy levels are very much improved.  The pt notes that she has marginally decreased smoking cigarettes from 2 packs each day to 1.5 ppd.    She notes that her previous right leg swelling from her acute DVT has resolved. She denies her leg swelling when she is walking around.   The pt will be seeing [facility] in January for continued work up.   Lab results today (02/20/18) of CBC w/diff and CMP is as follows: all values are WNL except for HGB at 11.7, RDW at 16.1, Albumin at 3.4, AST at 14. 02/20/18 Ferritin is pending 02/20/18 Iron and TIBC shows all values WNL except for Saturation Ratio at 16%  On review of systems, pt reports much improved energy levels, and denies leg swelling, difficulty breathing, abdominal pains, and any other symptoms.   MEDICAL HISTORY:  Past Medical History:  Diagnosis Date  . Coronary artery  disease     SURGICAL HISTORY: Past Surgical History:  Procedure Laterality Date  . TONSILLECTOMY      SOCIAL HISTORY: Social History   Socioeconomic History  . Marital status: Married    Spouse name: Not on file  . Number of children: Not on file  . Years of education: Not on file  . Highest education level: Not on file  Occupational History  . Not on file  Social Needs  . Financial resource strain: Not on file  . Food insecurity:    Worry: Not on file    Inability: Not on file  .  Transportation needs:    Medical: Not on file    Non-medical: Not on file  Tobacco Use  . Smoking status: Current Every Day Smoker    Packs/day: 1.50    Years: 49.00    Pack years: 73.50  . Smokeless tobacco: Never Used  Substance and Sexual Activity  . Alcohol use: Not Currently    Comment: Occasionally.  . Drug use: Never  . Sexual activity: Not on file  Lifestyle  . Physical activity:    Days per week: Not on file    Minutes per session: Not on file  . Stress: Not on file  Relationships  . Social connections:    Talks on phone: Not on file    Gets together: Not on file    Attends religious service: Not on file    Active member of club or organization: Not on file    Attends meetings of clubs or organizations: Not on file    Relationship status: Not on file  . Intimate partner violence:    Fear of current or ex partner: Not on file    Emotionally abused: Not on file    Physically abused: Not on file    Forced sexual activity: Not on file  Other Topics Concern  . Not on file  Social History Narrative  . Not on file    FAMILY HISTORY: Family History  Problem Relation Age of Onset  . Diabetes Mellitus II Mother   . Diabetes Mellitus I Sister   . Colon cancer Neg Hx     ALLERGIES:  is allergic to mango flavor; poison ivy extract; and sporanox [itraconazole].  MEDICATIONS:  Current Outpatient Medications  Medication Sig Dispense Refill  . acetaminophen (TYLENOL) 325 MG tablet Take 2 tablets (650 mg total) by mouth every 6 (six) hours as needed for mild pain (or Fever >/= 101). 20 tablet 0  . cyanocobalamin (,VITAMIN B-12,) 1000 MCG/ML injection Daily for 7 days, then weekly for 8 weeks. 1 mL 0  . Rivaroxaban 15 & 20 MG TBPK Take as directed on package: Start with one 15mg  tablet by mouth twice a day with food. On Day 22, switch to one 20mg  tablet once a day with food. 51 each 0   No current facility-administered medications for this visit.     REVIEW OF SYSTEMS:     A 10+ POINT REVIEW OF SYSTEMS WAS OBTAINED including neurology, dermatology, psychiatry, cardiac, respiratory, lymph, extremities, GI, GU, Musculoskeletal, constitutional, breasts, reproductive, HEENT.  All pertinent positives are noted in the HPI.  All others are negative.   PHYSICAL EXAMINATION:  . Vitals:   02/20/18 1008  BP: 121/62  Pulse: 70  Resp: 20  Temp: 98.3 F (36.8 C)  SpO2: 100%   Filed Weights   02/20/18 1008  Weight: 149 lb 12.8 oz (67.9 kg)   .Body mass index is 24.93  kg/m.  GENERAL:alert, in no acute distress and comfortable SKIN: no acute rashes, no significant lesions EYES: conjunctiva are pink and non-injected, sclera anicteric OROPHARYNX: MMM, no exudates, no oropharyngeal erythema or ulceration NECK: supple, no JVD LYMPH:  no palpable lymphadenopathy in the cervical, axillary or inguinal regions LUNGS: clear to auscultation b/l with normal respiratory effort HEART: regular rate & rhythm ABDOMEN:  normoactive bowel sounds , non tender, not distended. No palpable hepatosplenomegaly.  Extremity: no pedal edema PSYCH: alert & oriented x 3 with fluent speech NEURO: no focal motor/sensory deficits   LABORATORY DATA:  I have reviewed the data as listed  . CBC Latest Ref Rng & Units 02/20/2018 01/24/2018 01/23/2018  WBC 4.0 - 10.5 K/uL 6.3 4.1 3.9(L)  Hemoglobin 12.0 - 15.0 g/dL 11.7(L) 8.6(L) 4.9(LL)  Hematocrit 36.0 - 46.0 % 39.0 24.8(L) 14.4(L)  Platelets 150 - 400 K/uL 181 39(L) 48(L)    . CMP Latest Ref Rng & Units 02/20/2018 01/24/2018 01/23/2018  Glucose 70 - 99 mg/dL 93 120(H) 112(H)  BUN 8 - 23 mg/dL 18 13 18   Creatinine 0.44 - 1.00 mg/dL 0.89 0.78 0.78  Sodium 135 - 145 mmol/L 141 139 141  Potassium 3.5 - 5.1 mmol/L 3.9 3.5 3.6  Chloride 98 - 111 mmol/L 106 103 105  CO2 22 - 32 mmol/L 26 30 27   Calcium 8.9 - 10.3 mg/dL 9.1 8.6(L) 9.8  Total Protein 6.5 - 8.1 g/dL 6.9 - 6.9  Total Bilirubin 0.3 - 1.2 mg/dL 0.4 - 1.5(H)  Alkaline  Phos 38 - 126 U/L 95 - 58  AST 15 - 41 U/L 14(L) - 46(H)  ALT 0 - 44 U/L 8 - 12   Component     Latest Ref Rng & Units 01/23/2018 01/24/2018  Iron     28 - 170 ug/dL 299 (H)   TIBC     250 - 450 ug/dL 335   Saturation Ratios     10.4 - 31.8 % 89 (H)   UIBC     ug/dL 36   Retic Ct Pct     0.4 - 3.1 % 2.4   RBC.     3.87 - 5.11 MIL/uL 1.31 (L)   Retic Count, Absolute     19.0 - 186.0 K/uL 31.7   Immature Retic Fract     2.3 - 15.9 % 12.3   Vitamin B12     180 - 914 pg/mL <50 (L)   Folate     >5.9 ng/mL 7.5   Ferritin     11 - 307 ng/mL 66   Intrinsic Factor     0.0 - 1.1 AU/mL  18.2 (H)  Parietal Cell Antibody-IgG     0.0 - 20.0 Units  70.1 (H)    RADIOGRAPHIC STUDIES: I have personally reviewed the radiological images as listed and agreed with the findings in the report. Dg Chest 2 View  Result Date: 01/23/2018 CLINICAL DATA:  Shortness of breath, jaundice. EXAM: CHEST - 2 VIEW COMPARISON:  CT chest November 20, 2017 FINDINGS: Cardiomediastinal silhouette is normal. Mild bronchitic changes. No pleural effusions or focal consolidations. Trachea projects midline and there is no pneumothorax. Soft tissue planes and included osseous structures are non-suspicious. Faint calcifications in the neck are likely vascular. Thoracolumbar dextroscoliosis. IMPRESSION: Bronchitic changes without focal consolidation. Electronically Signed   By: Elon Alas M.D.   On: 01/23/2018 16:34   Ct Angio Chest Pe W/cm &/or Wo Cm  Result Date: 01/24/2018 CLINICAL DATA:  Shortness of breath,  jaundice EXAM: CT ANGIOGRAPHY CHEST CT ABDOMEN AND PELVIS WITH CONTRAST TECHNIQUE: Multidetector CT imaging of the chest was performed using the standard protocol during bolus administration of intravenous contrast. Multiplanar CT image reconstructions and MIPs were obtained to evaluate the vascular anatomy. Multidetector CT imaging of the abdomen and pelvis was performed using the standard protocol during  bolus administration of intravenous contrast. CONTRAST:  165mL ISOVUE-370 IOPAMIDOL (ISOVUE-370) INJECTION 76% COMPARISON:  Chest CT 11/20/2017 FINDINGS: CTA CHEST FINDINGS Cardiovascular: No filling defects in the pulmonary arteries to suggest pulmonary emboli. Insert Heart scattered aortic and coronary artery calcifications. Mediastinum/Nodes: Calcified subcarinal lymph nodes. No mediastinal, hilar, or axillary adenopathy. Lungs/Pleura: Lungs are clear. No focal airspace opacities or suspicious nodules. No effusions. Musculoskeletal: No acute bony abnormality. Review of the MIP images confirms the above findings. CT ABDOMEN and PELVIS FINDINGS Hepatobiliary: Fatty infiltration of the liver. No focal abnormality or biliary ductal dilatation. Gallbladder unremarkable. Pancreas: No focal abnormality or ductal dilatation. Spleen: Calcifications in the spleen compatible with old granulomatous disease. Spleen is enlarged with a craniocaudal length of 15.3 cm. Adrenals/Urinary Tract: 2.1 cm cyst in the midpole of the left kidney. 8.5 cm septated cyst in the right kidney. No cough CT cm layering within this large cyst. There are multiple stones noted within the right renal collecting system, the largest 10 mm. No hydronephrosis. Scarring and cortical loss in the lower pole of the right kidney. Adrenal glands and urinary bladder unremarkable. Stomach/Bowel: Stomach, large and small bowel grossly unremarkable. Vascular/Lymphatic: Aorta and iliac vessels are heavily calcified. No aneurysm or adenopathy. Reproductive: Uterus and adnexa unremarkable.  No mass. Other: No free fluid or free air. Musculoskeletal: Advanced degenerative disc disease throughout the lumbar spine. No acute bony abnormality. Scoliosis. Review of the MIP images confirms the above findings. IMPRESSION: No evidence of pulmonary embolus. Coronary artery disease. Fatty infiltration of the liver with hepatosplenomegaly. Large septated cyst within the right  kidney. Cortical thinning and scarring within the right kidney. Right nephrolithiasis. No hydronephrosis. Old granulomatous disease in the spleen and chest. Aortic atherosclerosis. Abdominal aorta is heavily calcified. No aneurysm. Electronically Signed   By: Rolm Baptise M.D.   On: 01/24/2018 01:09   Ct Abdomen Pelvis W Contrast  Result Date: 01/24/2018 CLINICAL DATA:  Shortness of breath, jaundice EXAM: CT ANGIOGRAPHY CHEST CT ABDOMEN AND PELVIS WITH CONTRAST TECHNIQUE: Multidetector CT imaging of the chest was performed using the standard protocol during bolus administration of intravenous contrast. Multiplanar CT image reconstructions and MIPs were obtained to evaluate the vascular anatomy. Multidetector CT imaging of the abdomen and pelvis was performed using the standard protocol during bolus administration of intravenous contrast. CONTRAST:  156mL ISOVUE-370 IOPAMIDOL (ISOVUE-370) INJECTION 76% COMPARISON:  Chest CT 11/20/2017 FINDINGS: CTA CHEST FINDINGS Cardiovascular: No filling defects in the pulmonary arteries to suggest pulmonary emboli. Insert Heart scattered aortic and coronary artery calcifications. Mediastinum/Nodes: Calcified subcarinal lymph nodes. No mediastinal, hilar, or axillary adenopathy. Lungs/Pleura: Lungs are clear. No focal airspace opacities or suspicious nodules. No effusions. Musculoskeletal: No acute bony abnormality. Review of the MIP images confirms the above findings. CT ABDOMEN and PELVIS FINDINGS Hepatobiliary: Fatty infiltration of the liver. No focal abnormality or biliary ductal dilatation. Gallbladder unremarkable. Pancreas: No focal abnormality or ductal dilatation. Spleen: Calcifications in the spleen compatible with old granulomatous disease. Spleen is enlarged with a craniocaudal length of 15.3 cm. Adrenals/Urinary Tract: 2.1 cm cyst in the midpole of the left kidney. 8.5 cm septated cyst in the right kidney. No cough CT  cm layering within this large cyst. There are  multiple stones noted within the right renal collecting system, the largest 10 mm. No hydronephrosis. Scarring and cortical loss in the lower pole of the right kidney. Adrenal glands and urinary bladder unremarkable. Stomach/Bowel: Stomach, large and small bowel grossly unremarkable. Vascular/Lymphatic: Aorta and iliac vessels are heavily calcified. No aneurysm or adenopathy. Reproductive: Uterus and adnexa unremarkable.  No mass. Other: No free fluid or free air. Musculoskeletal: Advanced degenerative disc disease throughout the lumbar spine. No acute bony abnormality. Scoliosis. Review of the MIP images confirms the above findings. IMPRESSION: No evidence of pulmonary embolus. Coronary artery disease. Fatty infiltration of the liver with hepatosplenomegaly. Large septated cyst within the right kidney. Cortical thinning and scarring within the right kidney. Right nephrolithiasis. No hydronephrosis. Old granulomatous disease in the spleen and chest. Aortic atherosclerosis. Abdominal aorta is heavily calcified. No aneurysm. Electronically Signed   By: Rolm Baptise M.D.   On: 01/24/2018 01:09   Vas [place] Lower Extremity Venous (dvt) (only Mc & Wl 7a-7p)  Result Date: 01/23/2018  Lower Venous Study Indications: SOB.  Performing Technologist: Lorina Rabon  Examination Guidelines: A complete evaluation includes B-mode imaging, spectral Doppler, color Doppler, and power Doppler as needed of all accessible portions of each vessel. Bilateral testing is considered an integral part of a complete examination. Limited examinations for reoccurring indications may be performed as noted.  Right Venous Findings: +---------+---------------+---------+-----------+----------+-------+          CompressibilityPhasicitySpontaneityPropertiesSummary +---------+---------------+---------+-----------+----------+-------+ CFV      Full           Yes      Yes                           +---------+---------------+---------+-----------+----------+-------+ SFJ      Full                                                 +---------+---------------+---------+-----------+----------+-------+ FV Prox  Full                                                 +---------+---------------+---------+-----------+----------+-------+ FV Mid   Full                                                 +---------+---------------+---------+-----------+----------+-------+ FV DistalFull                                                 +---------+---------------+---------+-----------+----------+-------+ PFV      Full                                                 +---------+---------------+---------+-----------+----------+-------+ POP      Full           Yes  Yes                          +---------+---------------+---------+-----------+----------+-------+ PTV      None                                         Acute   +---------+---------------+---------+-----------+----------+-------+ PERO     Full                                                 +---------+---------------+---------+-----------+----------+-------+  Left Venous Findings: +---+---------------+---------+-----------+----------+-------+    CompressibilityPhasicitySpontaneityPropertiesSummary +---+---------------+---------+-----------+----------+-------+ CFVFull           Yes      Yes                          +---+---------------+---------+-----------+----------+-------+    Summary: Right: Findings consistent with acute deep vein thrombosis involving the right posterior tibial vein. Left: No evidence of common femoral vein obstruction.  *See table(s) above for measurements and observations. Electronically signed by Harold Barban MD on 01/23/2018 at 10:42:46 PM.    Final     ASSESSMENT & PLAN:  67 y.o. female with  1. Pancytopenia likely due to severe B12 deficiency 2. Severe B12 deficiency due  to pernicious anemia +ve parietal cell and Intrinsic factor antibodies. 2. Acute DVT rt posterior tibial veins. Likely triggering event - 2 packs/day of cigarette smoking.  01/23/18 VAS US revealed  Right: findings consistent with acute DVT involving the right posterior tibial vein   PLAN:  -Patient's labs upon initial presentation 01/23/18, Vitamin B12 low at <50. S/p blood transfusion the pt's HGB increased from 4.9 to 8.6.  -01/24/18 anti-parietal antibody and intrinsic factor antibody are both elevated consistent with pernicious anemia. -Continue Vitamin B complex 1 cap po daily  -Pt notes that she is not inclined to pursue a colonoscopy or upper endoscopy, but will being consider this - would recommend she pursue these with her PCP -Would recommend thyroid levels checked due to potential association with pernicious anemia -Discussed pt labwork today, 02/20/18; HGB much improved to 11.7, PLT normalized, WBC normalized, blood counts and chemistries are otherwise stable  -Continue Vitamin B12 [place] weekly for four total weeks, then once a month -Continued to counsel the pt toward complete smoking cessation and discussed that this remains an ongoing risk factor for repeated blood clots in the future -Continue Xarelto for a total of 6 months and then transition to 81mg  aspirin. However, if primary risk factor of clot is not able to be addressed (continued smoking), may be a role to continue anticoagulation.  -Continue follow up with PCP Dr. Sabra Heck for long term monthly B12 injections  -Continue follow up with GI -Will see the pt back in 6 months    RTC with Dr Irene Limbo in 6 months with labs   All of the patients questions were answered with apparent satisfaction. The patient knows to call the clinic with any problems, questions or concerns.  The total time spent in the appt was 25 minutes and more than 50% was on counseling and direct patient cares.    Sullivan Lone MD [place] AAHIVMS [facility]  [facility] Hematology/Oncology Physician [place]  (Office):  [phone] (Work cell):  [phone] (Fax):           [phone]  02/20/2018 11:12 AM  I, Baldwin [place], am acting as a scribe for Dr. Sullivan Lone.   .I have reviewed the above documentation for accuracy and completeness, and I agree with the above. Brunetta Genera MD

## 2018-02-20 ENCOUNTER — Telehealth: Payer: Self-pay | Admitting: Hematology

## 2018-02-20 ENCOUNTER — Encounter: Payer: Self-pay | Admitting: Hematology

## 2018-02-20 ENCOUNTER — Inpatient Hospital Stay: Payer: Medicare Other | Attending: Hematology | Admitting: Hematology

## 2018-02-20 ENCOUNTER — Inpatient Hospital Stay: Payer: Medicare Other

## 2018-02-20 VITALS — BP 121/62 | HR 70 | Temp 98.3°F | Resp 20 | Ht 65.0 in | Wt 149.8 lb

## 2018-02-20 DIAGNOSIS — D61818 Other pancytopenia: Secondary | ICD-10-CM | POA: Insufficient documentation

## 2018-02-20 DIAGNOSIS — E538 Deficiency of other specified B group vitamins: Secondary | ICD-10-CM | POA: Insufficient documentation

## 2018-02-20 DIAGNOSIS — I700 Atherosclerosis of aorta: Secondary | ICD-10-CM | POA: Insufficient documentation

## 2018-02-20 DIAGNOSIS — D649 Anemia, unspecified: Secondary | ICD-10-CM

## 2018-02-20 DIAGNOSIS — D510 Vitamin B12 deficiency anemia due to intrinsic factor deficiency: Secondary | ICD-10-CM | POA: Insufficient documentation

## 2018-02-20 DIAGNOSIS — I2510 Atherosclerotic heart disease of native coronary artery without angina pectoris: Secondary | ICD-10-CM | POA: Insufficient documentation

## 2018-02-20 DIAGNOSIS — F17210 Nicotine dependence, cigarettes, uncomplicated: Secondary | ICD-10-CM | POA: Diagnosis not present

## 2018-02-20 LAB — CBC WITH DIFFERENTIAL (CANCER CENTER ONLY)
ABS IMMATURE GRANULOCYTES: 0.01 10*3/uL (ref 0.00–0.07)
BASOS ABS: 0.1 10*3/uL (ref 0.0–0.1)
Basophils Relative: 1 %
Eosinophils Absolute: 0.3 10*3/uL (ref 0.0–0.5)
Eosinophils Relative: 4 %
HCT: 39 % (ref 36.0–46.0)
HEMOGLOBIN: 11.7 g/dL — AB (ref 12.0–15.0)
Immature Granulocytes: 0 %
LYMPHS PCT: 22 %
Lymphs Abs: 1.4 10*3/uL (ref 0.7–4.0)
MCH: 29.2 pg (ref 26.0–34.0)
MCHC: 30 g/dL (ref 30.0–36.0)
MCV: 97.3 fL (ref 80.0–100.0)
Monocytes Absolute: 0.6 10*3/uL (ref 0.1–1.0)
Monocytes Relative: 10 %
NEUTROS ABS: 4 10*3/uL (ref 1.7–7.7)
NEUTROS PCT: 63 %
NRBC: 0 % (ref 0.0–0.2)
Platelet Count: 181 10*3/uL (ref 150–400)
RBC: 4.01 MIL/uL (ref 3.87–5.11)
RDW: 16.1 % — ABNORMAL HIGH (ref 11.5–15.5)
WBC: 6.3 10*3/uL (ref 4.0–10.5)

## 2018-02-20 LAB — IRON AND TIBC
Iron: 60 ug/dL (ref 41–142)
SATURATION RATIOS: 16 % — AB (ref 21–57)
TIBC: 371 ug/dL (ref 236–444)
UIBC: 311 ug/dL (ref 120–384)

## 2018-02-20 LAB — CMP (CANCER CENTER ONLY)
ALBUMIN: 3.4 g/dL — AB (ref 3.5–5.0)
ALK PHOS: 95 U/L (ref 38–126)
ALT: 8 U/L (ref 0–44)
ANION GAP: 9 (ref 5–15)
AST: 14 U/L — ABNORMAL LOW (ref 15–41)
BUN: 18 mg/dL (ref 8–23)
CALCIUM: 9.1 mg/dL (ref 8.9–10.3)
CO2: 26 mmol/L (ref 22–32)
Chloride: 106 mmol/L (ref 98–111)
Creatinine: 0.89 mg/dL (ref 0.44–1.00)
GFR, Est AFR Am: >60 mL/min (ref >60)
GFR, Estimated: >60 mL/min (ref >60)
GLUCOSE: 93 mg/dL (ref 70–99)
Potassium: 3.9 mmol/L (ref 3.5–5.1)
SODIUM: 141 mmol/L (ref 135–145)
Total Bilirubin: 0.4 mg/dL (ref 0.3–1.2)
Total Protein: 6.9 g/dL (ref 6.5–8.1)

## 2018-02-20 LAB — FERRITIN: FERRITIN: 10 ng/mL — AB (ref 11–307)

## 2018-02-20 NOTE — Patient Instructions (Signed)
Thank you for choosing [facility] to provide your oncology and hematology care.  To afford each patient quality time with our providers, please arrive 30 minutes before your scheduled appointment time.  If you arrive late for your appointment, you may be asked to reschedule.  We strive to give you quality time with our providers, and arriving late affects you and other patients whose appointments are after yours.    If you are a no show for multiple scheduled visits, you may be dismissed from the clinic at the providers discretion.     Again, thank you for choosing [facility], our hope is that these requests will decrease the amount of time that you wait before being seen by our physicians.  ______________________________________________________________________   Should you have questions after your visit to the  Cancer Center, please contact our office at [phone] between the hours of 8:30 and 4:30 p.m.    Voicemails left after 4:30p.m will not be returned until the following business day.     For prescription refill requests, please have your pharmacy contact us directly.  Please also try to allow 48 hours for prescription requests.     Please contact the scheduling department for questions regarding scheduling.  For scheduling of procedures such as PET scans, CT scans, MRI, Ultrasound, etc please contact central scheduling at [phone].     Resources For Cancer Patients and Caregivers:    Oncolink.org:  A wonderful resource for patients and healthcare providers for information regarding your disease, ways to tract your treatment, what to expect, etc.      American Cancer Society:  800-227-2345  Can help patients locate various types of support and financial assistance   Cancer Care: 1-800-813-HOPE (4673) Provides financial assistance, online support groups, medication/co-pay assistance.     [place] DSS:  336-641-3447 Where to apply  for food stamps, Medicaid, and utility assistance   Medicare Rights Center: 800-333-4114 Helps people with Medicare understand their rights and benefits, navigate the Medicare system, and secure the quality healthcare they deserve   SCAT: 336-333-6589 [place] [employer]'s shared-ride transportation service for eligible riders who have a disability that prevents them from riding the fixed route bus.     For additional information on assistance programs please contact our social worker:   Abigail Elmore:  [phone]  

## 2018-02-20 NOTE — Telephone Encounter (Signed)
Gave pt avs and calendar  °

## 2018-03-26 ENCOUNTER — Encounter (INDEPENDENT_AMBULATORY_CARE_PROVIDER_SITE_OTHER): Payer: Medicare Other | Admitting: Ophthalmology

## 2018-03-26 DIAGNOSIS — H2513 Age-related nuclear cataract, bilateral: Secondary | ICD-10-CM | POA: Diagnosis not present

## 2018-03-26 DIAGNOSIS — B399 Histoplasmosis, unspecified: Secondary | ICD-10-CM

## 2018-03-26 DIAGNOSIS — H43813 Vitreous degeneration, bilateral: Secondary | ICD-10-CM | POA: Diagnosis not present

## 2018-03-26 DIAGNOSIS — H318 Other specified disorders of choroid: Secondary | ICD-10-CM | POA: Diagnosis not present

## 2018-05-07 ENCOUNTER — Encounter (INDEPENDENT_AMBULATORY_CARE_PROVIDER_SITE_OTHER): Payer: Medicare Other | Admitting: Ophthalmology

## 2018-05-09 ENCOUNTER — Encounter (INDEPENDENT_AMBULATORY_CARE_PROVIDER_SITE_OTHER): Payer: Medicare Other | Admitting: Ophthalmology

## 2018-05-09 DIAGNOSIS — H318 Other specified disorders of choroid: Secondary | ICD-10-CM | POA: Diagnosis not present

## 2018-05-09 DIAGNOSIS — H43813 Vitreous degeneration, bilateral: Secondary | ICD-10-CM

## 2018-05-09 DIAGNOSIS — H32 Chorioretinal disorders in diseases classified elsewhere: Secondary | ICD-10-CM

## 2018-05-09 DIAGNOSIS — B399 Histoplasmosis, unspecified: Secondary | ICD-10-CM | POA: Diagnosis not present

## 2018-05-09 DIAGNOSIS — H3570 Unspecified separation of retinal layers: Secondary | ICD-10-CM | POA: Diagnosis not present

## 2018-05-09 DIAGNOSIS — H2513 Age-related nuclear cataract, bilateral: Secondary | ICD-10-CM | POA: Diagnosis not present

## 2018-06-20 ENCOUNTER — Other Ambulatory Visit: Payer: Self-pay

## 2018-06-20 ENCOUNTER — Encounter (INDEPENDENT_AMBULATORY_CARE_PROVIDER_SITE_OTHER): Payer: Medicare Other | Admitting: Ophthalmology

## 2018-06-20 DIAGNOSIS — H318 Other specified disorders of choroid: Secondary | ICD-10-CM | POA: Diagnosis not present

## 2018-06-20 DIAGNOSIS — H2513 Age-related nuclear cataract, bilateral: Secondary | ICD-10-CM | POA: Diagnosis not present

## 2018-06-20 DIAGNOSIS — H43813 Vitreous degeneration, bilateral: Secondary | ICD-10-CM

## 2018-06-20 DIAGNOSIS — B399 Histoplasmosis, unspecified: Secondary | ICD-10-CM | POA: Diagnosis not present

## 2018-07-04 DIAGNOSIS — D649 Anemia, unspecified: Secondary | ICD-10-CM

## 2018-07-04 HISTORY — DX: Anemia, unspecified: D64.9

## 2018-07-16 DIAGNOSIS — I824Z9 Acute embolism and thrombosis of unspecified deep veins of unspecified distal lower extremity: Secondary | ICD-10-CM | POA: Diagnosis not present

## 2018-07-16 DIAGNOSIS — Z720 Tobacco use: Secondary | ICD-10-CM | POA: Diagnosis not present

## 2018-07-16 DIAGNOSIS — R0600 Dyspnea, unspecified: Secondary | ICD-10-CM | POA: Diagnosis not present

## 2018-07-16 DIAGNOSIS — D510 Vitamin B12 deficiency anemia due to intrinsic factor deficiency: Secondary | ICD-10-CM | POA: Diagnosis not present

## 2018-07-17 ENCOUNTER — Other Ambulatory Visit: Payer: Self-pay

## 2018-07-17 ENCOUNTER — Encounter (HOSPITAL_COMMUNITY): Payer: Self-pay | Admitting: *Deleted

## 2018-07-17 ENCOUNTER — Inpatient Hospital Stay (HOSPITAL_COMMUNITY)
Admission: EM | Admit: 2018-07-17 | Discharge: 2018-07-19 | DRG: 812 | Disposition: A | Discharge status: Home / Self Care | Payer: Medicare Other | Attending: Internal Medicine | Admitting: Internal Medicine

## 2018-07-17 DIAGNOSIS — R0602 Shortness of breath: Secondary | ICD-10-CM | POA: Diagnosis not present

## 2018-07-17 DIAGNOSIS — Z8711 Personal history of peptic ulcer disease: Secondary | ICD-10-CM | POA: Diagnosis not present

## 2018-07-17 DIAGNOSIS — D500 Iron deficiency anemia secondary to blood loss (chronic): Secondary | ICD-10-CM | POA: Diagnosis not present

## 2018-07-17 DIAGNOSIS — I82501 Chronic embolism and thrombosis of unspecified deep veins of right lower extremity: Secondary | ICD-10-CM | POA: Diagnosis present

## 2018-07-17 DIAGNOSIS — D62 Acute posthemorrhagic anemia: Principal | ICD-10-CM | POA: Diagnosis present

## 2018-07-17 DIAGNOSIS — E538 Deficiency of other specified B group vitamins: Secondary | ICD-10-CM | POA: Diagnosis present

## 2018-07-17 DIAGNOSIS — F17200 Nicotine dependence, unspecified, uncomplicated: Secondary | ICD-10-CM

## 2018-07-17 DIAGNOSIS — D510 Vitamin B12 deficiency anemia due to intrinsic factor deficiency: Secondary | ICD-10-CM | POA: Diagnosis not present

## 2018-07-17 DIAGNOSIS — Z91048 Other nonmedicinal substance allergy status: Secondary | ICD-10-CM | POA: Diagnosis not present

## 2018-07-17 DIAGNOSIS — Z9102 Food additives allergy status: Secondary | ICD-10-CM | POA: Diagnosis not present

## 2018-07-17 DIAGNOSIS — F17210 Nicotine dependence, cigarettes, uncomplicated: Secondary | ICD-10-CM | POA: Diagnosis present

## 2018-07-17 DIAGNOSIS — I2510 Atherosclerotic heart disease of native coronary artery without angina pectoris: Secondary | ICD-10-CM | POA: Diagnosis present

## 2018-07-17 DIAGNOSIS — R195 Other fecal abnormalities: Secondary | ICD-10-CM | POA: Diagnosis present

## 2018-07-17 DIAGNOSIS — Z79899 Other long term (current) drug therapy: Secondary | ICD-10-CM | POA: Diagnosis not present

## 2018-07-17 DIAGNOSIS — I82409 Acute embolism and thrombosis of unspecified deep veins of unspecified lower extremity: Secondary | ICD-10-CM | POA: Diagnosis present

## 2018-07-17 DIAGNOSIS — R0600 Dyspnea, unspecified: Secondary | ICD-10-CM | POA: Diagnosis not present

## 2018-07-17 DIAGNOSIS — K922 Gastrointestinal hemorrhage, unspecified: Secondary | ICD-10-CM | POA: Diagnosis not present

## 2018-07-17 DIAGNOSIS — D649 Anemia, unspecified: Secondary | ICD-10-CM | POA: Diagnosis present

## 2018-07-17 DIAGNOSIS — Z888 Allergy status to other drugs, medicaments and biological substances status: Secondary | ICD-10-CM | POA: Diagnosis not present

## 2018-07-17 DIAGNOSIS — Z7901 Long term (current) use of anticoagulants: Secondary | ICD-10-CM

## 2018-07-17 DIAGNOSIS — I824Z9 Acute embolism and thrombosis of unspecified deep veins of unspecified distal lower extremity: Secondary | ICD-10-CM | POA: Diagnosis not present

## 2018-07-17 DIAGNOSIS — Z720 Tobacco use: Secondary | ICD-10-CM | POA: Diagnosis not present

## 2018-07-17 DIAGNOSIS — I82541 Chronic embolism and thrombosis of right tibial vein: Secondary | ICD-10-CM | POA: Diagnosis not present

## 2018-07-17 HISTORY — DX: Anemia, unspecified: D64.9

## 2018-07-17 LAB — CBC WITH DIFFERENTIAL/PLATELET
Abs Immature Granulocytes: 0.01 10*3/uL (ref 0.00–0.07)
Basophils Absolute: 0 10*3/uL (ref 0.0–0.1)
Basophils Relative: 0 %
Eosinophils Absolute: 0.1 10*3/uL (ref 0.0–0.5)
Eosinophils Relative: 3 %
HCT: 18.4 % — ABNORMAL LOW (ref 36.0–46.0)
Hemoglobin: 4.8 g/dL — CL (ref 12.0–15.0)
Immature Granulocytes: 0 %
Lymphocytes Relative: 35 %
Lymphs Abs: 1.4 10*3/uL (ref 0.7–4.0)
MCH: 19.2 pg — ABNORMAL LOW (ref 26.0–34.0)
MCHC: 26.1 g/dL — ABNORMAL LOW (ref 30.0–36.0)
MCV: 73.6 fL — ABNORMAL LOW (ref 80.0–100.0)
Monocytes Absolute: 0.5 10*3/uL (ref 0.1–1.0)
Monocytes Relative: 14 %
Neutro Abs: 1.9 10*3/uL (ref 1.7–7.7)
Neutrophils Relative %: 48 %
Platelets: 188 10*3/uL (ref 150–400)
RBC: 2.5 MIL/uL — ABNORMAL LOW (ref 3.87–5.11)
RDW: 17.7 % — ABNORMAL HIGH (ref 11.5–15.5)
WBC: 3.9 10*3/uL — ABNORMAL LOW (ref 4.0–10.5)
nRBC: 0 % (ref 0.0–0.2)

## 2018-07-17 LAB — COMPREHENSIVE METABOLIC PANEL
ALT: 10 U/L (ref 0–44)
AST: 17 U/L (ref 15–41)
Albumin: 3.2 g/dL — ABNORMAL LOW (ref 3.5–5.0)
Alkaline Phosphatase: 61 U/L (ref 38–126)
Anion gap: 7 (ref 5–15)
BUN: 18 mg/dL (ref 8–23)
CO2: 26 mmol/L (ref 22–32)
Calcium: 9.3 mg/dL (ref 8.9–10.3)
Chloride: 106 mmol/L (ref 98–111)
Creatinine, Ser: 0.89 mg/dL (ref 0.44–1.00)
GFR calc Af Amer: >60 mL/min (ref >60)
GFR calc non Af Amer: >60 mL/min (ref >60)
Glucose, Bld: 127 mg/dL — ABNORMAL HIGH (ref 70–99)
Potassium: 3.5 mmol/L (ref 3.5–5.1)
Sodium: 139 mmol/L (ref 135–145)
Total Bilirubin: 0.3 mg/dL (ref 0.3–1.2)
Total Protein: 6.3 g/dL — ABNORMAL LOW (ref 6.5–8.1)

## 2018-07-17 LAB — BASIC METABOLIC PANEL
BUN: 22 — AB (ref 4–21)
Creatinine: 0.8 (ref 0.5–1.1)
Glucose: 115
Potassium: 4.1 (ref 3.4–5.3)
Sodium: 139 (ref 137–147)

## 2018-07-17 LAB — VITAMIN B12
Vitamin B-12: 307
Vitamin B-12: 318 pg/mL (ref 180–914)

## 2018-07-17 LAB — RETICULOCYTES
Immature Retic Fract: 22.5 % — ABNORMAL HIGH (ref 2.3–15.9)
RBC.: 2.48 MIL/uL — ABNORMAL LOW (ref 3.87–5.11)
Retic Count, Absolute: 72.7 10*3/uL (ref 19.0–186.0)
Retic Ct Pct: 2.9 % (ref 0.4–3.1)

## 2018-07-17 LAB — IRON AND TIBC
Iron: 12 ug/dL — ABNORMAL LOW (ref 28–170)
Saturation Ratios: 2 % — ABNORMAL LOW (ref 10.4–31.8)
TIBC: 510 ug/dL — ABNORMAL HIGH (ref 250–450)
UIBC: 498 ug/dL

## 2018-07-17 LAB — PROTIME-INR
INR: 1 (ref 0.8–1.2)
Prothrombin Time: 13.2 seconds (ref 11.4–15.2)

## 2018-07-17 LAB — HEPATIC FUNCTION PANEL
ALT: 7 (ref 7–35)
AST: 10 — AB (ref 13–35)
Alkaline Phosphatase: 54 (ref 25–125)
Bilirubin, Total: 0.3

## 2018-07-17 LAB — CBC AND DIFFERENTIAL
HCT: 17 — AB (ref 36–46)
Hemoglobin: 5.1 — AB (ref 12.0–16.0)
Platelets: 178 (ref 150–399)
WBC: 4.3

## 2018-07-17 LAB — TSH: TSH: 1.72 (ref 0.41–5.90)

## 2018-07-17 LAB — FOLATE: Folate: 10.7 ng/mL (ref >5.9)

## 2018-07-17 LAB — FERRITIN: Ferritin: 2 ng/mL — ABNORMAL LOW (ref 11–307)

## 2018-07-17 LAB — POC OCCULT BLOOD, ED: Fecal Occult Bld: POSITIVE — AB

## 2018-07-17 LAB — PREPARE RBC (CROSSMATCH)

## 2018-07-17 MED ORDER — SODIUM CHLORIDE 0.9 % IV SOLN
8.0000 mg/h | INTRAVENOUS | Status: DC
  Administered 2018-07-17: 8 mg/h via INTRAVENOUS
  Filled 2018-07-17 (×3): qty 80

## 2018-07-17 MED ORDER — ACETAMINOPHEN 325 MG PO TABS
650.0000 mg | ORAL_TABLET | Freq: Four times a day (QID) | ORAL | Status: DC | PRN
  Administered 2018-07-18 (×2): 650 mg via ORAL
  Filled 2018-07-17 (×3): qty 2

## 2018-07-17 MED ORDER — SODIUM CHLORIDE 0.9 % IV SOLN
510.0000 mg | Freq: Once | INTRAVENOUS | Status: AC
  Administered 2018-07-17: 510 mg via INTRAVENOUS
  Filled 2018-07-17: qty 17

## 2018-07-17 MED ORDER — SODIUM CHLORIDE 0.9 % IV SOLN: 10.0000 mL/h | Freq: Once | INTRAVENOUS | Status: DC

## 2018-07-17 MED ORDER — NICOTINE 21 MG/24HR TD PT24
21.0000 mg | MEDICATED_PATCH | Freq: Once | TRANSDERMAL | Status: AC
  Administered 2018-07-17: 21 mg via TRANSDERMAL
  Filled 2018-07-17: qty 1

## 2018-07-17 MED ORDER — SODIUM CHLORIDE 0.9 % IV SOLN
80.0000 mg | Freq: Once | INTRAVENOUS | Status: AC
  Administered 2018-07-17: 80 mg via INTRAVENOUS
  Filled 2018-07-17: qty 80

## 2018-07-17 NOTE — ED Notes (Addendum)
Patient transferred to hospital bed and given cheese and crackers.

## 2018-07-17 NOTE — ED Notes (Signed)
H&H needs to be retimed.  Pt has not received blood yet.

## 2018-07-17 NOTE — ED Notes (Signed)
Called blood bank, still waiting on antibody testing. Will call when ready.

## 2018-07-17 NOTE — ED Notes (Signed)
MD Ogbata aware of blood bank issue. Will continue to monitor at this time.

## 2018-07-17 NOTE — ED Notes (Addendum)
Blood bank called- patient has 3 different blood antibodies. We do not currently have any negative antibody blood in house. It could be tomorrow before any is sent from supplier.   Admitting paged.

## 2018-07-17 NOTE — ED Triage Notes (Signed)
Pt in sent for eval for hgb 5 after seeing PCP for SOB, with recent requirement for eval for SOB, pt takes Eliquis, denies pain, denies obvious bleeding, pt A&O x4

## 2018-07-17 NOTE — H&P (Signed)
History and Physical  Jodi Myers URK:[id number] DOB: [birth year] DOA: 07/17/2018  Referring physician: ER provider PCP: Kathyrn Lass, MD  Outpatient Specialists:    Patient coming from: Home  Chief Complaint: Shortness of breath and dyspnea on exertion  HPI: Patient is a 68 year old Caucasian female with past medical history significant for pernicious anemia, right lower extremity DVT on Xarelto, tobacco abuse (1/2 packs daily) and remote history of possible stomach ulcer secondary to steroids about 40 years ago.  Patient may have previously undiagnosed iron deficiency.  Patient takes Excedrin intermittently.  Patient reports progressive shortness of breath over the last couple of months.  Shortness of breath became worse about 3 to 4 days prior to presentation, with associated dyspnea on exertion.  No orthopnea.  Patient was seen and the primary care provider's office due to shortness of breath and work-up done (CBC) revealed hemoglobin of 5 g/dL.  Patient was advised to come to the hospital for further assessment and management.  On presentation to the hospital, CBC revealed a hemoglobin of 4.8 g/dL.  No headache, no neck pain, no fever or chills, no URI symptoms, no chest pain, no GI symptoms and no urinary symptoms.  Patient denies hematochezia.  Patient was admitted for further assessment and management.   ED Course: Patient was transfused with 2 units of packed red blood cells by the ER team.  ER team was also consulted the GI team.  Hospitalist team has been called to admit patient.  Pertinent labs: Chemistry reveals sodium of 139, potassium of 3.5, chloride 106, CO2 26, BUN of 18 with creatinine of 0.89 with blood sugar of 127.  Total protein is 6.3 with albumin of 3.2.  CBC reveals WBC of 3.9, hemoglobin of 4.8, hematocrit of 18.4, MCV of 73.6 with platelet count of 188 (hemoglobin was 11.7 g/dL on 02/20/2018).  Fecal occult blood came back positive.  EKG: Independently reviewed.    Review of Systems:  Negative for fever, visual changes, sore throat, rash, new muscle aches, chest pain, dysuria, n/v/abdominal pain.  Past Medical History:  Diagnosis Date  . Coronary artery disease     Past Surgical History:  Procedure Laterality Date  . TONSILLECTOMY       reports that she has been smoking cigarettes. She has a 73.50 pack-year smoking history. She has never used smokeless tobacco. She reports current alcohol use of about 7.0 standard drinks of alcohol per week. She reports that she does not use drugs.  Allergies  Allergen Reactions  . Mango Flavor     Blisters  . Poison Ivy Extract     Blisters  . Sporanox [Itraconazole]     Blisters    Family History  Problem Relation Age of Onset  . Diabetes Mellitus II Mother   . Diabetes Mellitus I Sister   . Colon cancer Neg Hx      Prior to Admission medications   Medication Sig Start Date End Date Taking? Authorizing Provider  acetaminophen (TYLENOL) 325 MG tablet Take 2 tablets (650 mg total) by mouth every 6 (six) hours as needed for mild pain (or Fever >/= 101). 01/24/18   Arrien, Jimmy Picket, MD  cyanocobalamin (,VITAMIN B-12,) 1000 MCG/ML injection Daily for 7 days, then weekly for 8 weeks. 01/24/18   Arrien, Jimmy Picket, MD  Rivaroxaban 15 & 20 MG TBPK Take as directed on package: Start with one 15mg  tablet by mouth twice a day with food. On Day 22, switch to one 20mg  tablet once a day with  food. 01/24/18   Arrien, Jimmy Picket, MD    Physical Exam: Vitals:   07/17/18 1643  BP: (!) 134/52  Pulse: (!) 110  Resp: 18  Temp: 98.5 F (36.9 C)  TempSrc: Oral  SpO2: 99%    Constitutional:  . Appears calm and comfortable Eyes:  . Pallor. No jaundice.  ENMT:  . external ears, nose appear normal Neck:  . Neck is supple. No JVD Respiratory:  . Decreased air entry globally.   Cardiovascular:  . S1S2 . Mild LE extremity edema   Abdomen:  . Abdomen is soft and non tender. Organs are  difficult to assess. Neurologic:  . Awake and alert. . Moves all limbs.  Wt Readings from Last 3 Encounters:  02/20/18 67.9 kg  01/24/18 72.8 kg    I have personally reviewed following labs and imaging studies  Labs on Admission:  CBC: Recent Labs  Lab 07/17/18 1656  WBC 3.9*  NEUTROABS 1.9  HGB 4.8*  HCT 18.4*  MCV 73.6*  PLT 628   Basic Metabolic Panel: Recent Labs  Lab 07/17/18 1656  NA 139  K 3.5  CL 106  CO2 26  GLUCOSE 127*  BUN 18  CREATININE 0.89  CALCIUM 9.3   Liver Function Tests: Recent Labs  Lab 07/17/18 1656  AST 17  ALT 10  ALKPHOS 61  BILITOT 0.3  PROT 6.3*  ALBUMIN 3.2*   No results for input(s): LIPASE, AMYLASE in the last 168 hours. No results for input(s): AMMONIA in the last 168 hours. Coagulation Profile: No results for input(s): INR, PROTIME in the last 168 hours. Cardiac Enzymes: No results for input(s): CKTOTAL, CKMB, CKMBINDEX, TROPONINI in the last 168 hours. BNP (last 3 results) No results for input(s): PROBNP in the last 8760 hours. HbA1C: No results for input(s): HGBA1C in the last 72 hours. CBG: No results for input(s): GLUCAP in the last 168 hours. Lipid Profile: No results for input(s): CHOL, HDL, LDLCALC, TRIG, CHOLHDL, LDLDIRECT in the last 72 hours. Thyroid Function Tests: No results for input(s): TSH, T4TOTAL, FREET4, T3FREE, THYROIDAB in the last 72 hours. Anemia Panel: No results for input(s): VITAMINB12, FOLATE, FERRITIN, TIBC, IRON, RETICCTPCT in the last 72 hours. Urine analysis: No results found for: COLORURINE, APPEARANCEUR, LABSPEC, PHURINE, GLUCOSEU, HGBUR, BILIRUBINUR, KETONESUR, PROTEINUR, UROBILINOGEN, NITRITE, LEUKOCYTESUR Sepsis Labs: @LABRCNTIP (procalcitonin:4,lacticidven:4) )No results found for this or any previous visit (from the past 240 hour(s)).    Radiological Exams on Admission: No results found.  EKG: Independently reviewed.   Active Problems:   Symptomatic anemia    Assessment/Plan Symptomatic anemia, severe: Hemoglobin on presentation was 4.8 g/dL (drop from 11.7 g/dL in November 2019). Fecal occult blood is positive Patient was on Xarelto prior to admission for right lower extremity DVT Patient was also using Excedrin as needed.  Patient has remote history of gastric ulcer. Iron studies done in November was suggestive of iron deficiency anemia as well. Patient was not on iron prior to presentation. Patient has been compliant with B12 supplementation (patient has pernicious anemia). ER team is planning to transfuse patient with 2 units of packed red blood cells. Will give patient 1 dose of IV iron. ER team has consulted GI team. Will monitor H&H every 6 hours. Will check orthostasis. Further management will depend on hospital course.  Acute blood loss anemia: Likely secondary to above. See above documentation.  Pernicious anemia: Continue B12 injections.  Likely previously undiagnosed iron deficiency anemia: Fecal occult blood is positive Repeat iron studies are pending IV  iron ER is transfusing patient with 2 units of packed red blood cells Further management depend on hospital course  Tobacco use disorder: Counseled to quit cigarettes.  Recent right lower extremity DVT on Xarelto:  Xarelto will be on hold. Further management will depend on hospital course.  DVT prophylaxis: SCD to left lower extremity Code Status: Full code Family Communication:  Disposition Plan: Home eventually Consults called: ER has consulted GI team Admission status: Inpatient  Time spent: 65 minutes  Dana Allan, MD  [facility] Pager #: [phone] 7PM-7AM contact night coverage as above  07/17/2018, 7:32 PM

## 2018-07-17 NOTE — ED Provider Notes (Signed)
[facility] EMERGENCY DEPARTMENT Provider Note   CSN: [id number] Arrival date & time: 07/17/18  1637  History   Chief Complaint Chief Complaint  Patient presents with  . Abnormal Lab    HPI Jodi Myers is a 68 y.o. female with a hx of tobacco abuse, CAD, pancytopenia, pernicious anemia, and prior DVT on xarelto who presents to the ED for progressively worsening dyspnea & fatigue for the past 1 month. Patient states she has a hx of pernicious anemia, after discharge from the hospital 01/2018 was on daily, then weekly, then monthly B12 injections. She thinks her sxs seem to minimally start after she switched to monthly injections but really noticed the dyspnea/fatigue over past 1 month. Worse with activity, improved with rest. No other alleviating/aggravating factors. Denies dizziness, lightheadedness, syncope, chest pain, nausea, vomiting, hematemesis, abdominal pain, melena, or hematochezia.   She followed up with hematology after last hospitalization and was then instructed to follow up with PCP. Was planning to follow up with GI but never heard back- at that time plan was for upper/lower endoscopy, has not had these in the past. She is on Xarelto. No increase in NSAIDs or EtOH.    HPI  Past Medical History:  Diagnosis Date  . Coronary artery disease     Patient Active Problem List   Diagnosis Date Noted  . Pernicious anemia   . Pancytopenia ([place]) 01/23/2018  . Symptomatic anemia 01/23/2018  . DVT, lower extremity ([place]) 01/23/2018  . B12 deficiency 01/23/2018    Past Surgical History:  Procedure Laterality Date  . TONSILLECTOMY       OB History   No obstetric history on file.      Home Medications    Prior to Admission medications   Medication Sig Start Date End Date Taking? Authorizing Provider  acetaminophen (TYLENOL) 325 MG tablet Take 2 tablets (650 mg total) by mouth every 6 (six) hours as needed for mild pain (or Fever >/= 101). 01/24/18    Arrien, Jimmy Picket, MD  cyanocobalamin (,VITAMIN B-12,) 1000 MCG/ML injection Daily for 7 days, then weekly for 8 weeks. 01/24/18   Arrien, Jimmy Picket, MD  Rivaroxaban 15 & 20 MG TBPK Take as directed on package: Start with one 15mg  tablet by mouth twice a day with food. On Day 22, switch to one 20mg  tablet once a day with food. 01/24/18   Arrien, Jimmy Picket, MD    Family History Family History  Problem Relation Age of Onset  . Diabetes Mellitus II Mother   . Diabetes Mellitus I Sister   . Colon cancer Neg Hx     Social History Social History   Tobacco Use  . Smoking status: Current Every Day Smoker    Packs/day: 1.50    Years: 49.00    Pack years: 73.50    Types: Cigarettes  . Smokeless tobacco: Never Used  Substance Use Topics  . Alcohol use: Yes    Alcohol/week: 7.0 standard drinks    Types: 7 Cans of beer per week    Comment: a beer a day  . Drug use: Never     Allergies   Mango flavor; Poison ivy extract; and Sporanox [itraconazole]   Review of Systems Review of Systems  Constitutional: Positive for fatigue. Negative for chills and fever.  Respiratory: Positive for shortness of breath.   Cardiovascular: Negative for chest pain and palpitations.  Gastrointestinal: Negative for abdominal pain, anal bleeding, blood in stool, constipation, diarrhea, nausea and vomiting.  Neurological:  Negative for dizziness, syncope and light-headedness.  All other systems reviewed and are negative.    Physical Exam Updated Vital Signs BP (!) 134/52 (BP Location: Right Arm)   Pulse (!) 110   Temp 98.5 F (36.9 C) (Oral)   Resp 18   SpO2 99%   Physical Exam Vitals signs and nursing note reviewed. Exam conducted with a chaperone present.  Constitutional:      General: She is not in acute distress.    Appearance: She is well-developed. She is not toxic-appearing.  HENT:     Head: Normocephalic and atraumatic.  Eyes:     General:        Right eye: No  discharge.        Left eye: No discharge.     Conjunctiva/sclera: Conjunctivae normal.  Neck:     Musculoskeletal: Neck supple.  Cardiovascular:     Rate and Rhythm: Normal rate and regular rhythm.  Pulmonary:     Effort: Pulmonary effort is normal. No respiratory distress.     Breath sounds: Normal breath sounds. No wheezing, rhonchi or rales.  Abdominal:     General: There is no distension.     Palpations: Abdomen is soft.     Tenderness: There is no abdominal tenderness. There is no guarding or rebound.  Genitourinary:    Comments: EDT Holly present as chaperone.  Dark stool noted on DRE. No bright red blood.  Skin:    General: Skin is warm and dry.     Findings: No rash.  Neurological:     Mental Status: She is alert.     Comments: Clear speech.   Psychiatric:        Behavior: Behavior normal.    ED Treatments / Results  Labs (all labs ordered are listed, but only abnormal results are displayed) Labs Reviewed  CBC WITH DIFFERENTIAL/PLATELET - Abnormal; Notable for the following components:      Result Value   WBC 3.9 (*)    RBC 2.50 (*)    Hemoglobin 4.8 (*)    HCT 18.4 (*)    MCV 73.6 (*)    MCH 19.2 (*)    MCHC 26.1 (*)    RDW 17.7 (*)    All other components within normal limits  COMPREHENSIVE METABOLIC PANEL  TYPE AND SCREEN    EKG None  Radiology No results found.  Procedures Procedures (including critical care time)  CRITICAL CARE Performed by: Kennith Maes   Total critical care time: 30 minutes  Critical care time was exclusive of separately billable procedures and treating other patients.  Critical care was necessary to treat or prevent imminent or life-threatening deterioration.  Critical care was time spent personally by me on the following activities: development of treatment plan with patient and/or surrogate as well as nursing, discussions with consultants, evaluation of patient's response to treatment, examination of patient,  obtaining history from patient or surrogate, ordering and performing treatments and interventions, ordering and review of laboratory studies, ordering and review of radiographic studies, pulse oximetry and re-evaluation of patient's condition.  Medications Ordered in ED Medications  pantoprazole (PROTONIX) 80 mg in sodium chloride 0.9 % 100 mL IVPB (has no administration in time range)  pantoprazole (PROTONIX) 80 mg in sodium chloride 0.9 % 250 mL (0.32 mg/mL) infusion (has no administration in time range)  0.9 %  sodium chloride infusion (has no administration in time range)  nicotine (NICODERM CQ - dosed in mg/24 hours) patch 21 mg (21 mg Transdermal  Patch Applied 07/17/18 1857)     Initial Impression / Assessment and Plan / ED Course  I have reviewed the triage vital signs and the nursing notes.  Pertinent labs & imaging results that were available during my care of the patient were reviewed by me and considered in my medical decision making (see chart for details).   Patient presents to the ED with dyspnea/fatigue, hx of similar related to pernicious anemia. Nontoxic appearing, tachycardia resolved on my exam. Heart RRR. Lungs CTA. Abdomen nontender. DRE w/ dark stool- fecal occult positive. Hgb/hct low at 4.8/18.4. Suspect multifactorial w/ hx of pernicious anemia & fecal occult positive stool, BUN is not significant elevated. Labs are otherwise fairly unremarkable. Plan for transfusion, protonix, discuss w/ GI, and admission. Defer anticoagulation reversal to admitting team. Findings and plan of care discussed with supervising physician Dr. Francia Greaves who is in agreement. Patient initially hesitant regarding admission but ultimately agreeable. Requesting nicotine patch which was ordered.   19:17: CONSULT: Discussed with hospitalist  Dr. Marthenia Rolling- accepts admission  Dr. Francia Greaves spoke with [facility]- recommends discussion with [facility] given patient's PCP, Dr. Francia Greaves subsequently spoke with Dr.  Michail Sermon with [facility]- agrees w/ protonix, available for consultation.   Final Clinical Impressions(s) / ED Diagnoses   Final diagnoses:  Symptomatic anemia    ED Discharge Orders    None       Leafy Kindle 07/17/18 1959    Valarie Merino, MD 07/17/18 2333

## 2018-07-18 ENCOUNTER — Encounter (HOSPITAL_COMMUNITY): Payer: Self-pay | Admitting: General Practice

## 2018-07-18 ENCOUNTER — Telehealth: Payer: Self-pay | Admitting: Hematology

## 2018-07-18 DIAGNOSIS — I82541 Chronic embolism and thrombosis of right tibial vein: Secondary | ICD-10-CM

## 2018-07-18 DIAGNOSIS — K922 Gastrointestinal hemorrhage, unspecified: Secondary | ICD-10-CM

## 2018-07-18 DIAGNOSIS — E538 Deficiency of other specified B group vitamins: Secondary | ICD-10-CM

## 2018-07-18 LAB — BASIC METABOLIC PANEL
Anion gap: 9 (ref 5–15)
BUN: 15 mg/dL (ref 8–23)
CO2: 22 mmol/L (ref 22–32)
Calcium: 9 mg/dL (ref 8.9–10.3)
Chloride: 108 mmol/L (ref 98–111)
Creatinine, Ser: 0.78 mg/dL (ref 0.44–1.00)
GFR calc Af Amer: >60 mL/min (ref >60)
GFR calc non Af Amer: >60 mL/min (ref >60)
Glucose, Bld: 103 mg/dL — ABNORMAL HIGH (ref 70–99)
Potassium: 3.4 mmol/L — ABNORMAL LOW (ref 3.5–5.1)
Sodium: 139 mmol/L (ref 135–145)

## 2018-07-18 LAB — CBC
HCT: 16.6 % — ABNORMAL LOW (ref 36.0–46.0)
Hemoglobin: 4.4 g/dL — CL (ref 12.0–15.0)
MCH: 19.4 pg — ABNORMAL LOW (ref 26.0–34.0)
MCHC: 26.5 g/dL — ABNORMAL LOW (ref 30.0–36.0)
MCV: 73.1 fL — ABNORMAL LOW (ref 80.0–100.0)
Platelets: 176 10*3/uL (ref 150–400)
RBC: 2.27 MIL/uL — ABNORMAL LOW (ref 3.87–5.11)
RDW: 17.7 % — ABNORMAL HIGH (ref 11.5–15.5)
WBC: 4.3 10*3/uL (ref 4.0–10.5)
nRBC: 0 % (ref 0.0–0.2)

## 2018-07-18 LAB — HEMOGLOBIN AND HEMATOCRIT, BLOOD
HCT: 16.5 % — ABNORMAL LOW (ref 36.0–46.0)
HCT: 23.3 % — ABNORMAL LOW (ref 36.0–46.0)
Hemoglobin: 4.4 g/dL — CL (ref 12.0–15.0)
Hemoglobin: 6.9 g/dL — CL (ref 12.0–15.0)

## 2018-07-18 LAB — PREPARE RBC (CROSSMATCH)

## 2018-07-18 MED ORDER — PANTOPRAZOLE SODIUM 40 MG PO TBEC: 40.0000 mg | DELAYED_RELEASE_TABLET | Freq: Every day | ORAL | 1 refills | Status: DC

## 2018-07-18 MED ORDER — FERROUS GLUCONATE 324 (38 FE) MG PO TABS: 324.0000 mg | ORAL_TABLET | Freq: Every day | ORAL | 3 refills | Status: DC

## 2018-07-18 MED ORDER — POTASSIUM CHLORIDE CRYS ER 20 MEQ PO TBCR
40.0000 meq | EXTENDED_RELEASE_TABLET | Freq: Once | ORAL | Status: AC
  Administered 2018-07-18: 40 meq via ORAL
  Filled 2018-07-18: qty 2

## 2018-07-18 MED ORDER — SODIUM CHLORIDE 0.9% IV SOLUTION: Freq: Once | INTRAVENOUS | Status: DC

## 2018-07-18 MED ORDER — ASPIRIN EC 81 MG PO TBEC: 81.0000 mg | DELAYED_RELEASE_TABLET | Freq: Every day | ORAL | 2 refills | Status: DC

## 2018-07-18 MED ORDER — NICOTINE 21 MG/24HR TD PT24: 21.0000 mg | MEDICATED_PATCH | Freq: Once | TRANSDERMAL | 0 refills | Status: AC

## 2018-07-18 MED ORDER — NICOTINE 21 MG/24HR TD PT24
21.0000 mg | MEDICATED_PATCH | Freq: Once | TRANSDERMAL | Status: DC
  Administered 2018-07-18: 21 mg via TRANSDERMAL
  Filled 2018-07-18: qty 1

## 2018-07-18 NOTE — Telephone Encounter (Signed)
Moved 5/12 appt to 4/28 per sch msg. Called and spoke with patient. Confirmed date and time

## 2018-07-18 NOTE — Consult Note (Signed)
Subjective:   HPI  The patient is a 68 year old female who was admitted to the hospital with shortness of breath and dyspnea on exertion. She was found to be severely anemic. She was found to have heme-positive stool. She gives a remote history of peptic ulcer disease secondary to steroid use 40 years ago. She has not had any active bleeding to her knowledge.she was seen by her primary care physician who found severe anemia and she was subsequently sent to the hospital and admitted. Patient denies hematemesis or melena. Patient has been on Xeralto.  Patient states that she is waiting on getting a blood transfusion. Feels fine. Wants to eat. Specifically states she refuses an endoscopy or colonoscopy.  Review of Systems No chest pain. Dyspnea on exertion.  Past Medical History:  Diagnosis Date  . Coronary artery disease    Past Surgical History:  Procedure Laterality Date  . TONSILLECTOMY     Social History   Socioeconomic History  . Marital status: Married    Spouse name: Not on file  . Number of children: Not on file  . Years of education: Not on file  . Highest education level: Not on file  Occupational History  . Not on file  Social Needs  . Financial resource strain: Not on file  . Food insecurity:    Worry: Not on file    Inability: Not on file  . Transportation needs:    Medical: Not on file    Non-medical: Not on file  Tobacco Use  . Smoking status: Current Every Day Smoker    Packs/day: 1.50    Years: 49.00    Pack years: 73.50    Types: Cigarettes  . Smokeless tobacco: Never Used  Substance and Sexual Activity  . Alcohol use: Yes    Alcohol/week: 7.0 standard drinks    Types: 7 Cans of beer per week    Comment: a beer a day  . Drug use: Never  . Sexual activity: Not on file  Lifestyle  . Physical activity:    Days per week: Not on file    Minutes per session: Not on file  . Stress: Not on file  Relationships  . Social connections:    Talks on phone:  Not on file    Gets together: Not on file    Attends religious service: Not on file    Active member of club or organization: Not on file    Attends meetings of clubs or organizations: Not on file    Relationship status: Not on file  . Intimate partner violence:    Fear of current or ex partner: Not on file    Emotionally abused: Not on file    Physically abused: Not on file    Forced sexual activity: Not on file  Other Topics Concern  . Not on file  Social History Narrative  . Not on file   family history includes Diabetes Mellitus I in her sister; Diabetes Mellitus II in her mother.  Current Facility-Administered Medications:  .  0.9 %  sodium chloride infusion, 10 mL/hr, Intravenous, Once, Dana Allan I, MD .  acetaminophen (TYLENOL) tablet 650 mg, 650 mg, Oral, Q6H PRN, Dana Allan I, MD, 650 mg at 07/18/18 1030 .  nicotine (NICODERM CQ - dosed in mg/24 hours) patch 21 mg, 21 mg, Transdermal, Once, Petrucelli, Samantha R, PA-C, 21 mg at 07/17/18 1857 .  pantoprazole (PROTONIX) 80 mg in sodium chloride 0.9 % 250 mL (0.32 mg/mL) infusion, 8  mg/hr, Intravenous, Continuous, Dana Allan I, MD, Last Rate: 25 mL/hr at 07/17/18 1940, 8 mg/hr at 07/17/18 1940 Allergies  Allergen Reactions  . Mango Flavor Other (See Comments)    Blisters  . Poison Ivy Extract Other (See Comments)    Blisters  . Sporanox [Itraconazole] Other (See Comments)    Blisters     Objective:     BP (!) 106/50 (BP Location: Right Arm)   Pulse 79   Temp 98.4 F (36.9 C) (Oral)   Resp 14   Ht 5\' 6"  (1.676 m)   Wt 69.6 kg   SpO2 98%   BMI 24.77 kg/m   Alert and oriented  No acute distress  Heart regular rhythm no murmurs  Lungs clear.  Abdomen soft and nontender  Laboratory No components found for: D1    Assessment:     Severe symptomatic anemia with heme-positive stool.      Plan:     The patient is waiting on a blood transfusion. She wants to eat. She specifically  refuses endoscopy or colonoscopy. Recommend PPI therapy. We will give her a diet. Lab Results  Component Value Date   HGB 4.4 (LL) 07/18/2018   HGB 4.4 (LL) 07/18/2018   HGB 4.8 (LL) 07/17/2018   HGB 11.7 (L) 02/20/2018   HCT 16.6 (L) 07/18/2018   HCT 16.5 (L) 07/18/2018   HCT 18.4 (L) 07/17/2018   ALKPHOS 61 07/17/2018   ALKPHOS 95 02/20/2018   ALKPHOS 58 01/23/2018   AST 17 07/17/2018   AST 14 (L) 02/20/2018   AST 46 (H) 01/23/2018   ALT 10 07/17/2018   ALT 8 02/20/2018   ALT 12 01/23/2018

## 2018-07-18 NOTE — ED Notes (Signed)
ED TO INPATIENT HANDOFF REPORT  ED Nurse Name and Phone #:  Joellen Jersey [phone]  S Name/Age/Gender Jodi Myers 68 y.o. female Room/Bed: 030C/030C  Code Status   Code Status: Full Code  Home/SNF/Other Home Patient oriented to: self, place, time and situation Is this baseline? Yes   Triage Complete: Triage complete  Chief Complaint Low Hemoglobin   Triage Note Pt in sent for eval for hgb 5 after seeing PCP for SOB, with recent requirement for eval for SOB, pt takes Eliquis, denies pain, denies obvious bleeding, pt A&O x4   Allergies Allergies  Allergen Reactions  . Mango Flavor Other (See Comments)    Blisters  . Poison Ivy Extract Other (See Comments)    Blisters  . Sporanox [Itraconazole] Other (See Comments)    Blisters    Level of Care/Admitting Diagnosis ED Disposition    ED Disposition Condition Comment   Admit  Hospital Area: [facility] [100100]  Level of Care: Progressive [102]  Diagnosis: Symptomatic anemia [3818299]  Admitting Physician: Shirlean Mylar  Attending Physician: Dana Allan I [[id number]]  Estimated length of stay: past midnight tomorrow  Certification:: I certify this patient will need inpatient services for at least 2 midnights  Possible Covid Disease Patient Isolation: N/A  PT Class (Do Not Modify): Inpatient [101]  PT Acc Code (Do Not Modify): Private [1]       B Medical/Surgery History Past Medical History:  Diagnosis Date  . Coronary artery disease    Past Surgical History:  Procedure Laterality Date  . TONSILLECTOMY       A IV Location/Drains/Wounds Patient Lines/Drains/Airways Status   Active Line/Drains/Airways    Name:   Placement date:   Placement time:   Site:   Days:   Peripheral IV 07/17/18 Left Antecubital   07/17/18    1813    Antecubital   1   Peripheral IV 07/17/18 Right Antecubital   07/17/18    1848    Antecubital   1          Intake/Output Last 24 hours No intake or output  data in the 24 hours ending 07/18/18 0009  Labs/Imaging Results for orders placed or performed during the hospital encounter of 07/17/18 (from the past 48 hour(s))  Type and screen [place]     Status: None   Collection Time: 07/17/18  4:56 PM  Result Value Ref Range   ABO/RH(D) B POS    Antibody Screen POS    Sample Expiration 07/20/2018    PT AG Type      NEGATIVE FOR C ANTIGEN NEGATIVE FOR e ANTIGEN POSITIVE FOR DUFFY B ANTIGEN NEGATIVE FOR LEWIS A ANTIGEN POSITIVE FOR S ANTIGEN POSITIVE FOR M ANTIGEN NEGATIVE FOR KELL ANTIGEN   Antibody Identification      ANTI K ANTI e Performed at [facility], [place] [address], [place], [place] [address]   CBC with Differential     Status: Abnormal   Collection Time: 07/17/18  4:56 PM  Result Value Ref Range   WBC 3.9 (L) 4.0 - 10.5 K/uL   RBC 2.50 (L) 3.87 - 5.11 MIL/uL   Hemoglobin 4.8 (LL) 12.0 - 15.0 g/dL    Comment: REPEATED TO VERIFY Reticulocyte Hemoglobin testing may be clinically indicated, consider ordering this additional test [id number] THIS CRITICAL RESULT HAS VERIFIED AND BEEN CALLED TO H.JASPER RN BY Rocio Roam MCCORMICK ON 04 14 2020 AT 1751, AND HAS BEEN READ BACK.  HCT 18.4 (L) 36.0 - 46.0 %   MCV 73.6 (L) 80.0 - 100.0 fL   MCH 19.2 (L) 26.0 - 34.0 pg   MCHC 26.1 (L) 30.0 - 36.0 g/dL   RDW 17.7 (H) 11.5 - 15.5 %   Platelets 188 150 - 400 K/uL   nRBC 0.0 0.0 - 0.2 %   Neutrophils Relative % 48 %   Neutro Abs 1.9 1.7 - 7.7 K/uL   Lymphocytes Relative 35 %   Lymphs Abs 1.4 0.7 - 4.0 K/uL   Monocytes Relative 14 %   Monocytes Absolute 0.5 0.1 - 1.0 K/uL   Eosinophils Relative 3 %   Eosinophils Absolute 0.1 0.0 - 0.5 K/uL   Basophils Relative 0 %   Basophils Absolute 0.0 0.0 - 0.1 K/uL   Immature Granulocytes 0 %   Abs Immature Granulocytes 0.01 0.00 - 0.07 K/uL    Comment: Performed at [facility] [address] [place], [place] [address]  Comprehensive metabolic panel     Status:  Abnormal   Collection Time: 07/17/18  4:56 PM  Result Value Ref Range   Sodium 139 135 - 145 mmol/L   Potassium 3.5 3.5 - 5.1 mmol/L   Chloride 106 98 - 111 mmol/L   CO2 26 22 - 32 mmol/L   Glucose, Bld 127 (H) 70 - 99 mg/dL   BUN 18 8 - 23 mg/dL   Creatinine, Ser 0.89 0.44 - 1.00 mg/dL   Calcium 9.3 8.9 - 10.3 mg/dL   Total Protein 6.3 (L) 6.5 - 8.1 g/dL   Albumin 3.2 (L) 3.5 - 5.0 g/dL   AST 17 15 - 41 U/L   ALT 10 0 - 44 U/L   Alkaline Phosphatase 61 38 - 126 U/L   Total Bilirubin 0.3 0.3 - 1.2 mg/dL   GFR calc non Af Amer >60 >60 mL/min   GFR calc Af Amer >60 >60 mL/min   Anion gap 7 5 - 15    Comment: Performed at [facility] [address], [place], [place] [address]  POC occult blood, ED     Status: Abnormal   Collection Time: 07/17/18  6:22 PM  Result Value Ref Range   Fecal Occult Bld POSITIVE (A) NEGATIVE  Prepare RBC     Status: None   Collection Time: 07/17/18  6:43 PM  Result Value Ref Range   Order Confirmation      ORDER PROCESSED BY BLOOD BANK Performed at [facility], [place] [address], [place], [place] [address]   Vitamin B12     Status: None   Collection Time: 07/17/18  7:30 PM  Result Value Ref Range   Vitamin B-12 318 180 - 914 pg/mL    Comment: (NOTE) This assay is not validated for testing neonatal or myeloproliferative syndrome specimens for Vitamin B12 levels. Performed at [facility], [place] [address] [place], [place] [address]   Folate     Status: None   Collection Time: 07/17/18  7:30 PM  Result Value Ref Range   Folate 10.7 >5.9 ng/mL    Comment: Performed at [facility], [place] [address], [place], [place] [address]  Iron and TIBC     Status: Abnormal   Collection Time: 07/17/18  7:30 PM  Result Value Ref Range   Iron 12 (L) 28 - 170 ug/dL   TIBC 510 (H) 250 - 450 ug/dL   Saturation Ratios 2 (L) 10.4 - 31.8 %  UIBC 498 ug/dL    Comment: Performed at [facility], [place] [address] [place], [place] [address]   Ferritin     Status: Abnormal   Collection Time: 07/17/18  7:30 PM  Result Value Ref Range   Ferritin 2 (L) 11 - 307 ng/mL    Comment: Performed at [facility], [place] [address] [place], [place] [address]  Reticulocytes     Status: Abnormal   Collection Time: 07/17/18  7:30 PM  Result Value Ref Range   Retic Ct Pct 2.9 0.4 - 3.1 %   RBC. 2.48 (L) 3.87 - 5.11 MIL/uL   Retic Count, Absolute 72.7 19.0 - 186.0 K/uL   Immature Retic Fract 22.5 (H) 2.3 - 15.9 %    Comment: Performed at [facility] [address] [place], [place] [address]  Protime-INR     Status: None   Collection Time: 07/17/18  7:30 PM  Result Value Ref Range   Prothrombin Time 13.2 11.4 - 15.2 seconds   INR 1.0 0.8 - 1.2    Comment: (NOTE) INR goal varies based on device and disease states. Performed at [facility], [place] [address] [place],  [address]    No results found.  Pending Labs Unresulted Labs (From admission, onward)    Start     Ordered   07/18/18 2694  Basic metabolic panel  Tomorrow morning,   R     07/17/18 2126   07/18/18 0500  CBC  Tomorrow morning,   R     07/17/18 2126   07/17/18 2127  Hemoglobin and hematocrit, blood  Now then every 6 hours,   R     07/17/18 2126          Vitals/Pain Today's Vitals   07/17/18 1939 07/17/18 2000 07/17/18 2030 07/17/18 2130  BP: 125/63 (!) 118/57 (!) 125/44 (!) 109/52  Pulse: 92 85 93 91  Resp: 12 17 15 13   Temp:      TempSrc:      SpO2: 100% 99% 99% 96%  PainSc:        Isolation Precautions No active isolations  Medications Medications  pantoprazole (PROTONIX) 80 mg in sodium chloride 0.9 % 250 mL (0.32 mg/mL) infusion (8 mg/hr Intravenous New Bag/Given 07/17/18 1940)  0.9 %  sodium chloride infusion (has no administration in time range)  nicotine (NICODERM CQ - dosed in mg/24 hours) patch 21 mg (21 mg Transdermal Patch Applied 07/17/18 1857)  acetaminophen (TYLENOL) tablet 650 mg (has no administration in time  range)  pantoprazole (PROTONIX) 80 mg in sodium chloride 0.9 % 100 mL IVPB (0 mg Intravenous Stopped 07/17/18 2002)  ferumoxytol (FERAHEME) 510 mg in sodium chloride 0.9 % 100 mL IVPB (0 mg Intravenous Stopped 07/17/18 2040)    Mobility walks Low fall risk   Focused Assessments Pulmonary Assessment Handoff:  Lung sounds:   O2 Device: Room Air        R Recommendations: See Admitting Provider Note  Report given to:   Additional Notes:  Have not transfused yet due to blood bank not having specific type of blood needed. Blood bank states they wont have it until tomorrow. Admitting was made aware.

## 2018-07-18 NOTE — Progress Notes (Signed)
Pt admitted to 2c01, oriented to room.  VSS.  Plan of care discussed with pt.  Pt states she will not have a scope of any type and refuses to be NPO.  Discussed with pt. Pt states RN is wasting her time.

## 2018-07-18 NOTE — Plan of Care (Signed)
  Problem: Education: Goal: Knowledge of General Education information will improve Description Including pain rating scale, medication(s)/side effects and non-pharmacologic comfort measures Outcome: Progressing   

## 2018-07-19 DIAGNOSIS — R195 Other fecal abnormalities: Secondary | ICD-10-CM | POA: Diagnosis present

## 2018-07-19 LAB — CBC WITH DIFFERENTIAL/PLATELET
Abs Immature Granulocytes: 0.06 10*3/uL (ref 0.00–0.07)
Basophils Absolute: 0.1 10*3/uL (ref 0.0–0.1)
Basophils Relative: 1 %
Eosinophils Absolute: 0.2 10*3/uL (ref 0.0–0.5)
Eosinophils Relative: 4 %
HCT: 26.2 % — ABNORMAL LOW (ref 36.0–46.0)
Hemoglobin: 7.8 g/dL — ABNORMAL LOW (ref 12.0–15.0)
Immature Granulocytes: 1 %
Lymphocytes Relative: 24 %
Lymphs Abs: 1.4 10*3/uL (ref 0.7–4.0)
MCH: 23.6 pg — ABNORMAL LOW (ref 26.0–34.0)
MCHC: 29.8 g/dL — ABNORMAL LOW (ref 30.0–36.0)
MCV: 79.2 fL — ABNORMAL LOW (ref 80.0–100.0)
Monocytes Absolute: 0.7 10*3/uL (ref 0.1–1.0)
Monocytes Relative: 13 %
Neutro Abs: 3.3 10*3/uL (ref 1.7–7.7)
Neutrophils Relative %: 57 %
Platelets: 162 10*3/uL (ref 150–400)
RBC: 3.31 MIL/uL — ABNORMAL LOW (ref 3.87–5.11)
RDW: 17.6 % — ABNORMAL HIGH (ref 11.5–15.5)
WBC: 5.7 10*3/uL (ref 4.0–10.5)
nRBC: 0 % (ref 0.0–0.2)

## 2018-07-19 LAB — CBC
HCT: 25.7 % — ABNORMAL LOW (ref 36.0–46.0)
Hemoglobin: 7.9 g/dL — ABNORMAL LOW (ref 12.0–15.0)
MCH: 24.3 pg — ABNORMAL LOW (ref 26.0–34.0)
MCHC: 30.7 g/dL (ref 30.0–36.0)
MCV: 79.1 fL — ABNORMAL LOW (ref 80.0–100.0)
Platelets: 159 10*3/uL (ref 150–400)
RBC: 3.25 MIL/uL — ABNORMAL LOW (ref 3.87–5.11)
RDW: 17.7 % — ABNORMAL HIGH (ref 11.5–15.5)
WBC: 5.8 10*3/uL (ref 4.0–10.5)
nRBC: 0 % (ref 0.0–0.2)

## 2018-07-19 MED ORDER — NICOTINE 21 MG/24HR TD PT24: 21.0000 mg | MEDICATED_PATCH | Freq: Every day | TRANSDERMAL | 0 refills | Status: DC

## 2018-07-19 NOTE — Plan of Care (Signed)
  Problem: Health Behavior/Discharge Planning: Goal: Ability to manage health-related needs will improve Outcome: Progressing   Problem: Clinical Measurements: Goal: Diagnostic test results will improve Outcome: Progressing   Problem: Elimination: Goal: Will not experience complications related to bowel motility Outcome: Progressing   Problem: Safety: Goal: Ability to remain free from injury will improve Outcome: Progressing

## 2018-07-19 NOTE — Discharge Summary (Addendum)
Physician Discharge Summary  Morayma Godown QVZ:[id number] DOB: 03-20-1951 DOA: 07/17/2018  PCP: Kathyrn Lass, MD  Admit date: 07/17/2018 Discharge date: 07/19/2018  Recommendations for Outpatient Follow-up:  1. Follow up with PCP in 7-10 days. Check CBC at that time. 2. Start protonix at home 3. Stop Xarelto. 4. Start ASA 81 mg daily.   Discharge Diagnoses: Principal diagnosis is #1 1. Acute blood loss anemia 2. GI bleed 3. Chronic DVT  4. Chronic anticoagulation 5. B 12 deficiency  Discharge Condition: Fair Disposition: Home  Diet recommendation: heart healthy, soft  Filed Weights   07/18/18 0107  Weight: 69.6 kg    History of present illness:  Patient is a 68 year old Caucasian female with past medical history significant for pernicious anemia, right lower extremity DVT on Xarelto, tobacco abuse (1/2 packs daily) and remote history of possible stomach ulcer secondary to steroids about 40 years ago.  Patient may have previously undiagnosed iron deficiency.  Patient takes Excedrin intermittently.  Patient reports progressive shortness of breath over the last couple of months.  Shortness of breath became worse about 3 to 4 days prior to presentation, with associated dyspnea on exertion.  No orthopnea.  Patient was seen and the primary care provider's office due to shortness of breath and work-up done (CBC) revealed hemoglobin of 5 g/dL.  Patient was advised to come to the hospital for further assessment and management.  On presentation to the hospital, CBC revealed a hemoglobin of 4.8 g/dL.  No headache, no neck pain, no fever or chills, no URI symptoms, no chest pain, no GI symptoms and no urinary symptoms.  Patient denies hematochezia.  Patient was admitted for further assessment and management.   ED Course: Patient was transfused with 2 units of packed red blood cells by the ER team.  ER team was also consulted the GI team.  Hospitalist team has been called to admit  patient.  Hospital Course:  Patient is a 68 year old Caucasian female with past medical history significant for pernicious anemia, right lower extremity DVT on Xarelto, tobacco abuse (1/2 packs daily) and remote history of possible stomach ulcer secondary to steroids about 40 years ago. Patient may have previously undiagnosed iron deficiency. Patient takes Excedrin intermittently. Patient reports progressive shortness of breath over the last couple of months. Shortness of breath became worse about 3 to 4 days prior to presentation, with associated dyspnea on exertion. No orthopnea. Patient was seen and the primary care provider's office due to shortness of breath and work-up done (CBC) revealed hemoglobin of 5 g/dL. Patient was advised to come to the hospital for further assessment and management. On presentation to the hospital, CBC revealed a hemoglobin of 4.8 g/dL. No headache, no neck pain, no fever or chills, no URI symptoms, no chest pain, no GI symptoms and no urinary symptoms. Patient denies hematochezia. Patient was admitted for further assessment and management.   The patient was admitted to an ICU bed due to her very low hemoglobin.Patient was transfused with 2 units of packed red blood cells by the ER team. GI was consulted and the patient was seen by Dr. Winnifred Friar. The patient has refused endoscopy or colonoscopy to determine if she has a bleeding source. She was FOBT positive on 07/17/2018. GI has signed off. They recommended that the patient continue on protonix. She has received 3 units of PRBC's. Her hemoglobin this am was 7.8.  In October of 2019, the patient was diagnosed with a DVT and placed on Xarelto. She was noted to  be pancytopenic at that time. Hematology/oncology was consulted for this reason. They recommended that the patient remain on Xarelto for 6 months only, and then transition to an aspirin a day for DVT prophylaxis. We are one week shy of 6 moths. Given the patient's  severe anemia, I have stopped the Xarelto and placed the patient on an aspirin a day only.  The patient will be discharged to home today as her hemoglobin is 7.8 after receiving a third unit of PRBC's overnight.  Today's assessment: S: The patient is dressed and sitting up at bedside. She is anxious to discharge. O: Vitals:  Vitals:   07/19/18 0300 07/19/18 0741  BP: (!) 115/54 105/63  Pulse: 75 77  Resp: 15 17  Temp: (!) 97.5 F (36.4 C) 98.5 F (36.9 C)  SpO2: 98% 96%    Constitutional:   The patient is awake, alert, and oriented x 3. No acute distress. Respiratory:   No wheezes, rales, or rhonchi.  No tactile fremitus.  No increased work of breathing. Cardiovascular:   Regular rate and rhythm.  No murmurs, ectopy, or gallups.  No lateral PMI. No thrills.  No LE extremity edema    Normal pedal pulses Abdomen:   Abdomen is soft, non-tender, non-distended  Normoactive bowel sounds.  No hernias, masses, or organomegaly. Musculoskeletal:  No cyanosis, clubbing, or edema Skin:   No rashes, lesions, ulcers  palpation of skin: no induration or nodules Neurologic:   CN 2-12 intact  Sensation all 4 extremities intact Psychiatric:   Mental status ? Mood, affect appropriate ? Orientation to person, place, time   judgment and insight are poor  Discharge Instructions  Discharge Instructions    Activity as tolerated - No restrictions   Complete by:  As directed    Call MD for:  difficulty breathing, headache or visual disturbances   Complete by:  As directed    Call MD for:  extreme fatigue   Complete by:  As directed    Call MD for:  persistant dizziness or light-headedness   Complete by:  As directed    Diet - low sodium heart healthy   Complete by:  As directed    Discharge instructions   Complete by:  As directed    Follow up with PCP on 07/23/2018. CBC at that visit.   Increase activity slowly   Complete by:  As directed      Allergies  as of 07/19/2018      Reactions   Mango Flavor Other (See Comments)   Blisters   Poison Ivy Extract Other (See Comments)   Blisters   Sporanox [itraconazole] Other (See Comments)   Blisters      Medication List    STOP taking these medications   Xarelto 20 MG Tabs tablet Generic drug:  rivaroxaban     TAKE these medications   acetaminophen 325 MG tablet Commonly known as:  TYLENOL Take 2 tablets (650 mg total) by mouth every 6 (six) hours as needed for mild pain (or Fever >/= 101).   aspirin EC 81 MG tablet Take 1 tablet (81 mg total) by mouth daily.   Besivance 0.6 % Susp Generic drug:  Besifloxacin HCl Place 1 drop into the left eye See admin instructions. Instill 1 drop into left eye 4 times a day for 2 days after each monthly eye injection   CALCIUM PO Take 1 tablet by mouth daily.   cyanocobalamin 1000 MCG/ML injection Commonly known as:  (VITAMIN B-12) Daily for  7 days, then weekly for 8 weeks.   ferrous gluconate 324 MG tablet Commonly known as:  FERGON Take 1 tablet (324 mg total) by mouth daily with breakfast.   nicotine 21 mg/24hr patch Commonly known as:  NICODERM CQ - dosed in mg/24 hours Place 1 patch (21 mg total) onto the skin daily.   pantoprazole 40 MG tablet Commonly known as:  Protonix Take 1 tablet (40 mg total) by mouth daily.   VITAMIN D PO Take 1 tablet by mouth daily.     ASK your doctor about these medications   nicotine 21 mg/24hr patch Commonly known as:  NICODERM CQ - dosed in mg/24 hours Place 1 patch (21 mg total) onto the skin once for 1 dose. Ask about: Should I take this medication?      Allergies  Allergen Reactions   Mango Flavor Other (See Comments)    Blisters   Poison Ivy Extract Other (See Comments)    Blisters   Sporanox [Itraconazole] Other (See Comments)    Blisters    The results of significant diagnostics from this hospitalization (including imaging, microbiology, ancillary and laboratory) are listed  below for reference.    Significant Diagnostic Studies: No results found.  Microbiology: No results found for this or any previous visit (from the past 240 hour(s)).   Labs: Basic Metabolic Panel: Recent Labs  Lab 07/17/18 1656 07/18/18 0323  NA 139 139  K 3.5 3.4*  CL 106 108  CO2 26 22  GLUCOSE 127* 103*  BUN 18 15  CREATININE 0.89 0.78  CALCIUM 9.3 9.0   Liver Function Tests: Recent Labs  Lab 07/17/18 1656  AST 17  ALT 10  ALKPHOS 61  BILITOT 0.3  PROT 6.3*  ALBUMIN 3.2*   No results for input(s): LIPASE, AMYLASE in the last 168 hours. No results for input(s): AMMONIA in the last 168 hours. CBC: Recent Labs  Lab 07/17/18 1656 07/18/18 0323 07/18/18 1851 07/19/18 0632 07/19/18 0830  WBC 3.9* 4.3  --  5.7   5.8 DUPLICATE REQUEST  NEUTROABS 1.9  --   --  3.3 PENDING  HGB 4.8* 4.4*   4.4* 6.9* 7.8*   7.9* DUPLICATE REQUEST  HCT 65.7* 16.5*   16.6* 23.3* 84.6*   96.2* DUPLICATE REQUEST  MCV 95.2* 73.1*  --  84.1*   32.4* DUPLICATE REQUEST  PLT 401 176  --  027   253 DUPLICATE REQUEST   Cardiac Enzymes: No results for input(s): CKTOTAL, CKMB, CKMBINDEX, TROPONINI in the last 168 hours. BNP: BNP (last 3 results) Recent Labs    01/23/18 1645  BNP 157.5*    ProBNP (last 3 results) No results for input(s): PROBNP in the last 8760 hours.  CBG: No results for input(s): GLUCAP in the last 168 hours.  Active Problems:   Symptomatic anemia   DVT, lower extremity (HCC)   B12 deficiency   Heme + stool   Time coordinating discharge: 38 minutes.  Signed:        Harlon Kutner, DO [facility]  07/19/2018, 1:48 PM

## 2018-07-19 NOTE — Progress Notes (Signed)
Explained and discussed discharge instructions with pt. E-prescriptions sent to [facility]. Pt going home with belongings. daugther picking her up. No complaints at this time.

## 2018-07-19 NOTE — Discharge Instructions (Signed)
Anemia  Anemia is a condition in which you do not have enough red blood cells or hemoglobin. Hemoglobin is a substance in red blood cells that carries oxygen. When you do not have enough red blood cells or hemoglobin (are anemic), your body cannot get enough oxygen and your organs may not work properly. As a result, you may feel very tired or have other problems. What are the causes? Common causes of anemia include:  Excessive bleeding. Anemia can be caused by excessive bleeding inside or outside the body, including bleeding from the intestine or from periods in women.  Poor nutrition.  Long-lasting (chronic) kidney, thyroid, and liver disease.  Bone marrow disorders.  Cancer and treatments for cancer.  HIV (human immunodeficiency virus) and AIDS (acquired immunodeficiency syndrome).  Treatments for HIV and AIDS.  Spleen problems.  Blood disorders.  Infections, medicines, and autoimmune disorders that destroy red blood cells. What are the signs or symptoms? Symptoms of this condition include:  Minor weakness.  Dizziness.  Headache.  Feeling heartbeats that are irregular or faster than normal (palpitations).  Shortness of breath, especially with exercise.  Paleness.  Cold sensitivity.  Indigestion.  Nausea.  Difficulty sleeping.  Difficulty concentrating. Symptoms may occur suddenly or develop slowly. If your anemia is mild, you may not have symptoms. How is this diagnosed? This condition is diagnosed based on:  Blood tests.  Your medical history.  A physical exam.  Bone marrow biopsy. Your health care provider may also check your stool (feces) for blood and may do additional testing to look for the cause of your bleeding. You may also have other tests, including:  Imaging tests, such as a CT scan or MRI.  Endoscopy.  Colonoscopy. How is this treated? Treatment for this condition depends on the cause. If you continue to lose a lot of blood, you may  need to be treated at a hospital. Treatment may include:  Taking supplements of iron, vitamin M08, or folic acid.  Taking a hormone medicine (erythropoietin) that can help to stimulate red blood cell growth.  Having a blood transfusion. This may be needed if you lose a lot of blood.  Making changes to your diet.  Having surgery to remove your spleen. Follow these instructions at home:  Take over-the-counter and prescription medicines only as told by your health care provider.  Take supplements only as told by your health care provider.  Follow any diet instructions that you were given.  Keep all follow-up visits as told by your health care provider. This is important. Contact a health care provider if:  You develop new bleeding anywhere in the body. Get help right away if:  You are very weak.  You are short of breath.  You have pain in your abdomen or chest.  You are dizzy or feel faint.  You have trouble concentrating.  You have bloody or black, tarry stools.  You vomit repeatedly or you vomit up blood. Summary  Anemia is a condition in which you do not have enough red blood cells or enough of a substance in your red blood cells that carries oxygen (hemoglobin).  Symptoms may occur suddenly or develop slowly.  If your anemia is mild, you may not have symptoms.  This condition is diagnosed with blood tests as well as a medical history and physical exam. Other tests may be needed.  Treatment for this condition depends on the cause of the anemia. This information is not intended to replace advice given to you by  your health care provider. Make sure you discuss any questions you have with your health care provider. °Document Released: 04/28/2004 Document Revised: 04/22/2016 Document Reviewed: 04/22/2016 °Elsevier Interactive Patient Education © 2019 [employer] ° °

## 2018-07-19 NOTE — Progress Notes (Signed)
PROGRESS NOTE  Jodi Myers IDP:[id number] DOB: 06/29/1950 DOA: 07/17/2018 PCP: Kathyrn Lass, MD  Brief History   Patient is a 68 year old Caucasian female with past medical history significant for pernicious anemia, right lower extremity DVT on Xarelto, tobacco abuse (1/2 packs daily) and remote history of possible stomach ulcer secondary to steroids about 40 years ago.  Patient may have previously undiagnosed iron deficiency.  Patient takes Excedrin intermittently.  Patient reports progressive shortness of breath over the last couple of months.  Shortness of breath became worse about 3 to 4 days prior to presentation, with associated dyspnea on exertion.  No orthopnea.  Patient was seen and the primary care provider's office due to shortness of breath and work-up done (CBC) revealed hemoglobin of 5 g/dL.  Patient was advised to come to the hospital for further assessment and management.  On presentation to the hospital, CBC revealed a hemoglobin of 4.8 g/dL.  No headache, no neck pain, no fever or chills, no URI symptoms, no chest pain, no GI symptoms and no urinary symptoms.  Patient denies hematochezia.  Patient was admitted for further assessment and management.   The patient was admitted to an ICU bed due to her very low hemoglobin.Patient was transfused with 2 units of packed red blood cells by the ER team. GI was consulted and the patient was seen by Dr. Winnifred Friar. The patient has refused endoscopy or colonoscopy to determine if she has a bleeding source. She was FOBT positive on 07/17/2018. GI has signed off. They recommended that the patient continue on protonix. She has received 3 units of PRBC's. Her hemoglobin this am was 7.8.  In October of 2019, the patient was diagnosed with a DVT and placed on Xarelto. She was noted to be pancytopenic at that time. Hematology/oncology was consulted for this reason. They recommended that the patient remain on Xarelto for 6 months only, and then transition to an  aspirin a day for DVT prophylaxis. We are one week shy of 6 moths. Given the patient's severe anemia, I have stopped the Xarelto and placed the patient on an aspirin a day only.  The patient will be discharged to home when hemoglobin is greater than 7.0.  Consultants  . Gastroenterology  Procedures  . Transfusion of 2 units PRBC's  Antibiotics  . None  Interval History/Subjective  See above.  The patient is resting comfortably. She is expresses her desire to discharge to home ASAP.  Objective   Vitals:  Vitals:   07/19/18 0300 07/19/18 0741  BP: (!) 115/54 105/63  Pulse: 75 77  Resp: 15 17  Temp: (!) 97.5 F (36.4 C) 98.5 F (36.9 C)  SpO2: 98% 96%    Exam:  Constitutional:  . The patient is awake, alert, and oriented x 3. No acute distress. Respiratory:  . No wheezes, rales, or rhonchi. . No tactile fremitus. . No increased work of breathing. Cardiovascular:  . Regular rate and rhythm. . No murmurs, ectopy, or gallups. . No lateral PMI. No thrills. . No LE extremity edema   . Normal pedal pulses Abdomen:  . Abdomen is soft, non-tender, non-distended . Normoactive bowel sounds. . No hernias, masses, or organomegaly. Musculoskeletal:  No cyanosis, clubbing, or edema Skin:  . No rashes, lesions, ulcers . palpation of skin: no induration or nodules Neurologic:  . CN 2-12 intact . Sensation all 4 extremities intact Psychiatric:  . Mental status o Mood, affect appropriate o Orientation to person, place, time  . judgment and insight are  poor  I have personally reviewed the following:   Today's Data  . CBC, BMP, Vitals  Scheduled Meds: . sodium chloride   Intravenous Once  . nicotine  21 mg Transdermal Once   Continuous Infusions: . sodium chloride    . pantoprozole (PROTONIX) infusion 8 mg/hr (07/17/18 1940)    Problem  Heme + Stool  Dvt, Lower Extremity (Hcc)  B12 Deficiency  Symptomatic anemia GI Bleed  A & P   Symptomatic Anemia: The  patient received 2 units of PRBC's in transfusion that has resulted in a hemoglobin of 6.9 this evening. She will receive one more unit this evening, and will be discharged in the am if she has a post-transfusion hemoglobin greater than 7.0. The patient is no longer symptomatic.  GI Bleed: GI was consulted to evaluate the patient. She was heme occult positive on 07/17/2018, but she has refused endoscopy or colonoscopy. GI has signed off. Xarelto was stopped, and she was started on aspirin for DVT prophylaxis as was recommended by hematology/oncology in October of 2019.  History of DVT in a lower extremity: The patient had been placed on Xarelto in October of 2019. Heme/Onc had been consulted at that time due to the patient's pancytopenia. Dr Irene Limbo saw the patient and recommended that the patient remain on Xarelto for 6 months and then be transitioned to aspirin for DVT prophylaxis. I have stopped her Xarelto and started her on aspirin.  B 12 deficiency: Supplementation will be continued as outpatient.  I have seen and examined this patient myself. I have spent 35 minutes in here evaluation and care.  DVT prophylaxis: Aspirin Code Status: Full Code Family Communication: None present Disposition Plan: Home  Jaycob Mcclenton, DO [facility] Direct contact: see [email or web address]  7PM-7AM contact night coverage as above 07/19/2018, 12:53 PM  LOS: 2 days    LOS: 2 days

## 2018-07-20 LAB — TYPE AND SCREEN
ABO/RH(D): B POS
Antibody Screen: POSITIVE
Unit division: 0
Unit division: 0
Unit division: 0

## 2018-07-20 LAB — BPAM RBC
Blood Product Expiration Date: 202004252359
Blood Product Expiration Date: 202004302359
Blood Product Expiration Date: 202005052359
ISSUE DATE / TIME: 202004151203
ISSUE DATE / TIME: 202004151435
ISSUE DATE / TIME: 202004160057
Unit Type and Rh: 7300
Unit Type and Rh: 7300
Unit Type and Rh: 7300

## 2018-07-23 DIAGNOSIS — D510 Vitamin B12 deficiency anemia due to intrinsic factor deficiency: Secondary | ICD-10-CM | POA: Diagnosis not present

## 2018-07-23 DIAGNOSIS — D508 Other iron deficiency anemias: Secondary | ICD-10-CM | POA: Diagnosis not present

## 2018-07-23 DIAGNOSIS — Z720 Tobacco use: Secondary | ICD-10-CM | POA: Diagnosis not present

## 2018-07-23 DIAGNOSIS — Z86718 Personal history of other venous thrombosis and embolism: Secondary | ICD-10-CM | POA: Diagnosis not present

## 2018-07-30 NOTE — Progress Notes (Signed)
HEMATOLOGY/ONCOLOGY CLINIC NOTE  Date of Service: 07/31/2018  Patient Care Team: Kathyrn Lass, MD as PCP - General (Family Medicine)  CHIEF COMPLAINTS/PURPOSE OF CONSULTATION:  F/ for Pancytopenia, B12 def, iron def and DVT.  HISTORY OF PRESENTING ILLNESS:   Jodi Myers is a wonderful 68 y.o. female who has been referred to [place] by Dr. Cathlean Sauer for evaluation and management of Pancytopenia. She is accompanied today by her daughter. The pt reports that she is doing well overall.   The pt reports that she first began feeling unwell 10 days ago, beginning to feel fatigued and SOB. The pt notes that she was eating well until a few days ago, and denies any dietary restrictions. Denies thyroid problems. She recently began calcium and Vitamin D replacement for osteoporosis. She notes that her weight has been stable as have her bowel movements. She denies any fevers, chills, night sweats or unexpected weight loss  The pt notes that her ankle was swollen for the past few days, and after her VAS [place] she was placed on Xarelto.   The pt notes that her SOB has completely resolved. The pt notes that her fingers felt numb yesterday which is new for her and denies any neuropathy in her feet.   The pt had a VAS [place] on 01/23/18 which revealed Right: findings consistent with acute DVT involving the right posterior tibial vein  Most recent lab results (01/23/18) of CBC w/diff and BMP is as follows: all values are WNL except for RBC at 2.50, HGB at 8.6, HCT at 24.8, MCH at 34.4, RDW at 21.5, PLT at 39k, nRBC at 0.5%, Glucose at 120, Calcium at 8.6 01/23/18 Vitamin B12 was low at <50  On review of systems, pt reports resolving fatigue, resolved SOB, recently numb fingers, stable weight, and denies blood in the stools, blood in the urine, nose bleeds, gum bleeds, abnormal vaginal bleeding, changes in vision, changes in memory, headaches, fevers, chills, night sweats, neuropathy in feet, balance issues, abdominal  pains, and any other symptoms.   On PMHx the pt reports osteoporosis. On Social Hx the pt reports smoking 1-2 packs of cigarettes each day On Family Hx the pt reports mother with heart issues and rheumatic heart disease, sister with Hashimoto's thyroiditis and DM type I  Interval History:  Jodi Myers returns today for management and evaluation of her pernicious anemia and DVT. The patient's last visit with [place] was on 02/20/18. The pt reports that she is doing well overall.  In the interim, the pt was admitted on 07/17/18 to 07/19/18 with a HGB of 4.8. She was transfused with 3 units of PRBCs with improvement. She was taking 6 months of Xarelto for her DVT in October 2019. As she was one week shy of completing the full months, the hospitalist recommended holding Xarelto and switching to 81mg  aspirin, in the setting of her significant anemia. The pt was FOBT positive but refused GI workup. Prior to her admission, she began feeling more fatigued for two weeks.  The pt reports that she saw Dr. Rosalie Gums in GI once in the interim and notes that she did not see her PCP until just before her admission. She is continuing with monthly Vitamin B12 injections, administered by her daughter. The pt denies any leg swelling at this time. The pt notes that she continues to smoke cigarettes, smoking 1-1.5 ppd. The pt denies blood in the stools or black/tarry stools.   The pt notes that she "ate bottles of Excedrin  for years," for constant upper back pain. She has decreased her use of Excedrin and has not used any in the last few weeks, however she was using up to 2-3 Excedrin per day while on Xarelto.  Lab results today (07/31/18) of CBC w/diff is as follows: all values are WNL except for HGB at 10.7, MCH at 25.8, MCHC at 29.2, RDW at 24.6. 07/31/18 Vitamin B12 is pending 07/31/18 Ferritin is pending  On review of systems, pt reports stable energy levels, moving her bowels well, eating well, stable weight, and  denies leg swelling, blood in the stools, black stools, abdominal pains, changes in bowel habits, nausea, vomiting, changes in urination, leg swelling, calf pain, and any other symptoms.  MEDICAL HISTORY:  Past Medical History:  Diagnosis Date   Anemia 07/2018   bloood transfusion administered    Coronary artery disease     SURGICAL HISTORY: Past Surgical History:  Procedure Laterality Date   TONSILLECTOMY      SOCIAL HISTORY: Social History   Socioeconomic History   Marital status: Married    Spouse name: Not on file   Number of children: Not on file   Years of education: Not on file   Highest education level: Not on file  Occupational History   Not on file  Social Needs   Financial resource strain: Not on file   Food insecurity:    Worry: Not on file    Inability: Not on file   Transportation needs:    Medical: Not on file    Non-medical: Not on file  Tobacco Use   Smoking status: Current Every Day Smoker    Packs/day: 1.50    Years: 49.00    Pack years: 73.50    Types: Cigarettes   Smokeless tobacco: Never Used  Substance and Sexual Activity   Alcohol use: Yes    Alcohol/week: 7.0 standard drinks    Types: 7 Cans of beer per week    Comment: a beer a day   Drug use: Never   Sexual activity: Not on file  Lifestyle   Physical activity:    Days per week: Not on file    Minutes per session: Not on file   Stress: Not on file  Relationships   Social connections:    Talks on phone: Not on file    Gets together: Not on file    Attends religious service: Not on file    Active member of club or organization: Not on file    Attends meetings of clubs or organizations: Not on file    Relationship status: Not on file   Intimate partner violence:    Fear of current or ex partner: Not on file    Emotionally abused: Not on file    Physically abused: Not on file    Forced sexual activity: Not on file  Other Topics Concern   Not on file    Social History Narrative   Not on file    FAMILY HISTORY: Family History  Problem Relation Age of Onset   Diabetes Mellitus II Mother    Diabetes Mellitus I Sister    Colon cancer Neg Hx     ALLERGIES:  is allergic to mango flavor; poison ivy extract; and sporanox [itraconazole].  MEDICATIONS:  Current Outpatient Medications  Medication Sig Dispense Refill   acetaminophen (TYLENOL) 325 MG tablet Take 2 tablets (650 mg total) by mouth every 6 (six) hours as needed for mild pain (or Fever >/= 101). (Patient  not taking: Reported on 07/17/2018) 20 tablet 0   aspirin EC 81 MG tablet Take 1 tablet (81 mg total) by mouth daily. 150 tablet 2   BESIVANCE 0.6 % SUSP Place 1 drop into the left eye See admin instructions. Instill 1 drop into left eye 4 times a day for 2 days after each monthly eye injection     CALCIUM PO Take 1 tablet by mouth daily.     cyanocobalamin (,VITAMIN B-12,) 1000 MCG/ML injection Daily for 7 days, then weekly for 8 weeks. (Patient not taking: Reported on 07/17/2018) 1 mL 0   ferrous gluconate (FERGON) 324 MG tablet Take 1 tablet (324 mg total) by mouth daily with breakfast. 30 tablet 3   nicotine (NICODERM CQ - DOSED IN MG/24 HOURS) 21 mg/24hr patch Place 1 patch (21 mg total) onto the skin daily. 28 patch 0   pantoprazole (PROTONIX) 40 MG tablet Take 1 tablet (40 mg total) by mouth daily. 30 tablet 1   VITAMIN D PO Take 1 tablet by mouth daily.     No current facility-administered medications for this visit.     REVIEW OF SYSTEMS:    A 10+ POINT REVIEW OF SYSTEMS WAS OBTAINED including neurology, dermatology, psychiatry, cardiac, respiratory, lymph, extremities, GI, GU, Musculoskeletal, constitutional, breasts, reproductive, HEENT.  All pertinent positives are noted in the HPI.  All others are negative.   PHYSICAL EXAMINATION:  . Vitals:   07/31/18 1206  BP: 116/60  Pulse: 83  Resp: 18  Temp: 98.3 F (36.8 C)  SpO2: 100%   Filed Weights    07/31/18 1206  Weight: 151 lb 3.2 oz (68.6 kg)   .Body mass index is 24.4 kg/m.  GENERAL:alert, in no acute distress and comfortable SKIN: no acute rashes, no significant lesions EYES: conjunctiva are pink and non-injected, sclera anicteric OROPHARYNX: MMM, no exudates, no oropharyngeal erythema or ulceration NECK: supple, no JVD LYMPH:  no palpable lymphadenopathy in the cervical, axillary or inguinal regions LUNGS: clear to auscultation b/l with normal respiratory effort HEART: regular rate & rhythm ABDOMEN:  normoactive bowel sounds , non tender, not distended. No palpable hepatosplenomegaly.  Extremity: no pedal edema PSYCH: alert & oriented x 3 with fluent speech NEURO: no focal motor/sensory deficits   LABORATORY DATA:  I have reviewed the data as listed  . CBC Latest Ref Rng & Units 07/31/2018 07/19/2018 07/19/2018  WBC 4.0 - 54.2 K/uL 5.8 DUPLICATE REQUEST 5.7  Hemoglobin 12.0 - 15.0 g/dL 10.7(L) DUPLICATE REQUEST 7.0(W)  Hematocrit 36.0 - 23.7 % 62.8 DUPLICATE REQUEST 26.2(L)  Platelets 150 - 315 K/uL 176 DUPLICATE REQUEST 160    . CMP Latest Ref Rng & Units 07/18/2018 07/17/2018 02/20/2018  Glucose 70 - 99 mg/dL 103(H) 127(H) 93  BUN 8 - 23 mg/dL 15 18 18   Creatinine 0.44 - 1.00 mg/dL 0.78 0.89 0.89  Sodium 135 - 145 mmol/L 139 139 141  Potassium 3.5 - 5.1 mmol/L 3.4(L) 3.5 3.9  Chloride 98 - 111 mmol/L 108 106 106  CO2 22 - 32 mmol/L 22 26 26   Calcium 8.9 - 10.3 mg/dL 9.0 9.3 9.1  Total Protein 6.5 - 8.1 g/dL - 6.3(L) 6.9  Total Bilirubin 0.3 - 1.2 mg/dL - 0.3 0.4  Alkaline Phos 38 - 126 U/L - 61 95  AST 15 - 41 U/L - 17 14(L)  ALT 0 - 44 U/L - 10 8   Component     Latest Ref Rng & Units 01/23/2018 01/24/2018  Iron  28 - 170 ug/dL 299 (H)   TIBC     250 - 450 ug/dL 335   Saturation Ratios     10.4 - 31.8 % 89 (H)   UIBC     ug/dL 36   Retic Ct Pct     0.4 - 3.1 % 2.4   RBC.     3.87 - 5.11 MIL/uL 1.31 (L)   Retic Count, Absolute     19.0 - 186.0  K/uL 31.7   Immature Retic Fract     2.3 - 15.9 % 12.3   Vitamin B12     180 - 914 pg/mL <50 (L)   Folate     >5.9 ng/mL 7.5   Ferritin     11 - 307 ng/mL 66   Intrinsic Factor     0.0 - 1.1 AU/mL  18.2 (H)  Parietal Cell Antibody-IgG     0.0 - 20.0 Units  70.1 (H)    RADIOGRAPHIC STUDIES: I have personally reviewed the radiological images as listed and agreed with the findings in the report. No results found.  ASSESSMENT & PLAN:  68 y.o. female with  1. Pancytopenia likely due to severe B12 deficiency Patient's labs upon initial presentation 01/23/18, Vitamin B12 low at <50. S/p blood transfusion the pt's HGB increased from 4.9 to 8.6.   2. Severe B12 deficiency due to pernicious anemia +ve parietal cell and Intrinsic factor antibodies. 01/24/18 anti-parietal antibody and intrinsic factor antibody are both elevated consistent with pernicious anemia.  B12 upto 1500  3. Acute DVT rt posterior tibial veins. Likely triggering event - 2 packs/day of cigarette smoking.  01/23/18 VAS US revealed  Right: findings consistent with acute DVT involving the right posterior tibial vein   S/p 6 months of Xarelto  PLAN:  -Discussed pt labwork today, 07/31/18; HGB at 10.7 after 3 units of PRBCs and IV Iron -07/31/18 Vitamin B12 is 1500 Last available B12 at 07/17/18 was in 300s. -07/31/18 Ferritin is 50, but is not expected to be representative of her true levels given recency of prior IV Iron infusion -Discussed that the patient's FOBT during her most recent admission was positive, and will require GI intervention, which she has thus far been resistant to -Pt completed 6 months of Xarelto and has transitioned to an 81mg  aspirin -Anemia a combination of pernicious anemia causing B12 and iron deficiency, and a concern of GI bleeding -Continued to counsel pt towards complete smoking cessation, and discussed that this remains an ongoing risk for future blood clots -Will order one dose of IV  Injectafer with concern for ongoing slow blood loss -Advised holding PO Iron to not confuse picture of dark stools and concern for blood loss, and replacing IV as needed -Continue [place] Vitamin B12 weekly for 4 weeks, then monthly -Recommended that pt return to care with Dr. Rosalie Gums in GI for colonoscopy and endoscopy, which she notes she is "still debating." She wants to follow labs for now, and explicitly understands that this is not my recommendation -Advised seeking medical attention for blood in the stools or black, tarry stools -Prioritize following with PCP Dr. Sabra Heck -Continue Vitamin B complex 1 cap po daily  -Would recommend thyroid levels checked with next visit due to potential association with pernicious anemia -Will see the pt back in 8 weeks   Plz schedule IV Injectafer x 1 in about 1 week RTC with Dr Irene Limbo with labs in 8 weeks   All of the patients questions were answered with  apparent satisfaction. The patient knows to call the clinic with any problems, questions or concerns.  The total time spent in the appt was 30 minutes and more than 50% was on counseling and direct patient cares.    Sullivan Lone MD MS AAHIVMS [facility] Hematology/Oncology Physician [facility]  (Office):       [phone] (Work cell):  [phone] (Fax):           [phone]  07/31/2018 12:42 PM  I, Baldwin [place], am acting as a scribe for Dr. Sullivan Lone.   .I have reviewed the above documentation for accuracy and completeness, and I agree with the above. Brunetta Genera MD

## 2018-07-31 ENCOUNTER — Inpatient Hospital Stay (HOSPITAL_BASED_OUTPATIENT_CLINIC_OR_DEPARTMENT_OTHER): Payer: Medicare Other | Admitting: Hematology

## 2018-07-31 ENCOUNTER — Telehealth: Payer: Self-pay | Admitting: Hematology

## 2018-07-31 ENCOUNTER — Other Ambulatory Visit: Payer: Self-pay

## 2018-07-31 ENCOUNTER — Inpatient Hospital Stay: Payer: Medicare Other | Attending: Hematology

## 2018-07-31 VITALS — BP 116/60 | HR 83 | Temp 98.3°F | Resp 18 | Ht 66.0 in | Wt 151.2 lb

## 2018-07-31 DIAGNOSIS — Z86718 Personal history of other venous thrombosis and embolism: Secondary | ICD-10-CM | POA: Insufficient documentation

## 2018-07-31 DIAGNOSIS — Z79899 Other long term (current) drug therapy: Secondary | ICD-10-CM | POA: Diagnosis not present

## 2018-07-31 DIAGNOSIS — Z7982 Long term (current) use of aspirin: Secondary | ICD-10-CM | POA: Insufficient documentation

## 2018-07-31 DIAGNOSIS — D510 Vitamin B12 deficiency anemia due to intrinsic factor deficiency: Secondary | ICD-10-CM | POA: Diagnosis not present

## 2018-07-31 DIAGNOSIS — F17210 Nicotine dependence, cigarettes, uncomplicated: Secondary | ICD-10-CM

## 2018-07-31 DIAGNOSIS — D509 Iron deficiency anemia, unspecified: Secondary | ICD-10-CM | POA: Insufficient documentation

## 2018-07-31 DIAGNOSIS — Z7901 Long term (current) use of anticoagulants: Secondary | ICD-10-CM | POA: Insufficient documentation

## 2018-07-31 DIAGNOSIS — E538 Deficiency of other specified B group vitamins: Secondary | ICD-10-CM

## 2018-07-31 DIAGNOSIS — D61818 Other pancytopenia: Secondary | ICD-10-CM

## 2018-07-31 LAB — CBC WITH DIFFERENTIAL/PLATELET
Abs Immature Granulocytes: 0.01 10*3/uL (ref 0.00–0.07)
Basophils Absolute: 0.1 10*3/uL (ref 0.0–0.1)
Basophils Relative: 1 %
Eosinophils Absolute: 0.2 10*3/uL (ref 0.0–0.5)
Eosinophils Relative: 3 %
HCT: 36.6 % (ref 36.0–46.0)
Hemoglobin: 10.7 g/dL — ABNORMAL LOW (ref 12.0–15.0)
Immature Granulocytes: 0 %
Lymphocytes Relative: 26 %
Lymphs Abs: 1.5 10*3/uL (ref 0.7–4.0)
MCH: 25.8 pg — ABNORMAL LOW (ref 26.0–34.0)
MCHC: 29.2 g/dL — ABNORMAL LOW (ref 30.0–36.0)
MCV: 88.2 fL (ref 80.0–100.0)
Monocytes Absolute: 0.5 10*3/uL (ref 0.1–1.0)
Monocytes Relative: 9 %
Neutro Abs: 3.5 10*3/uL (ref 1.7–7.7)
Neutrophils Relative %: 61 %
Platelets: 223 10*3/uL (ref 150–400)
RBC: 4.15 MIL/uL (ref 3.87–5.11)
RDW: 24.6 % — ABNORMAL HIGH (ref 11.5–15.5)
WBC: 5.8 10*3/uL (ref 4.0–10.5)
nRBC: 0 % (ref 0.0–0.2)

## 2018-07-31 LAB — VITAMIN B12: Vitamin B-12: 1500 pg/mL — ABNORMAL HIGH (ref 180–914)

## 2018-07-31 LAB — FERRITIN: Ferritin: 50 ng/mL (ref 11–307)

## 2018-07-31 NOTE — Telephone Encounter (Signed)
Called and scheduled appt per 4/28 los.  Patient aware of appt date and time.

## 2018-08-07 ENCOUNTER — Inpatient Hospital Stay: Payer: Medicare Other | Attending: Hematology

## 2018-08-07 ENCOUNTER — Other Ambulatory Visit: Payer: Self-pay

## 2018-08-07 VITALS — BP 118/61 | HR 68 | Temp 98.6°F | Resp 18

## 2018-08-07 DIAGNOSIS — D510 Vitamin B12 deficiency anemia due to intrinsic factor deficiency: Secondary | ICD-10-CM | POA: Diagnosis not present

## 2018-08-07 DIAGNOSIS — D509 Iron deficiency anemia, unspecified: Secondary | ICD-10-CM

## 2018-08-07 MED ORDER — SODIUM CHLORIDE 0.9 % IV SOLN
750.0000 mg | Freq: Once | INTRAVENOUS | Status: AC
  Administered 2018-08-07: 750 mg via INTRAVENOUS
  Filled 2018-08-07: qty 15

## 2018-08-07 MED ORDER — SODIUM CHLORIDE 0.9 % IV SOLN
Freq: Once | INTRAVENOUS | Status: AC
  Administered 2018-08-07: 10:00:00 via INTRAVENOUS
  Filled 2018-08-07: qty 250

## 2018-08-07 NOTE — Patient Instructions (Signed)

## 2018-08-08 ENCOUNTER — Encounter (INDEPENDENT_AMBULATORY_CARE_PROVIDER_SITE_OTHER): Payer: Medicare Other | Admitting: Ophthalmology

## 2018-08-08 DIAGNOSIS — H32 Chorioretinal disorders in diseases classified elsewhere: Secondary | ICD-10-CM

## 2018-08-08 DIAGNOSIS — H318 Other specified disorders of choroid: Secondary | ICD-10-CM

## 2018-08-08 DIAGNOSIS — B399 Histoplasmosis, unspecified: Secondary | ICD-10-CM

## 2018-08-08 DIAGNOSIS — H2513 Age-related nuclear cataract, bilateral: Secondary | ICD-10-CM

## 2018-08-14 ENCOUNTER — Ambulatory Visit: Payer: Medicare Other | Admitting: Hematology

## 2018-08-14 ENCOUNTER — Other Ambulatory Visit: Payer: Medicare Other

## 2018-09-11 ENCOUNTER — Other Ambulatory Visit: Payer: Self-pay | Admitting: Hematology

## 2018-09-25 ENCOUNTER — Other Ambulatory Visit: Payer: Medicare Other

## 2018-09-25 ENCOUNTER — Ambulatory Visit: Payer: Medicare Other | Admitting: Hematology

## 2018-09-26 ENCOUNTER — Encounter (INDEPENDENT_AMBULATORY_CARE_PROVIDER_SITE_OTHER): Payer: Medicare Other | Admitting: Ophthalmology

## 2018-10-01 ENCOUNTER — Encounter (INDEPENDENT_AMBULATORY_CARE_PROVIDER_SITE_OTHER): Payer: Medicare Other | Admitting: Ophthalmology

## 2018-10-01 ENCOUNTER — Other Ambulatory Visit: Payer: Self-pay

## 2018-10-01 DIAGNOSIS — H32 Chorioretinal disorders in diseases classified elsewhere: Secondary | ICD-10-CM

## 2018-10-01 DIAGNOSIS — H318 Other specified disorders of choroid: Secondary | ICD-10-CM

## 2018-10-01 DIAGNOSIS — B399 Histoplasmosis, unspecified: Secondary | ICD-10-CM | POA: Diagnosis not present

## 2018-10-01 DIAGNOSIS — H2513 Age-related nuclear cataract, bilateral: Secondary | ICD-10-CM

## 2018-10-01 DIAGNOSIS — H43813 Vitreous degeneration, bilateral: Secondary | ICD-10-CM | POA: Diagnosis not present

## 2018-10-01 NOTE — Progress Notes (Signed)
HEMATOLOGY/ONCOLOGY CLINIC NOTE  Date of Service: 10/02/2018  Patient Care Team: Kathyrn Lass, MD as PCP - General (Family Medicine)  CHIEF COMPLAINTS/PURPOSE OF CONSULTATION:  F/ for Pancytopenia, B12 def, iron def and DVT.  HISTORY OF PRESENTING ILLNESS:   Jodi Myers is a wonderful 68 y.o. female who has been referred to [place] by Dr. Cathlean Sauer for evaluation and management of Pancytopenia. She is accompanied today by her daughter. The pt reports that she is doing well overall.   The pt reports that she first began feeling unwell 10 days ago, beginning to feel fatigued and SOB. The pt notes that she was eating well until a few days ago, and denies any dietary restrictions. Denies thyroid problems. She recently began calcium and Vitamin D replacement for osteoporosis. She notes that her weight has been stable as have her bowel movements. She denies any fevers, chills, night sweats or unexpected weight loss  The pt notes that her ankle was swollen for the past few days, and after her VAS [place] she was placed on Xarelto.   The pt notes that her SOB has completely resolved. The pt notes that her fingers felt numb yesterday which is new for her and denies any neuropathy in her feet.   The pt had a VAS [place] on 01/23/18 which revealed Right: findings consistent with acute DVT involving the right posterior tibial vein  Most recent lab results (01/23/18) of CBC w/diff and BMP is as follows: all values are WNL except for RBC at 2.50, HGB at 8.6, HCT at 24.8, MCH at 34.4, RDW at 21.5, PLT at 39k, nRBC at 0.5%, Glucose at 120, Calcium at 8.6 01/23/18 Vitamin B12 was low at <50  On review of systems, pt reports resolving fatigue, resolved SOB, recently numb fingers, stable weight, and denies blood in the stools, blood in the urine, nose bleeds, gum bleeds, abnormal vaginal bleeding, changes in vision, changes in memory, headaches, fevers, chills, night sweats, neuropathy in feet, balance issues, abdominal  pains, and any other symptoms.   On PMHx the pt reports osteoporosis. On Social Hx the pt reports smoking 1-2 packs of cigarettes each day On Family Hx the pt reports mother with heart issues and rheumatic heart disease, sister with Hashimoto's thyroiditis and DM type I  Interval History:  [name] returns today for management and evaluation of her pernicious anemia and DVT. The patient's last visit with [place] was on 07/31/18. The pt reports that she is doing well overall.  The pt reports that she has been feeling better in the interim as she has began monthly [place] Vitamin B12 and had IV iron replacement. She has ben continuing on 81mg  aspirin and denies any leg swelling or calf pain. She notes that she continues to smoke cigarettes, a little more than a pack per day.  Lab results today (10/02/18) reviewed.. 10/02/18 Ferritin is 30 and Vitamin B12 is 510.  On review of systems, pt reports improved energy levels, eating better, and denies vomiting, blood in the stools, black stools, abdominal pains, leg swelling, and any other symptoms.   MEDICAL HISTORY:  Past Medical History:  Diagnosis Date  . Anemia 07/2018   bloood transfusion administered   . Coronary artery disease     SURGICAL HISTORY: Past Surgical History:  Procedure Laterality Date  . TONSILLECTOMY      SOCIAL HISTORY: Social History   Socioeconomic History  . Marital status: Married    Spouse name: Not on file  . Number  of children: Not on file  . Years of education: Not on file  . Highest education level: Not on file  Occupational History  . Not on file  Social Needs  . Financial resource strain: Not on file  . Food insecurity    Worry: Not on file    Inability: Not on file  . Transportation needs    Medical: Not on file    Non-medical: Not on file  Tobacco Use  . Smoking status: Current Every Day Smoker    Packs/day: 1.50    Years: 49.00    Pack years: 73.50    Types: Cigarettes  . Smokeless tobacco:  Never Used  Substance and Sexual Activity  . Alcohol use: Yes    Alcohol/week: 7.0 standard drinks    Types: 7 Cans of beer per week    Comment: a beer a day  . Drug use: Never  . Sexual activity: Not on file  Lifestyle  . Physical activity    Days per week: Not on file    Minutes per session: Not on file  . Stress: Not on file  Relationships  . Social Herbalist on phone: Not on file    Gets together: Not on file    Attends religious service: Not on file    Active member of club or organization: Not on file    Attends meetings of clubs or organizations: Not on file    Relationship status: Not on file  . Intimate partner violence    Fear of current or ex partner: Not on file    Emotionally abused: Not on file    Physically abused: Not on file    Forced sexual activity: Not on file  Other Topics Concern  . Not on file  Social History Narrative  . Not on file    FAMILY HISTORY: Family History  Problem Relation Age of Onset  . Diabetes Mellitus II Mother   . Diabetes Mellitus I Sister   . Colon cancer Neg Hx     ALLERGIES:  is allergic to mango flavor; poison ivy extract; and sporanox [itraconazole].  MEDICATIONS:  Current Outpatient Medications  Medication Sig Dispense Refill  . acetaminophen (TYLENOL) 325 MG tablet Take 2 tablets (650 mg total) by mouth every 6 (six) hours as needed for mild pain (or Fever >/= 101). (Patient not taking: Reported on 07/17/2018) 20 tablet 0  . aspirin EC 81 MG tablet Take 1 tablet (81 mg total) by mouth daily. 150 tablet 2  . BESIVANCE 0.6 % SUSP Place 1 drop into the left eye See admin instructions. Instill 1 drop into left eye 4 times a day for 2 days after each monthly eye injection    . CALCIUM PO Take 1 tablet by mouth daily.    . cyanocobalamin (,VITAMIN B-12,) 1000 MCG/ML injection Daily for 7 days, then weekly for 8 weeks. (Patient not taking: Reported on 07/17/2018) 1 mL 0  . ferrous gluconate (FERGON) 324 MG tablet  Take 1 tablet (324 mg total) by mouth daily with breakfast. 30 tablet 3  . nicotine (NICODERM CQ - DOSED IN MG/24 HOURS) 21 mg/24hr patch Place 1 patch (21 mg total) onto the skin daily. 28 patch 0  . pantoprazole (PROTONIX) 40 MG tablet TAKE 1 TABLET BY MOUTH EVERY DAY 30 tablet 1  . VITAMIN D PO Take 1 tablet by mouth daily.     No current facility-administered medications for this visit.     REVIEW  OF SYSTEMS:    A 10+ POINT REVIEW OF SYSTEMS WAS OBTAINED including neurology, dermatology, psychiatry, cardiac, respiratory, lymph, extremities, GI, GU, Musculoskeletal, constitutional, breasts, reproductive, HEENT.  All pertinent positives are noted in the HPI.  All others are negative.   PHYSICAL EXAMINATION:  Vitals:   10/02/18 1524  BP: 130/79  Pulse: 89  Resp: 18  Temp: 98.7 F (37.1 C)  SpO2: 99%   Filed Weights   10/02/18 1524  Weight: 149 lb 8 oz (67.8 kg)   .Body mass index is 24.13 kg/m.  GENERAL:alert, in no acute distress and comfortable SKIN: no acute rashes, no significant lesions EYES: conjunctiva are pink and non-injected, sclera anicteric OROPHARYNX: MMM, no exudates, no oropharyngeal erythema or ulceration NECK: supple, no JVD LYMPH:  no palpable lymphadenopathy in the cervical, axillary or inguinal regions LUNGS: clear to auscultation b/l with normal respiratory effort HEART: regular rate & rhythm ABDOMEN:  normoactive bowel sounds , non tender, not distended. No palpable hepatosplenomegaly.  Extremity: no pedal edema PSYCH: alert & oriented x 3 with fluent speech NEURO: no focal motor/sensory deficits   LABORATORY DATA:  I have reviewed the data as listed  . CBC Latest Ref Rng & Units 10/02/2018 07/31/2018 07/19/2018  WBC 4.0 - 10.5 K/uL 6.5 5.8 DUPLICATE REQUEST  Hemoglobin 12.0 - 15.0 g/dL 15.5(H) 10.7(L) DUPLICATE REQUEST  Hematocrit 36.0 - 46.0 % 47.6(H) 15.1 DUPLICATE REQUEST  Platelets 150 - 400 K/uL 761 607 DUPLICATE REQUEST    . CMP Latest  Ref Rng & Units 10/02/2018 07/18/2018 07/17/2018  Glucose 70 - 99 mg/dL 106(H) 103(H) 127(H)  BUN 8 - 23 mg/dL 15 15 18   Creatinine 0.44 - 1.00 mg/dL 1.01(H) 0.78 0.89  Sodium 135 - 145 mmol/L 139 139 139  Potassium 3.5 - 5.1 mmol/L 3.9 3.4(L) 3.5  Chloride 98 - 111 mmol/L 103 108 106  CO2 22 - 32 mmol/L 25 22 26   Calcium 8.9 - 10.3 mg/dL 9.8 9.0 9.3  Total Protein 6.5 - 8.1 g/dL 7.8 - 6.3(L)  Total Bilirubin 0.3 - 1.2 mg/dL 0.3 - 0.3  Alkaline Phos 38 - 126 U/L 73 - 61  AST 15 - 41 U/L 16 - 17  ALT 0 - 44 U/L 12 - 10   Component     Latest Ref Rng & Units 01/23/2018 01/24/2018  Iron     28 - 170 ug/dL 299 (H)   TIBC     250 - 450 ug/dL 335   Saturation Ratios     10.4 - 31.8 % 89 (H)   UIBC     ug/dL 36   Retic Ct Pct     0.4 - 3.1 % 2.4   RBC.     3.87 - 5.11 MIL/uL 1.31 (L)   Retic Count, Absolute     19.0 - 186.0 K/uL 31.7   Immature Retic Fract     2.3 - 15.9 % 12.3   Vitamin B12     180 - 914 pg/mL <50 (L)   Folate     >5.9 ng/mL 7.5   Ferritin     11 - 307 ng/mL 66   Intrinsic Factor     0.0 - 1.1 AU/mL  18.2 (H)  Parietal Cell Antibody-IgG     0.0 - 20.0 Units  70.1 (H)   . Lab Results  Component Value Date   IRON 59 10/02/2018   TIBC 359 10/02/2018   IRONPCTSAT 16 (L) 10/02/2018   (Iron and TIBC)  Lab Results  Component Value Date   FERRITIN 30 10/02/2018    RADIOGRAPHIC STUDIES: I have personally reviewed the radiological images as listed and agreed with the findings in the report. No results found.  ASSESSMENT & PLAN:   68 y.o. female with  1. Pancytopenia likely due to severe B12 deficiency Patient's labs upon initial presentation 01/23/18, Vitamin B12 low at <50. S/p blood transfusion the pt's HGB increased from 4.9 to 8.6.   Anemia a combination of pernicious anemia causing B12 and iron deficiency, and a concern of GI bleeding  2. Severe B12 deficiency due to pernicious anemia +ve parietal cell and Intrinsic factor antibodies.  01/24/18 anti-parietal antibody and intrinsic factor antibody are both elevated consistent with pernicious anemia.  B12 upto 1500  3. Acute DVT rt posterior tibial veins. Likely triggering event - 2 packs/day of cigarette smoking.  4. Iron deficiency  01/23/18 VAS US revealed  Right: findings consistent with acute DVT involving the right posterior tibial vein   S/p 6 months of Xarelto  4. Septated cyst in right kidney As seen on 01/24/18 CT A/P Ordering repeat US for further characterization  PLAN: -Discussed pt labwork today, 10/02/18; HGB has normalized after having IV iron and [place] Vitamin B12 replacement -10/02/18 Ferritin 30and Vitamin B12 is 510. -Continue monthly [place] Vitamin B12 -Continue 81mg  aspirin -Goal for Ferritin >100. Will order 1 dose of IV Injectafer is patient agreeable. -Discussed that the patient's FOBT during her most recent admission was positive, and will require GI intervention, which she has thus far been resistant to -Advised holding PO Iron to not confuse picture of dark stools and concern for blood loss, and replacing IV as needed -Recommended that pt return to care with Dr. Rosalie Gums in GI for colonoscopy and endoscopy, which she notes she is "still debating." She wants to follow labs for now, and explicitly understands that this is not my recommendation -Advised seeking medical attention for blood in the stools or black, tarry stools -Prioritize following with PCP Dr. Sabra Heck -Continued to counsel pt towards complete smoking cessation, and discussed that this remains an ongoing risk for future blood clots -Continue Vitamin B complex 1 cap po daily  -Would recommend thyroid levels checked with next visit due to potential association with pernicious anemia -Will see the pt back in 4 months   US renal in 16 weeks RTC with Dr Irene Limbo in 4 months   All of the patients questions were answered with apparent satisfaction. The patient knows to call the clinic with  any problems, questions or concerns.  The total time spent in the appt was 25 minutes and more than 50% was on counseling and direct patient cares.    Sullivan Lone MD MS AAHIVMS [facility] Hematology/Oncology Physician [facility]  (Office):       [phone] (Work cell):  [phone] (Fax):           [phone]  10/02/2018 3:59 PM  I, Baldwin Jamaica, am acting as a scribe for Dr. Sullivan Lone.   .I have reviewed the above documentation for accuracy and completeness, and I agree with the above. Brunetta Genera MD

## 2018-10-02 ENCOUNTER — Other Ambulatory Visit: Payer: Self-pay

## 2018-10-02 ENCOUNTER — Inpatient Hospital Stay (HOSPITAL_BASED_OUTPATIENT_CLINIC_OR_DEPARTMENT_OTHER): Payer: Medicare Other | Admitting: Hematology

## 2018-10-02 ENCOUNTER — Telehealth: Payer: Self-pay | Admitting: Hematology

## 2018-10-02 ENCOUNTER — Inpatient Hospital Stay: Payer: Medicare Other | Attending: Hematology

## 2018-10-02 VITALS — BP 130/79 | HR 89 | Temp 98.7°F | Resp 18 | Ht 66.0 in | Wt 149.5 lb

## 2018-10-02 DIAGNOSIS — Z8249 Family history of ischemic heart disease and other diseases of the circulatory system: Secondary | ICD-10-CM | POA: Insufficient documentation

## 2018-10-02 DIAGNOSIS — Z833 Family history of diabetes mellitus: Secondary | ICD-10-CM | POA: Diagnosis not present

## 2018-10-02 DIAGNOSIS — Z7289 Other problems related to lifestyle: Secondary | ICD-10-CM | POA: Diagnosis not present

## 2018-10-02 DIAGNOSIS — Z79899 Other long term (current) drug therapy: Secondary | ICD-10-CM | POA: Diagnosis not present

## 2018-10-02 DIAGNOSIS — R5383 Other fatigue: Secondary | ICD-10-CM | POA: Diagnosis not present

## 2018-10-02 DIAGNOSIS — E611 Iron deficiency: Secondary | ICD-10-CM

## 2018-10-02 DIAGNOSIS — Z883 Allergy status to other anti-infective agents status: Secondary | ICD-10-CM

## 2018-10-02 DIAGNOSIS — M810 Age-related osteoporosis without current pathological fracture: Secondary | ICD-10-CM

## 2018-10-02 DIAGNOSIS — F17210 Nicotine dependence, cigarettes, uncomplicated: Secondary | ICD-10-CM

## 2018-10-02 DIAGNOSIS — D509 Iron deficiency anemia, unspecified: Secondary | ICD-10-CM

## 2018-10-02 DIAGNOSIS — Z7901 Long term (current) use of anticoagulants: Secondary | ICD-10-CM | POA: Diagnosis not present

## 2018-10-02 DIAGNOSIS — D510 Vitamin B12 deficiency anemia due to intrinsic factor deficiency: Secondary | ICD-10-CM | POA: Diagnosis not present

## 2018-10-02 DIAGNOSIS — R0602 Shortness of breath: Secondary | ICD-10-CM | POA: Insufficient documentation

## 2018-10-02 DIAGNOSIS — N281 Cyst of kidney, acquired: Secondary | ICD-10-CM | POA: Diagnosis not present

## 2018-10-02 DIAGNOSIS — I82441 Acute embolism and thrombosis of right tibial vein: Secondary | ICD-10-CM | POA: Insufficient documentation

## 2018-10-02 DIAGNOSIS — E538 Deficiency of other specified B group vitamins: Secondary | ICD-10-CM

## 2018-10-02 DIAGNOSIS — I2510 Atherosclerotic heart disease of native coronary artery without angina pectoris: Secondary | ICD-10-CM | POA: Diagnosis not present

## 2018-10-02 DIAGNOSIS — Z832 Family history of diseases of the blood and blood-forming organs and certain disorders involving the immune mechanism: Secondary | ICD-10-CM | POA: Diagnosis not present

## 2018-10-02 DIAGNOSIS — D61818 Other pancytopenia: Secondary | ICD-10-CM | POA: Insufficient documentation

## 2018-10-02 LAB — CBC WITH DIFFERENTIAL (CANCER CENTER ONLY)
Abs Immature Granulocytes: 0.01 10*3/uL (ref 0.00–0.07)
Basophils Absolute: 0 10*3/uL (ref 0.0–0.1)
Basophils Relative: 1 %
Eosinophils Absolute: 0.3 10*3/uL (ref 0.0–0.5)
Eosinophils Relative: 5 %
HCT: 47.6 % — ABNORMAL HIGH (ref 36.0–46.0)
Hemoglobin: 15.5 g/dL — ABNORMAL HIGH (ref 12.0–15.0)
Immature Granulocytes: 0 %
Lymphocytes Relative: 28 %
Lymphs Abs: 1.8 10*3/uL (ref 0.7–4.0)
MCH: 29 pg (ref 26.0–34.0)
MCHC: 32.6 g/dL (ref 30.0–36.0)
MCV: 89 fL (ref 80.0–100.0)
Monocytes Absolute: 0.6 10*3/uL (ref 0.1–1.0)
Monocytes Relative: 10 %
Neutro Abs: 3.7 10*3/uL (ref 1.7–7.7)
Neutrophils Relative %: 56 %
Platelet Count: 174 10*3/uL (ref 150–400)
RBC: 5.35 MIL/uL — ABNORMAL HIGH (ref 3.87–5.11)
RDW: 17.6 % — ABNORMAL HIGH (ref 11.5–15.5)
WBC Count: 6.5 10*3/uL (ref 4.0–10.5)
nRBC: 0 % (ref 0.0–0.2)

## 2018-10-02 LAB — CMP (CANCER CENTER ONLY)
ALT: 12 U/L (ref 0–44)
AST: 16 U/L (ref 15–41)
Albumin: 3.9 g/dL (ref 3.5–5.0)
Alkaline Phosphatase: 73 U/L (ref 38–126)
Anion gap: 11 (ref 5–15)
BUN: 15 mg/dL (ref 8–23)
CO2: 25 mmol/L (ref 22–32)
Calcium: 9.8 mg/dL (ref 8.9–10.3)
Chloride: 103 mmol/L (ref 98–111)
Creatinine: 1.01 mg/dL — ABNORMAL HIGH (ref 0.44–1.00)
GFR, Est AFR Am: >60 mL/min (ref >60)
GFR, Estimated: 57 mL/min — ABNORMAL LOW (ref >60)
Glucose, Bld: 106 mg/dL — ABNORMAL HIGH (ref 70–99)
Potassium: 3.9 mmol/L (ref 3.5–5.1)
Sodium: 139 mmol/L (ref 135–145)
Total Bilirubin: 0.3 mg/dL (ref 0.3–1.2)
Total Protein: 7.8 g/dL (ref 6.5–8.1)

## 2018-10-02 LAB — VITAMIN B12: Vitamin B-12: 510 pg/mL (ref 180–914)

## 2018-10-02 NOTE — Telephone Encounter (Signed)
Scheduled per los. Patient declined printout  

## 2018-10-03 LAB — FERRITIN: Ferritin: 30 ng/mL (ref 11–307)

## 2018-10-03 LAB — IRON AND TIBC
Iron: 59 ug/dL (ref 41–142)
Saturation Ratios: 16 % — ABNORMAL LOW (ref 21–57)
TIBC: 359 ug/dL (ref 236–444)
UIBC: 300 ug/dL (ref 120–384)

## 2018-10-11 ENCOUNTER — Telehealth: Payer: Self-pay | Admitting: *Deleted

## 2018-10-11 ENCOUNTER — Telehealth: Payer: Self-pay | Admitting: Hematology

## 2018-10-11 NOTE — Telephone Encounter (Signed)
-----   Message from Brunetta Genera, MD sent at 10/05/2018 12:33 AM EDT ----- plz let patient know ferritin is 30. Goal is >100. Would recommend 1 additional dose of IV Iron. Orders in. Plz send scheduling msg if patient agreeable. thx GK

## 2018-10-11 NOTE — Telephone Encounter (Signed)
Scheduled appt per 7/09 sch message - pt aware of appt date and time .  

## 2018-10-11 NOTE — Telephone Encounter (Signed)
Contacted patient regarding test results per Dr. Grier Mitts directions - see note. Patient is in agreement to have one unit of IV iron. Schedule message sent.

## 2018-10-17 ENCOUNTER — Inpatient Hospital Stay: Payer: Medicare Other | Attending: Hematology

## 2018-10-17 ENCOUNTER — Other Ambulatory Visit: Payer: Self-pay

## 2018-10-17 VITALS — BP 110/58 | HR 72 | Temp 97.9°F | Resp 16

## 2018-10-17 DIAGNOSIS — Z86718 Personal history of other venous thrombosis and embolism: Secondary | ICD-10-CM | POA: Diagnosis not present

## 2018-10-17 DIAGNOSIS — E611 Iron deficiency: Secondary | ICD-10-CM | POA: Insufficient documentation

## 2018-10-17 DIAGNOSIS — Z833 Family history of diabetes mellitus: Secondary | ICD-10-CM | POA: Insufficient documentation

## 2018-10-17 DIAGNOSIS — N281 Cyst of kidney, acquired: Secondary | ICD-10-CM | POA: Diagnosis not present

## 2018-10-17 DIAGNOSIS — D510 Vitamin B12 deficiency anemia due to intrinsic factor deficiency: Secondary | ICD-10-CM | POA: Diagnosis not present

## 2018-10-17 DIAGNOSIS — D509 Iron deficiency anemia, unspecified: Secondary | ICD-10-CM

## 2018-10-17 DIAGNOSIS — Z7901 Long term (current) use of anticoagulants: Secondary | ICD-10-CM | POA: Diagnosis not present

## 2018-10-17 DIAGNOSIS — Z7289 Other problems related to lifestyle: Secondary | ICD-10-CM | POA: Diagnosis not present

## 2018-10-17 DIAGNOSIS — F17210 Nicotine dependence, cigarettes, uncomplicated: Secondary | ICD-10-CM | POA: Insufficient documentation

## 2018-10-17 DIAGNOSIS — Z79899 Other long term (current) drug therapy: Secondary | ICD-10-CM | POA: Insufficient documentation

## 2018-10-17 DIAGNOSIS — D61818 Other pancytopenia: Secondary | ICD-10-CM | POA: Insufficient documentation

## 2018-10-17 MED ORDER — SODIUM CHLORIDE 0.9 % IV SOLN
750.0000 mg | Freq: Once | INTRAVENOUS | Status: AC
  Administered 2018-10-17: 750 mg via INTRAVENOUS
  Filled 2018-10-17: qty 15

## 2018-10-17 MED ORDER — SODIUM CHLORIDE 0.9 % IV SOLN
Freq: Once | INTRAVENOUS | Status: AC
  Administered 2018-10-17: 09:00:00 via INTRAVENOUS
  Filled 2018-10-17: qty 250

## 2018-10-17 NOTE — Patient Instructions (Signed)
Ferric carboxymaltose injection What is this medicine? FERRIC CARBOXYMALTOSE (ferr-ik car-box-ee-mol-toes) is an iron complex. Iron is used to make healthy red blood cells, which carry oxygen and nutrients throughout the body. This medicine is used to treat anemia in people with chronic kidney disease or people who cannot take iron by mouth. This medicine may be used for other purposes; ask your health care provider or pharmacist if you have questions. COMMON BRAND NAME(S): Injectafer What should I tell my health care provider before I take this medicine? They need to know if you have any of these conditions:  high levels of iron in the blood  liver disease  an unusual or allergic reaction to iron, other medicines, foods, dyes, or preservatives  pregnant or trying to get pregnant  breast-feeding How should I use this medicine? This medicine is for infusion into a vein. It is given by a health care professional in a hospital or clinic setting. Talk to your pediatrician regarding the use of this medicine in children. Special care may be needed. Overdosage: If you think you have taken too much of this medicine contact a poison control center or emergency room at once. NOTE: This medicine is only for you. Do not share this medicine with others. What if I miss a dose? It is important not to miss your dose. Call your doctor or health care professional if you are unable to keep an appointment. What may interact with this medicine? Do not take this medicine with any of the following medications:  deferoxamine  dimercaprol  other iron products This list may not describe all possible interactions. Give your health care provider a list of all the medicines, herbs, non-prescription drugs, or dietary supplements you use. Also tell them if you smoke, drink alcohol, or use illegal drugs. Some items may interact with your medicine. What should I watch for while using this medicine? Visit your  doctor or health care professional regularly. Tell your doctor if your symptoms do not start to get better or if they get worse. You may need blood work done while you are taking this medicine. You may need to follow a special diet. Talk to your doctor. Foods that contain iron include: whole grains/cereals, dried fruits, beans, or peas, leafy green vegetables, and organ meats (liver, kidney). What side effects may I notice from receiving this medicine? Side effects that you should report to your doctor or health care professional as soon as possible:  allergic reactions like skin rash, itching or hives, swelling of the face, lips, or tongue  dizziness  facial flushing Side effects that usually do not require medical attention (report to your doctor or health care professional if they continue or are bothersome):  changes in taste  constipation  headache  nausea, vomiting  pain, redness, or irritation at site where injected This list may not describe all possible side effects. Call your doctor for medical advice about side effects. You may report side effects to FDA at 1-800-FDA-1088. Where should I keep my medicine? This drug is given in a hospital or clinic and will not be stored at home. NOTE: This sheet is a summary. It may not cover all possible information. If you have questions about this medicine, talk to your doctor, pharmacist, or health care provider.  2020 Elsevier/Gold Standard (2016-05-05 09:40:29)  Coronavirus (COVID-19) Are you at risk?  Are you at risk for the Coronavirus (COVID-19)?  To be considered HIGH RISK for Coronavirus (COVID-19), you have to meet the following   criteria:  . Traveled to [place], [place], [place], [place] or [place]; or in the [place] to [place], [place], [place], or [place]; and have fever, cough, and shortness of breath within the last 2 weeks of travel OR . Been in close contact with a person diagnosed with COVID-19 within the  last 2 weeks and have fever, cough, and shortness of breath . IF YOU DO NOT MEET THESE CRITERIA, YOU ARE CONSIDERED LOW RISK FOR COVID-19.  What to do if you are HIGH RISK for COVID-19?  . If you are having a medical emergency, call 911. . Seek medical care right away. Before you go to a doctor's office, urgent care or emergency department, call ahead and tell them about your recent travel, contact with someone diagnosed with COVID-19, and your symptoms. You should receive instructions from your physician's office regarding next steps of care.  . When you arrive at healthcare provider, tell the healthcare staff immediately you have returned from visiting [place], [place], [place], [place] or [place]; or traveled in the [place] to [place], [place], [place], or [place]; in the last two weeks or you have been in close contact with a person diagnosed with COVID-19 in the last 2 weeks.   . Tell the health care staff about your symptoms: fever, cough and shortness of breath. . After you have been seen by a medical provider, you will be either: o Tested for (COVID-19) and discharged home on quarantine except to seek medical care if symptoms worsen, and asked to  - Stay home and avoid contact with others until you get your results (4-5 days)  - Avoid travel on public transportation if possible (such as bus, train, or airplane) or o Sent to the Emergency Department by EMS for evaluation, COVID-19 testing, and possible admission depending on your condition and test results.  What to do if you are LOW RISK for COVID-19?  Reduce your risk of any infection by using the same precautions used for avoiding the common cold or flu:  . Wash your hands often with soap and warm water for at least 20 seconds.  If soap and water are not readily available, use an alcohol-based hand sanitizer with at least 60% alcohol.  . If coughing or sneezing, cover your mouth and nose by coughing or sneezing into the elbow  areas of your shirt or coat, into a tissue or into your sleeve (not your hands). . Avoid shaking hands with others and consider head nods or verbal greetings only. . Avoid touching your eyes, nose, or mouth with unwashed hands.  . Avoid close contact with people who are sick. . Avoid places or events with large numbers of people in one location, like concerts or sporting events. . Carefully consider travel plans you have or are making. . If you are planning any travel outside or inside the [place], visit the CDC's Travelers' Health webpage for the latest health notices. . If you have some symptoms but not all symptoms, continue to monitor at home and seek medical attention if your symptoms worsen. . If you are having a medical emergency, call 911.   ADDITIONAL HEALTHCARE OPTIONS FOR PATIENTS  [facility] / e-Visit: [email or web address]         [facility]: [phone]   Urgent Care: [phone]                   [facility]: [phone]   

## 2018-10-17 NOTE — Progress Notes (Signed)
Pt declined to stay for 30 min post iron observation period. Vitals taken and stable. Pt denied any CP, SOB, or headache. Ambulated out of clinic without incident

## 2018-10-25 ENCOUNTER — Encounter: Payer: Self-pay | Admitting: Physician Assistant

## 2018-10-25 ENCOUNTER — Other Ambulatory Visit: Payer: Self-pay

## 2018-10-25 ENCOUNTER — Ambulatory Visit (INDEPENDENT_AMBULATORY_CARE_PROVIDER_SITE_OTHER): Payer: Medicare Other | Admitting: Physician Assistant

## 2018-10-25 VITALS — BP 121/79 | HR 93 | Temp 98.5°F | Ht 66.0 in | Wt 146.4 lb

## 2018-10-25 DIAGNOSIS — D509 Iron deficiency anemia, unspecified: Secondary | ICD-10-CM

## 2018-10-25 DIAGNOSIS — E538 Deficiency of other specified B group vitamins: Secondary | ICD-10-CM | POA: Diagnosis not present

## 2018-10-25 DIAGNOSIS — M810 Age-related osteoporosis without current pathological fracture: Secondary | ICD-10-CM

## 2018-10-25 DIAGNOSIS — Z720 Tobacco use: Secondary | ICD-10-CM | POA: Diagnosis not present

## 2018-10-25 DIAGNOSIS — E559 Vitamin D deficiency, unspecified: Secondary | ICD-10-CM | POA: Insufficient documentation

## 2018-10-25 MED ORDER — CYANOCOBALAMIN 1000 MCG/ML IJ SOLN: 1000.0000 ug | INTRAMUSCULAR | 0 refills | Status: DC

## 2018-10-25 MED ORDER — ASPIRIN EC 81 MG PO TBEC: 81.0000 mg | DELAYED_RELEASE_TABLET | Freq: Every day | ORAL | 2 refills | Status: DC

## 2018-10-25 MED ORDER — PANTOPRAZOLE SODIUM 40 MG PO TBEC: 40.0000 mg | DELAYED_RELEASE_TABLET | Freq: Every day | ORAL | 1 refills | Status: DC

## 2018-10-25 NOTE — Patient Instructions (Signed)
It was great to meet you!  Let's follow-up in about 2 weeks for your medicare physical.  Take care,  Inda Coke PA-C

## 2018-10-25 NOTE — Progress Notes (Signed)
Jodi Myers is a 68 y.o. female here to [place].   History of Present Illness:   Chief Complaint  Patient presents with  . Establish Care   Patient is here to establish care today.   She is currently being followed by Dr. Irene Limbo in oncology for management of pancytopenia. She has been getting intermittent iron transfusions over the past few months, as directed by Dr. Irene Limbo. She has been advised to get a colonoscopy but she has extreme reservations. She also discloses to me that she has history of taking 8 Excedrin Migraine pills daily x 40-50 years. She just recently stopped this in April of this year.  She has very heavy smoking history -- 1.5 PPD x 50 years. She has no interest in cessation at this time.  Has history of osteoporosis, states that she found out about two years ago. We do not have record of this. She states that she was recommended to take medications and even consider injections but she declined. Denies any fx ever.  She also has severe B12 deficiency and requires injections which her daughter administers for her.  Lab Results  Component Value Date   VITAMINB12 510 10/02/2018     Weight -- Weight: 146 lb 6.4 oz (66.4 kg)    No flowsheet data found.  No flowsheet data found.   Other providers/specialists: Patient Care Team: Kathyrn Lass, MD as PCP - General (Family Medicine)   Past Medical History:  Diagnosis Date  . Anemia 07/2018   bloood transfusion administered   . Coronary artery disease      Social History   Socioeconomic History  . Marital status: Married    Spouse name: Not on file  . Number of children: Not on file  . Years of education: Not on file  . Highest education level: Not on file  Occupational History  . Not on file  Social Needs  . Financial resource strain: Not on file  . Food insecurity    Worry: Not on file    Inability: Not on file  . Transportation needs    Medical: Not on file    Non-medical: Not on file   Tobacco Use  . Smoking status: Current Every Day Smoker    Packs/day: 1.50    Years: 49.00    Pack years: 73.50    Types: Cigarettes  . Smokeless tobacco: Never Used  Substance and Sexual Activity  . Alcohol use: Yes    Alcohol/week: 7.0 standard drinks    Types: 7 Cans of beer per week    Comment: a beer a day  . Drug use: Never  . Sexual activity: Not on file  Lifestyle  . Physical activity    Days per week: Not on file    Minutes per session: Not on file  . Stress: Not on file  Relationships  . Social Herbalist on phone: Not on file    Gets together: Not on file    Attends religious service: Not on file    Active member of club or organization: Not on file    Attends meetings of clubs or organizations: Not on file    Relationship status: Not on file  . Intimate partner violence    Fear of current or ex partner: Not on file    Emotionally abused: Not on file    Physically abused: Not on file    Forced sexual activity: Not on file  Other Topics Concern  . Not  on file  Social History Narrative  . Not on file    Past Surgical History:  Procedure Laterality Date  . TONSILLECTOMY      Family History  Problem Relation Age of Onset  . Diabetes Mellitus II Mother   . Diabetes Mellitus I Sister   . Colon cancer Neg Hx     Allergies  Allergen Reactions  . Mango Flavor Other (See Comments)    Blisters  . Poison Ivy Extract Other (See Comments)    Blisters  . Sporanox [Itraconazole] Other (See Comments)    Blisters     Current Medications:   Current Outpatient Medications:  .  acetaminophen (TYLENOL) 325 MG tablet, Take 2 tablets (650 mg total) by mouth every 6 (six) hours as needed for mild pain (or Fever >/= 101)., Disp: 20 tablet, Rfl: 0 .  aspirin EC 81 MG tablet, Take 1 tablet (81 mg total) by mouth daily., Disp: 150 tablet, Rfl: 2 .  BESIVANCE 0.6 % SUSP, Place 1 drop into the left eye See admin instructions. Instill 1 drop into left eye 4  times a day for 2 days after each monthly eye injection, Disp: , Rfl:  .  CALCIUM PO, Take 1 tablet by mouth daily., Disp: , Rfl:  .  cyanocobalamin (,VITAMIN B-12,) 1000 MCG/ML injection, Inject 1 mL (1,000 mcg total) into the muscle every 30 (thirty) days. Daily for 7 days, then weekly for 8 weeks., Disp: 1 mL, Rfl: 0 .  VITAMIN D PO, Take 1 tablet by mouth daily., Disp: , Rfl:  .  pantoprazole (PROTONIX) 40 MG tablet, Take 1 tablet (40 mg total) by mouth daily., Disp: 90 tablet, Rfl: 1   Review of Systems:   ROS  Negative unless otherwise specified per HPI.   Vitals:   Vitals:   10/25/18 0953  BP: 121/79  Pulse: 93  Temp: 98.5 F (36.9 C)  SpO2: 96%  Weight: 146 lb 6.4 oz (66.4 kg)  Height: 5\' 6"  (1.676 m)      Body mass index is 23.63 kg/m.  Physical Exam:   Physical Exam Vitals signs and nursing note reviewed.  Constitutional:      General: She is not in acute distress.    Appearance: She is well-developed. She is not ill-appearing or toxic-appearing.  Cardiovascular:     Rate and Rhythm: Normal rate and regular rhythm.     Pulses: Normal pulses.     Heart sounds: Normal heart sounds, S1 normal and S2 normal.     Comments: No LE edema Pulmonary:     Effort: Pulmonary effort is normal.     Breath sounds: Normal breath sounds.  Skin:    General: Skin is warm and dry.  Neurological:     Mental Status: She is alert.     GCS: GCS eye subscore is 4. GCS verbal subscore is 5. GCS motor subscore is 6.  Psychiatric:        Speech: Speech normal.        Behavior: Behavior normal. Behavior is cooperative.      Assessment and Plan:   [place] was seen today for establish care.  Diagnoses and all orders for this visit:  Iron deficiency anemia, unspecified iron deficiency anemia type; B12 deficiency Management per hematology.   Osteoporosis, unspecified osteoporosis type, unspecified pathological fracture presence Declined treatment. Will request records and plan  for next DEXA when time is appropriate. Continue calcium and vitamin D.  Tobacco abuse, started age 16, smokes 1.5  PPD Not interested in cessation at this time, continue efforts.  Other orders -     pantoprazole (PROTONIX) 40 MG tablet; Take 1 tablet (40 mg total) by mouth daily. -     cyanocobalamin (,VITAMIN B-12,) 1000 MCG/ML injection; Inject 1 mL (1,000 mcg total) into the muscle every 30 (thirty) days. Daily for 7 days, then weekly for 8 weeks. -     aspirin EC 81 MG tablet; Take 1 tablet (81 mg total) by mouth daily.    . Reviewed expectations re: course of current medical issues. . Discussed self-management of symptoms. . Outlined signs and symptoms indicating need for more acute intervention. . Patient verbalized understanding and all questions were answered. . See orders for this visit as documented in the electronic medical record. . Patient received an After-Visit Summary.   Inda Coke, PA-C

## 2018-11-02 ENCOUNTER — Ambulatory Visit: Payer: Medicare Other | Admitting: Physician Assistant

## 2018-11-08 ENCOUNTER — Ambulatory Visit: Payer: Medicare Other | Admitting: Physician Assistant

## 2018-11-12 ENCOUNTER — Ambulatory Visit (INDEPENDENT_AMBULATORY_CARE_PROVIDER_SITE_OTHER): Payer: Medicare Other

## 2018-11-12 ENCOUNTER — Other Ambulatory Visit: Payer: Self-pay

## 2018-11-12 VITALS — BP 118/70 | Temp 97.5°F | Ht 66.0 in | Wt 149.0 lb

## 2018-11-12 DIAGNOSIS — Z0000 Encounter for general adult medical examination without abnormal findings: Secondary | ICD-10-CM

## 2018-11-12 NOTE — Progress Notes (Addendum)
Subjective:   Jodi Myers is a 68 y.o. female who presents for Medicare Annual (Subsequent) preventive examination.  Review of Systems:   Cardiac Risk Factors include: smoking/ tobacco exposure     Objective:     Vitals: BP 118/70   Temp (!) 97.5 F (36.4 C)   Ht 5\' 6"  (1.676 m)   Wt 149 lb (67.6 kg)   BMI 24.05 kg/m   Body mass index is 24.05 kg/m.  Advanced Directives 11/12/2018 07/18/2018 07/17/2018 02/20/2018 01/23/2018  Does Patient Have a Medical Advance Directive? Yes Yes Yes Yes Yes  Type of Paramedic of [place];Living will [place];Living will [place];Living will [place];Living will [place];Living will  Does patient want to make changes to medical advance directive? No - Patient declined No - Patient declined - - No - Patient declined  Copy of [place] in Chart? No - copy requested No - copy requested No - copy requested - No - copy requested  Would patient like information on creating a medical advance directive? - No - Patient declined Yes (ED - Information included in AVS) - No - Patient declined    Tobacco Social History   Tobacco Use  Smoking Status Current Every Day Smoker  . Packs/day: 1.50  . Years: 49.00  . Pack years: 73.50  . Types: Cigarettes  Smokeless Tobacco Never Used     Ready to quit: No Counseling given: Yes   Clinical Intake:  Pre-visit preparation completed: Yes  Pain : No/denies pain  BMI - recorded: 24.05 Nutritional Status: BMI of 19-24  Normal Nutritional Risks: None Diabetes: No  How often do you need to have someone help you when you read instructions, pamphlets, or other written materials from your doctor or pharmacy?: 1 - Never  Interpreter Needed?: No  Information entered by :: Denman George LPN  Past Medical History:  Diagnosis Date  . Anemia 07/2018   bloood transfusion administered    . Coronary artery disease    Past Surgical History:  Procedure Laterality Date  . TONSILLECTOMY     Family History  Problem Relation Age of Onset  . Diabetes Mellitus II Mother   . Diabetes Mellitus I Sister   . Colon cancer Neg Hx    Social History   Socioeconomic History  . Marital status: Married    Spouse name: Not on file  . Number of children: Not on file  . Years of education: Not on file  . Highest education level: Not on file  Occupational History  . Not on file  Social Needs  . Financial resource strain: Not hard at all  . Food insecurity    Worry: Never true    Inability: Never true  . Transportation needs    Medical: No    Non-medical: No  Tobacco Use  . Smoking status: Current Every Day Smoker    Packs/day: 1.50    Years: 49.00    Pack years: 73.50    Types: Cigarettes  . Smokeless tobacco: Never Used  Substance and Sexual Activity  . Alcohol use: Yes    Alcohol/week: 7.0 standard drinks    Types: 7 Cans of beer per week    Comment: a beer a day  . Drug use: Never  . Sexual activity: Not on file  Lifestyle  . Physical activity    Days per week: Not on file    Minutes per session:  Not on file  . Stress: Not on file  Relationships  . Social Herbalist on phone: Not on file    Gets together: Not on file    Attends religious service: Not on file    Active member of club or organization: Not on file    Attends meetings of clubs or organizations: Not on file    Relationship status: Not on file  Other Topics Concern  . Not on file  Social History Narrative  . Not on file    Outpatient Encounter Medications as of 11/12/2018  Medication Sig  . acetaminophen (TYLENOL) 325 MG tablet Take 2 tablets (650 mg total) by mouth every 6 (six) hours as needed for mild pain (or Fever >/= 101).  Marland Kitchen aspirin EC 81 MG tablet Take 1 tablet (81 mg total) by mouth daily.  Marland Kitchen BESIVANCE 0.6 % SUSP Place 1 drop into the left eye See admin instructions.  Instill 1 drop into left eye 4 times a day for 2 days after each monthly eye injection  . CALCIUM PO Take 1 tablet by mouth daily.  . cyanocobalamin (,VITAMIN B-12,) 1000 MCG/ML injection Inject 1 mL (1,000 mcg total) into the muscle every 30 (thirty) days. Daily for 7 days, then weekly for 8 weeks.  . pantoprazole (PROTONIX) 40 MG tablet Take 1 tablet (40 mg total) by mouth daily.  Marland Kitchen VITAMIN D PO Take 1 tablet by mouth daily.   No facility-administered encounter medications on file as of 11/12/2018.     Activities of Daily Living In your present state of health, do you have any difficulty performing the following activities: 11/12/2018 07/18/2018  Hearing? N N  Vision? N N  Difficulty concentrating or making decisions? N N  Walking or climbing stairs? N N  Comment - -  Dressing or bathing? N N  Doing errands, shopping? N N  Preparing Food and eating ? N -  Using the Toilet? N -  In the past six months, have you accidently leaked urine? N -  Do you have problems with loss of bowel control? N -  Managing your Medications? N -  Managing your Finances? N -  Housekeeping or managing your Housekeeping? N -  Some recent data might be hidden    Patient Care Team: Inda Coke, [place] as PCP - General (Physician Assistant) Hayden Pedro, MD as Consulting Physician (Ophthalmology) Brunetta Genera, MD as Consulting Physician (Hematology)    Assessment:   This is a routine wellness examination for [place].  Exercise Activities and Dietary recommendations Current Exercise Habits: The patient does not participate in regular exercise at present   [place]  11/12/2018  Falls in the past year? 0  Number falls in past yr: 0  Injury with Fall? 0  Follow up Falls prevention discussed   Timed Get Up and Go performed: yes and within normal limits    Immunization History  Administered Date(s) Administered  . Influenza, High Dose Seasonal PF 01/19/2017  .  Influenza-Unspecified 01/28/2014    Qualifies for Shingles Vaccine? Patient given information on Shingrix; will check with pharmacy for coverage and Zostavax history    Screening Tests Health Maintenance  Topic Date Due  . Hepatitis C Screening  05-Mar-1951  . TETANUS/TDAP  05/19/1968  . MAMMOGRAM  05/20/1999  . COLONOSCOPY  05/20/1999  . DEXA SCAN  05/19/2014  . PNA vac Low Risk Adult (1 of 2 - PCV13) 05/19/2014  . INFLUENZA VACCINE  11/03/2018    Cancer Screenings: Lung: Low Dose CT Chest recommended if Age 47-80 years, 30 pack-year currently smoking OR have quit w/in 15years. Patient does qualify and is scheduled for yearly CT scan  Breast:  Up to date on Mammogram? Yes last 12/06/17; scheduled for yearly at [place] to date of Bone Density/Dexa? Yes completed 12/06/17 Colorectal: interested in Cologuard      Plan:   I have personally reviewed and addressed the Medicare Annual Wellness questionnaire and have noted the following in the patient's chart:  A. Medical and social history B. Use of alcohol, tobacco or illicit drugs  C. Current medications and supplements D. Functional ability and status E.  Nutritional status F.  Physical activity G. Advance directives H. List of other physicians I.  Hospitalizations, surgeries, and ER visits in previous 12 months J.  [place] such as hearing and vision if needed, cognitive and depression L. Referrals and appointments- Cologuard   In addition, I have reviewed and discussed with patient certain preventive protocols, quality metrics, and best practice recommendations. A written personalized care plan for preventive services as well as general preventive health recommendations were provided to patient.   Signed,  Denman George, LPN  Nurse Health Advisor   Nurse Notes:  Patient would like information on how she will schedule [place] ordered by Dr. Irene Limbo.  I will follow up with patient on process for this.     I have  reviewed documentation for AWV and Advance Care planning provided by [facility], I agree with documentation, I was immediately available for any questions. Inda Coke, [place]

## 2018-11-12 NOTE — Patient Instructions (Signed)
Jodi Myers , Thank you for taking time to come for your Medicare Wellness Visit. I appreciate your ongoing commitment to your health goals. Please review the following plan we discussed and let me know if I can assist you in the future.   Screening recommendations/referrals: Colorectal Screening: I will notify about Cologuard  Mammogram: completed 12/06/17 repeat September 2020 Bone Density: completed 12/06/17  Vision and Dental Exams: Recommended annual ophthalmology exams for early detection of glaucoma and other disorders of the eye Recommended annual dental exams for proper oral hygiene  Vaccinations: Influenza vaccine: recommended this fall  Pneumococcal vaccine: completed 08/01/16 Tdap vaccine: 06/29/15 Shingles vaccine: Please call your insurance company to determine your out of pocket expense for the Shingrix vaccine. You may receive this vaccine at your local pharmacy.  Advanced directives: Please bring a copy of your POA (Power of Attorney) and/or Living Will to your next appointment.  Goals: Recommend to drink at least 6-8 8oz glasses of water per day.  Next appointment: Please schedule your Annual Wellness Visit with your Nurse Health Advisor in one year.  Preventive Care 69 Years and Older, Female Preventive care refers to lifestyle choices and visits with your health care provider that can promote health and wellness. What does preventive care include?  A yearly physical exam. This is also called an annual well check.  Dental exams once or twice a year.  Routine eye exams. Ask your health care provider how often you should have your eyes checked.  Personal lifestyle choices, including:  Daily care of your teeth and gums.  Regular physical activity.  Eating a healthy diet.  Avoiding tobacco and drug use.  Limiting alcohol use.  Practicing safe sex.  Taking low-dose aspirin every day if recommended by your health care provider.  Taking vitamin and mineral  supplements as recommended by your health care provider. What happens during an annual well check? The services and screenings done by your health care provider during your annual well check will depend on your age, overall health, lifestyle risk factors, and family history of disease. Counseling  Your health care provider may ask you questions about your:  Alcohol use.  Tobacco use.  Drug use.  Emotional well-being.  Home and relationship well-being.  Sexual activity.  Eating habits.  History of falls.  Memory and ability to understand (cognition).  Work and work Statistician.  Reproductive health. Screening  You may have the following tests or measurements:  Height, weight, and BMI.  Blood pressure.  Lipid and cholesterol levels. These may be checked every 5 years, or more frequently if you are over 69 years old.  Skin check.  Lung cancer screening. You may have this screening every year starting at age 69 if you have a 30-pack-year history of smoking and currently smoke or have quit within the past 15 years.  Fecal occult blood test (FOBT) of the stool. You may have this test every year starting at age 69.  Flexible sigmoidoscopy or colonoscopy. You may have a sigmoidoscopy every 5 years or a colonoscopy every 10 years starting at age 69.  Hepatitis C blood test.  Hepatitis B blood test.  Sexually transmitted disease (STD) testing.  Diabetes screening. This is done by checking your blood sugar (glucose) after you have not eaten for a while (fasting). You may have this done every 1-3 years.  Bone density scan. This is done to screen for osteoporosis. You may have this done starting at age 69.  Mammogram. This may be  done every 1-2 years. Talk to your health care provider about how often you should have regular mammograms. Talk with your health care provider about your test results, treatment options, and if necessary, the need for more tests. Vaccines  Your  health care provider may recommend certain vaccines, such as:  Influenza vaccine. This is recommended every year.  Tetanus, diphtheria, and acellular pertussis (Tdap, Td) vaccine. You may need a Td booster every 10 years.  Zoster vaccine. You may need this after age 69.  Pneumococcal 13-valent conjugate (PCV13) vaccine. One dose is recommended after age 69.  Pneumococcal polysaccharide (PPSV23) vaccine. One dose is recommended after age 72. Talk to your health care provider about which screenings and vaccines you need and how often you need them. This information is not intended to replace advice given to you by your health care provider. Make sure you discuss any questions you have with your health care provider. Document Released: 04/17/2015 Document Revised: 12/09/2015 Document Reviewed: 01/20/2015 Elsevier Interactive Patient Education  2017 [place] Prevention in the Home Falls can cause injuries. They can happen to people of all ages. There are many things you can do to make your home safe and to help prevent falls. What can I do on the outside of my home?  Regularly fix the edges of walkways and driveways and fix any cracks.  Remove anything that might make you trip as you walk through a door, such as a raised step or threshold.  Trim any bushes or trees on the path to your home.  Use bright outdoor lighting.  Clear any walking paths of anything that might make someone trip, such as rocks or tools.  Regularly check to see if handrails are loose or broken. Make sure that both sides of any steps have handrails.  Any raised decks and porches should have guardrails on the edges.  Have any leaves, snow, or ice cleared regularly.  Use sand or salt on walking paths during winter.  Clean up any spills in your garage right away. This includes oil or grease spills. What can I do in the bathroom?  Use night lights.  Install grab bars by the toilet and in the tub and  shower. Do not use towel bars as grab bars.  Use non-skid mats or decals in the tub or shower.  If you need to sit down in the shower, use a plastic, non-slip stool.  Keep the floor dry. Clean up any water that spills on the floor as soon as it happens.  Remove soap buildup in the tub or shower regularly.  Attach bath mats securely with double-sided non-slip rug tape.  Do not have throw rugs and other things on the floor that can make you trip. What can I do in the bedroom?  Use night lights.  Make sure that you have a light by your bed that is easy to reach.  Do not use any sheets or blankets that are too big for your bed. They should not hang down onto the floor.  Have a firm chair that has side arms. You can use this for support while you get dressed.  Do not have throw rugs and other things on the floor that can make you trip. What can I do in the kitchen?  Clean up any spills right away.  Avoid walking on wet floors.  Keep items that you use a lot in easy-to-reach places.  If you need to reach something above you, use  a strong step stool that has a grab bar.  Keep electrical cords out of the way.  Do not use floor polish or wax that makes floors slippery. If you must use wax, use non-skid floor wax.  Do not have throw rugs and other things on the floor that can make you trip. What can I do with my stairs?  Do not leave any items on the stairs.  Make sure that there are handrails on both sides of the stairs and use them. Fix handrails that are broken or loose. Make sure that handrails are as long as the stairways.  Check any carpeting to make sure that it is firmly attached to the stairs. Fix any carpet that is loose or worn.  Avoid having throw rugs at the top or bottom of the stairs. If you do have throw rugs, attach them to the floor with carpet tape.  Make sure that you have a light switch at the top of the stairs and the bottom of the stairs. If you do not  have them, ask someone to add them for you. What else can I do to help prevent falls?  Wear shoes that:  Do not have high heels.  Have rubber bottoms.  Are comfortable and fit you well.  Are closed at the toe. Do not wear sandals.  If you use a stepladder:  Make sure that it is fully opened. Do not climb a closed stepladder.  Make sure that both sides of the stepladder are locked into place.  Ask someone to hold it for you, if possible.  Clearly mark and make sure that you can see:  Any grab bars or handrails.  First and last steps.  Where the edge of each step is.  Use tools that help you move around (mobility aids) if they are needed. These include:  Canes.  Walkers.  Scooters.  Crutches.  Turn on the lights when you go into a dark area. Replace any light bulbs as soon as they burn out.  Set up your furniture so you have a clear path. Avoid moving your furniture around.  If any of your floors are uneven, fix them.  If there are any pets around you, be aware of where they are.  Review your medicines with your doctor. Some medicines can make you feel dizzy. This can increase your chance of falling. Ask your doctor what other things that you can do to help prevent falls. This information is not intended to replace advice given to you by your health care provider. Make sure you discuss any questions you have with your health care provider. Document Released: 01/15/2009 Document Revised: 08/27/2015 Document Reviewed: 04/25/2014 Elsevier Interactive Patient Education  2017 [employer].

## 2018-11-19 ENCOUNTER — Encounter (INDEPENDENT_AMBULATORY_CARE_PROVIDER_SITE_OTHER): Payer: Medicare Other | Admitting: Ophthalmology

## 2018-11-19 ENCOUNTER — Other Ambulatory Visit: Payer: Self-pay

## 2018-11-19 DIAGNOSIS — Z23 Encounter for immunization: Secondary | ICD-10-CM | POA: Diagnosis not present

## 2018-11-19 DIAGNOSIS — H318 Other specified disorders of choroid: Secondary | ICD-10-CM

## 2018-11-19 DIAGNOSIS — H43813 Vitreous degeneration, bilateral: Secondary | ICD-10-CM | POA: Diagnosis not present

## 2018-11-19 DIAGNOSIS — B399 Histoplasmosis, unspecified: Secondary | ICD-10-CM

## 2018-11-30 ENCOUNTER — Encounter: Payer: Self-pay | Admitting: Physician Assistant

## 2018-12-06 ENCOUNTER — Telehealth: Payer: Self-pay | Admitting: *Deleted

## 2018-12-06 NOTE — Telephone Encounter (Signed)
Spoke to pt told her Jodi Myers was going through her records and found that she had a Prolia injection last year 12/2017 for Osteoporosis and we wanted to know if you would like to continue with injections? Pt said no, she was called by the office she went to when time for her next injection 06/2018 and was told at that time she would have to pay 1,300- 1,800 out of her own pocket. Pt said she can not afford it, so she did not want to continue Prolia injections. Pt said she only has straight Medicare no extra. Told pt okay that is fine and I understand and will let Jodi Myers know.

## 2018-12-12 DIAGNOSIS — Z1231 Encounter for screening mammogram for malignant neoplasm of breast: Secondary | ICD-10-CM | POA: Diagnosis not present

## 2018-12-12 LAB — HM MAMMOGRAPHY

## 2018-12-14 ENCOUNTER — Encounter: Payer: Self-pay | Admitting: Physician Assistant

## 2018-12-21 ENCOUNTER — Encounter: Payer: Self-pay | Admitting: Physician Assistant

## 2018-12-21 ENCOUNTER — Ambulatory Visit (INDEPENDENT_AMBULATORY_CARE_PROVIDER_SITE_OTHER): Payer: Medicare Other | Admitting: Physician Assistant

## 2018-12-21 ENCOUNTER — Other Ambulatory Visit: Payer: Self-pay

## 2018-12-21 ENCOUNTER — Ambulatory Visit (INDEPENDENT_AMBULATORY_CARE_PROVIDER_SITE_OTHER)
Admission: RE | Admit: 2018-12-21 | Discharge: 2018-12-21 | Disposition: A | Discharge status: Home / Self Care | Payer: Medicare Other | Source: Ambulatory Visit | Attending: Acute Care | Admitting: Acute Care

## 2018-12-21 ENCOUNTER — Telehealth: Payer: Self-pay | Admitting: Hematology

## 2018-12-21 VITALS — BP 106/70 | HR 94 | Temp 98.1°F | Ht 66.0 in | Wt 151.4 lb

## 2018-12-21 DIAGNOSIS — B079 Viral wart, unspecified: Secondary | ICD-10-CM | POA: Diagnosis not present

## 2018-12-21 DIAGNOSIS — Z122 Encounter for screening for malignant neoplasm of respiratory organs: Secondary | ICD-10-CM

## 2018-12-21 DIAGNOSIS — D229 Melanocytic nevi, unspecified: Secondary | ICD-10-CM

## 2018-12-21 DIAGNOSIS — F17210 Nicotine dependence, cigarettes, uncomplicated: Secondary | ICD-10-CM

## 2018-12-21 DIAGNOSIS — J432 Centrilobular emphysema: Secondary | ICD-10-CM | POA: Diagnosis not present

## 2018-12-21 LAB — ESTIMATED GFR: EGFR (Non-African Amer.): 70

## 2018-12-21 NOTE — Telephone Encounter (Signed)
Returned patient's phone call regarding rescheduling an appointment, left a voicemail. 

## 2018-12-21 NOTE — Progress Notes (Signed)
Jodi Myers is a 68 y.o. female here for a new problem.  I acted as a Education administrator for [employer], PA-C Anselmo Pickler, LPN  History of Present Illness:   Chief Complaint  Patient presents with  . Nevus removal    HPI   Nevus removal Pt has a nevus on her right anterior upper thigh that has been there for several years.  It has irregular borders and a darker center.  She denies any specific changes to it recently, but is interested in finding out if it is worrisome.  Wart removal She has a wart also on her left forearm near her wrist.  She would like this frozen today if possible.  She has had this done before.  Past Medical History:  Diagnosis Date  . Anemia 07/2018   bloood transfusion administered   . Coronary artery disease      Social History   Socioeconomic History  . Marital status: Married    Spouse name: Not on file  . Number of children: Not on file  . Years of education: Not on file  . Highest education level: Not on file  Occupational History  . Not on file  Social Needs  . Financial resource strain: Not hard at all  . Food insecurity    Worry: Never true    Inability: Never true  . Transportation needs    Medical: No    Non-medical: No  Tobacco Use  . Smoking status: Current Every Day Smoker    Packs/day: 1.50    Years: 49.00    Pack years: 73.50    Types: Cigarettes  . Smokeless tobacco: Never Used  Substance and Sexual Activity  . Alcohol use: Yes    Alcohol/week: 7.0 standard drinks    Types: 7 Cans of beer per week    Comment: a beer a day  . Drug use: Never  . Sexual activity: Not on file  Lifestyle  . Physical activity    Days per week: Not on file    Minutes per session: Not on file  . Stress: Not on file  Relationships  . Social Herbalist on phone: Not on file    Gets together: Not on file    Attends religious service: Not on file    Active member of club or organization: Not on file    Attends meetings of clubs or  organizations: Not on file    Relationship status: Not on file  . Intimate partner violence    Fear of current or ex partner: Not on file    Emotionally abused: Not on file    Physically abused: Not on file    Forced sexual activity: Not on file  Other Topics Concern  . Not on file  Social History Narrative  . Not on file    Past Surgical History:  Procedure Laterality Date  . TONSILLECTOMY      Family History  Problem Relation Age of Onset  . Diabetes Mellitus II Mother   . Rheum arthritis Mother   . CVA Mother   . Diabetes Mellitus I Sister   . Hashimoto's thyroiditis Sister   . Heart attack Father   . Colon cancer Neg Hx   . Liver disease Neg Hx     Allergies  Allergen Reactions  . Mango Flavor Other (See Comments)    Blisters  . Poison Ivy Extract Other (See Comments)    Blisters  . Sporanox [Itraconazole] Other (See Comments)  Blisters    Current Medications:   Current Outpatient Medications:  .  acetaminophen (TYLENOL) 325 MG tablet, Take 2 tablets (650 mg total) by mouth every 6 (six) hours as needed for mild pain (or Fever >/= 101)., Disp: 20 tablet, Rfl: 0 .  aspirin EC 81 MG tablet, Take 1 tablet (81 mg total) by mouth daily., Disp: 150 tablet, Rfl: 2 .  BESIVANCE 0.6 % SUSP, Place 1 drop into the left eye See admin instructions. Instill 1 drop into left eye 4 times a day for 2 days after each monthly eye injection, Disp: , Rfl:  .  CALCIUM PO, Take 1 tablet by mouth daily., Disp: , Rfl:  .  cyanocobalamin (,VITAMIN B-12,) 1000 MCG/ML injection, Inject 1 mL (1,000 mcg total) into the muscle every 30 (thirty) days. Daily for 7 days, then weekly for 8 weeks., Disp: 1 mL, Rfl: 0 .  pantoprazole (PROTONIX) 40 MG tablet, Take 1 tablet (40 mg total) by mouth daily., Disp: 90 tablet, Rfl: 1 .  VITAMIN D PO, Take 1 tablet by mouth daily., Disp: , Rfl:    Review of Systems:   ROS Negative unless otherwise specified per HPI.  Vitals:   Vitals:   12/21/18  1451  BP: 106/70  Pulse: 94  Temp: 98.1 F (36.7 C)  TempSrc: Temporal  SpO2: 94%  Weight: 151 lb 6.1 oz (68.7 kg)  Height: 5\' 6"  (1.676 m)     Body mass index is 24.43 kg/m.  Physical Exam:   Physical Exam Constitutional:      Appearance: She is well-developed.  HENT:     Head: Normocephalic and atraumatic.  Eyes:     Conjunctiva/sclera: Conjunctivae normal.  Neck:     Musculoskeletal: Normal range of motion and neck supple.  Pulmonary:     Effort: Pulmonary effort is normal.  Musculoskeletal: Normal range of motion.  Skin:    General: Skin is warm and dry.     Comments: 2 mm verrucous lesion to left anterior forearm  Brown nevus to right anterior thigh with small, darker brown raised-center and irregular borders, total lesion is approximately the size of a dime  Neurological:     Mental Status: She is alert and oriented to person, place, and time.  Psychiatric:        Behavior: Behavior normal.        Thought Content: Thought content normal.        Judgment: Judgment normal.    CRYOTHERAPY: Consent: Risks and benefits of therapy discussed with patient who voices understanding and agrees with planned care. No barriers to communication or understanding identified. After obtaining informed consent, the patient's identity, procedure, and site were verified during a pause prior to proceeding with the minor surgical procedure as per universal protocol recommendations. After appropriate cleansing, liquid nitrogen was applied to wart on L forearm.    SHAVE BIOPSY: Informed consent obtained.  Pt aware of risks not limited to but including infection,  bleeding, damage to near by organs. Prep: etoh/betadine Anesthesia: 1%lidocaine with epi, good effect Shave made with dermablade Minimal oozing, controlled with drysol Tolerated well   Assessment and Plan:   [name] was seen today for nevus removal.  Diagnoses and all orders for this visit:  Atypical nevus Nevus  removed without any issues.  Will send to pathology for further evaluation.  Worsening precautions and aftercare discussed. -     Dermatology pathology([place])  Viral warts, unspecified type Area frozen without any complications.  Cryo  aftercare provided in AVS.   . Reviewed expectations re: course of current medical issues. . Discussed self-management of symptoms. . Outlined signs and symptoms indicating need for more acute intervention. . Patient verbalized understanding and all questions were answered. . See orders for this visit as documented in the electronic medical record. . Patient received an After-Visit Summary.  CMA or LPN served as scribe during this visit. History, Physical, and Plan performed by medical provider. The above documentation has been reviewed and is accurate and complete.   Inda Coke, PA-C

## 2018-12-21 NOTE — Telephone Encounter (Signed)
Patient returned phone call regarding voicemail that was left and per patient's request appointment has been rescheduled to 10/13 due to patient being out of town on the original date.

## 2018-12-21 NOTE — Patient Instructions (Signed)
It was great to see you!  Cryosurgery post-operative instructions You have been treated with "cryosurgery." The treated area should be destroyed by this spray of liquid nitrogen. The care of the site should be as follows:  . Within 24 hours after freezing, a small blister may form at the site. This blister may be filled with either clear fluid or bloody fluid. If the blister is causing a great deal of pain, you should cleanse a needle or pin with rubbing alcohol and puncture the blister to drain it. If the blister is not painful, you should just it heal by itself.  . No special care other than washing the site with soap and water is necessary. The blister should resolve in approximately five to ten days leaving a small crust at the site. The crust should be allowed to come off on its own. Do not pick at or manipulate the crust.  . No bandage is typically needed after cryosurgery. However, if you have punctured a large, tender blister, application of Vaseline ointment and a band-aid may help with any discomfort.  . After four weeks if the treated lesion has not resolved, please make an appointment for follow-up evaluation.

## 2018-12-24 ENCOUNTER — Other Ambulatory Visit: Payer: Self-pay | Admitting: Physician Assistant

## 2018-12-24 DIAGNOSIS — D2371 Other benign neoplasm of skin of right lower limb, including hip: Secondary | ICD-10-CM | POA: Diagnosis not present

## 2018-12-27 ENCOUNTER — Telehealth: Payer: Self-pay | Admitting: Acute Care

## 2018-12-27 DIAGNOSIS — Z122 Encounter for screening for malignant neoplasm of respiratory organs: Secondary | ICD-10-CM

## 2018-12-27 DIAGNOSIS — F17210 Nicotine dependence, cigarettes, uncomplicated: Secondary | ICD-10-CM

## 2018-12-27 DIAGNOSIS — Z87891 Personal history of nicotine dependence: Secondary | ICD-10-CM

## 2018-12-27 NOTE — Telephone Encounter (Signed)
Pt informed of CT results per Sarah Groce, NP.  PT verbalized understanding.  Copy sent to PCP.  Order placed for 1 yr f/u CT.  

## 2019-01-01 ENCOUNTER — Ambulatory Visit (HOSPITAL_COMMUNITY)
Admission: RE | Admit: 2019-01-01 | Discharge: 2019-01-01 | Disposition: A | Discharge status: Home / Self Care | Payer: Medicare Other | Source: Ambulatory Visit | Attending: Hematology | Admitting: Hematology

## 2019-01-01 ENCOUNTER — Other Ambulatory Visit: Payer: Self-pay

## 2019-01-01 DIAGNOSIS — N281 Cyst of kidney, acquired: Secondary | ICD-10-CM | POA: Diagnosis not present

## 2019-01-08 ENCOUNTER — Encounter (INDEPENDENT_AMBULATORY_CARE_PROVIDER_SITE_OTHER): Payer: Medicare Other | Admitting: Ophthalmology

## 2019-01-08 DIAGNOSIS — H43813 Vitreous degeneration, bilateral: Secondary | ICD-10-CM | POA: Diagnosis not present

## 2019-01-08 DIAGNOSIS — B399 Histoplasmosis, unspecified: Secondary | ICD-10-CM | POA: Diagnosis not present

## 2019-01-08 DIAGNOSIS — H32 Chorioretinal disorders in diseases classified elsewhere: Secondary | ICD-10-CM | POA: Diagnosis not present

## 2019-01-08 DIAGNOSIS — H318 Other specified disorders of choroid: Secondary | ICD-10-CM

## 2019-01-14 NOTE — Progress Notes (Signed)
HEMATOLOGY/ONCOLOGY CLINIC NOTE  Date of Service: 01/15/2019  Patient Care Team: Inda Coke, [place] as PCP - General (Physician Assistant) Hayden Pedro, MD as Consulting Physician (Ophthalmology) Brunetta Genera, MD as Consulting Physician (Hematology)  CHIEF COMPLAINTS/PURPOSE OF CONSULTATION:  F/ for Pancytopenia, B12 def, iron def and DVT.  HISTORY OF PRESENTING ILLNESS:   Jodi Myers is a wonderful 68 y.o. female who has been referred to [place] by Dr. Cathlean Sauer for evaluation and management of Pancytopenia. She is accompanied today by her daughter. The pt reports that she is doing well overall.   The pt reports that she first began feeling unwell 10 days ago, beginning to feel fatigued and SOB. The pt notes that she was eating well until a few days ago, and denies any dietary restrictions. Denies thyroid problems. She recently began calcium and Vitamin D replacement for osteoporosis. She notes that her weight has been stable as have her bowel movements. She denies any fevers, chills, night sweats or unexpected weight loss  The pt notes that her ankle was swollen for the past few days, and after her VAS [place] she was placed on Xarelto.   The pt notes that her SOB has completely resolved. The pt notes that her fingers felt numb yesterday which is new for her and denies any neuropathy in her feet.   The pt had a VAS [place] on 01/23/18 which revealed Right: findings consistent with acute DVT involving the right posterior tibial vein  Most recent lab results (01/23/18) of CBC w/diff and BMP is as follows: all values are WNL except for RBC at 2.50, HGB at 8.6, HCT at 24.8, MCH at 34.4, RDW at 21.5, PLT at 39k, nRBC at 0.5%, Glucose at 120, Calcium at 8.6 01/23/18 Vitamin B12 was low at <50  On review of systems, pt reports resolving fatigue, resolved SOB, recently numb fingers, stable weight, and denies blood in the stools, blood in the urine, nose bleeds, gum bleeds, abnormal vaginal  bleeding, changes in vision, changes in memory, headaches, fevers, chills, night sweats, neuropathy in feet, balance issues, abdominal pains, and any other symptoms.   On PMHx the pt reports osteoporosis. On Social Hx the pt reports smoking 1-2 packs of cigarettes each day On Family Hx the pt reports mother with heart issues and rheumatic heart disease, sister with Hashimoto's thyroiditis and DM type I  Interval History:  Jodi Myers returns today for management and evaluation of her pernicious anemia and DVT. The patient's last visit with [place] was on 10/02/2018. The pt reports that she is doing well overall.  The pt reports that she has been feeling well and everything has been stable. Pt is continuing to take baby Asprin and Vitamin B12 injections. She has been giving herself the Vitamin B12 injections and hasn't had any issues doing so. She denies any blood in her stools. Pt reports that she is still smoking, 11/2 ppd. She is still hesitant to get a Colonoscopy but may consider based on labs today. Pt has had her annual flu shot. Pt is planning on taking a road trip in an RV to see her daughter for a few weeks.   Of note since the patient's last visit, pt has had US Renal ([id number]) completed on 01/01/2019 with results revealing "Bilateral renal cysts. The largest cyst is on the right measuring up to 9.8 cm with septations and no definitive solid components. This is similar appearance to the CT scan from January 04, 2018. Three cysts  in the left kidney."  Pt has had Mammography completed on 12/12/2018 with results revealing "There is no mammographic evidence of malignancy. Routine mammographic evaluation in 1 year is recommended (12/13/2019)."  Of note since the patient's last visit, pt has had CT Chest/Lung ([id number]) completed on 12/21/2018 with results revealing  "1. Lung-RADS 2, benign appearance or behavior. Continue annual screening with low-dose chest CT without contrast in 12 months. 2.  Aortic atherosclerosis (ICD10-170.0). Coronary artery calcification. 3.  Emphysema (ICD10-J43.9)."  Lab results today (01/15/19) of CBC w/diff and CMP is as follows: all values are WNL except for Hgb at 15.9, HCT at 47.2.  01/15/2019 TSH at 1.274 01/15/2019 Ferritin at 93 01/15/2019 Vitamin B12 is in 367 01/15/2019 Iron and TIBC is as follows: Iron at 48, TIBC at 301, Sat Ratios at 16, UIBC at 254  On review of systems, pt denies abdominal pain, bloody/black stools and any other symptoms.    MEDICAL HISTORY:  Past Medical History:  Diagnosis Date   Anemia 07/2018   bloood transfusion administered    Coronary artery disease     SURGICAL HISTORY: Past Surgical History:  Procedure Laterality Date   TONSILLECTOMY      SOCIAL HISTORY: Social History   Socioeconomic History   Marital status: Married    Spouse name: Not on file   Number of children: Not on file   Years of education: Not on file   Highest education level: Not on file  Occupational History   Not on file  Social Needs   Financial resource strain: Not hard at all   Food insecurity    Worry: Never true    Inability: Never true   Transportation needs    Medical: No    Non-medical: No  Tobacco Use   Smoking status: Current Every Day Smoker    Packs/day: 1.50    Years: 49.00    Pack years: 73.50    Types: Cigarettes   Smokeless tobacco: Never Used  Substance and Sexual Activity   Alcohol use: Yes    Alcohol/week: 7.0 standard drinks    Types: 7 Cans of beer per week    Comment: a beer a day   Drug use: Never   Sexual activity: Not on file  Lifestyle   Physical activity    Days per week: Not on file    Minutes per session: Not on file   Stress: Not on file  Relationships   Social connections    Talks on phone: Not on file    Gets together: Not on file    Attends religious service: Not on file    Active member of club or organization: Not on file    Attends meetings of clubs or  organizations: Not on file    Relationship status: Not on file   Intimate partner violence    Fear of current or ex partner: Not on file    Emotionally abused: Not on file    Physically abused: Not on file    Forced sexual activity: Not on file  Other Topics Concern   Not on file  Social History Narrative   Not on file    FAMILY HISTORY: Family History  Problem Relation Age of Onset   Diabetes Mellitus II Mother    Rheum arthritis Mother    CVA Mother    Diabetes Mellitus I Sister    Hashimoto's thyroiditis Sister    Heart attack Father    Colon cancer Neg Hx  Liver disease Neg Hx     ALLERGIES:  is allergic to mango flavor; poison ivy extract; and sporanox [itraconazole].  MEDICATIONS:  Current Outpatient Medications  Medication Sig Dispense Refill   acetaminophen (TYLENOL) 325 MG tablet Take 2 tablets (650 mg total) by mouth every 6 (six) hours as needed for mild pain (or Fever >/= 101). 20 tablet 0   aspirin EC 81 MG tablet Take 1 tablet (81 mg total) by mouth daily. 150 tablet 2   BESIVANCE 0.6 % SUSP Place 1 drop into the left eye See admin instructions. Instill 1 drop into left eye 4 times a day for 2 days after each monthly eye injection     CALCIUM PO Take 1 tablet by mouth daily.     cyanocobalamin (,VITAMIN B-12,) 1000 MCG/ML injection Inject 1 mL (1,000 mcg total) into the skin every 30 (thirty) days. 6 mL 1   pantoprazole (PROTONIX) 40 MG tablet Take 1 tablet (40 mg total) by mouth daily. 90 tablet 1   VITAMIN D PO Take 1 tablet by mouth daily.     No current facility-administered medications for this visit.     REVIEW OF SYSTEMS:    A 10+ POINT REVIEW OF SYSTEMS WAS OBTAINED including neurology, dermatology, psychiatry, cardiac, respiratory, lymph, extremities, GI, GU, Musculoskeletal, constitutional, breasts, reproductive, HEENT.  All pertinent positives are noted in the HPI.  All others are negative.    PHYSICAL EXAMINATION:  Vitals:     01/15/19 1416  BP: 133/72  Pulse: 83  Resp: 18  Temp: 98 F (36.7 C)  SpO2: 97%   Filed Weights   01/15/19 1416  Weight: 151 lb 8 oz (68.7 kg)   .Body mass index is 24.45 kg/m.  GENERAL:alert, in no acute distress and comfortable SKIN: no acute rashes, no significant lesions EYES: conjunctiva are pink and non-injected, sclera anicteric OROPHARYNX: MMM, no exudates, no oropharyngeal erythema or ulceration NECK: supple, no JVD LYMPH:  no palpable lymphadenopathy in the cervical, axillary or inguinal regions LUNGS: clear to auscultation b/l with normal respiratory effort HEART: regular rate & rhythm ABDOMEN:  normoactive bowel sounds , non tender, not distended. No palpable hepatosplenomegaly.  Extremity: no pedal edema PSYCH: alert & oriented x 3 with fluent speech NEURO: no focal motor/sensory deficits  LABORATORY DATA:  I have reviewed the data as listed  . CBC Latest Ref Rng & Units 01/15/2019 10/02/2018 07/31/2018  WBC 4.0 - 10.5 K/uL 6.4 6.5 5.8  Hemoglobin 12.0 - 15.0 g/dL 15.9(H) 15.5(H) 10.7(L)  Hematocrit 36.0 - 46.0 % 47.2(H) 47.6(H) 36.6  Platelets 150 - 400 K/uL 166 174 223    . CMP Latest Ref Rng & Units 10/02/2018 07/18/2018 07/17/2018  Glucose 70 - 99 mg/dL 106(H) 103(H) 127(H)  BUN 8 - 23 mg/dL 15 15 18   Creatinine 0.44 - 1.00 mg/dL 1.01(H) 0.78 0.89  Sodium 135 - 145 mmol/L 139 139 139  Potassium 3.5 - 5.1 mmol/L 3.9 3.4(L) 3.5  Chloride 98 - 111 mmol/L 103 108 106  CO2 22 - 32 mmol/L 25 22 26   Calcium 8.9 - 10.3 mg/dL 9.8 9.0 9.3  Total Protein 6.5 - 8.1 g/dL 7.8 - 6.3(L)  Total Bilirubin 0.3 - 1.2 mg/dL 0.3 - 0.3  Alkaline Phos 38 - 126 U/L 73 - 61  AST 15 - 41 U/L 16 - 17  ALT 0 - 44 U/L 12 - 10   Component     Latest Ref Rng & Units 01/23/2018 01/24/2018  Iron  28 - 170 ug/dL 299 (H)   TIBC     250 - 450 ug/dL 335   Saturation Ratios     10.4 - 31.8 % 89 (H)   UIBC     ug/dL 36   Retic Ct Pct     0.4 - 3.1 % 2.4   RBC.     3.87 -  5.11 MIL/uL 1.31 (L)   Retic Count, Absolute     19.0 - 186.0 K/uL 31.7   Immature Retic Fract     2.3 - 15.9 % 12.3   Vitamin B12     180 - 914 pg/mL <50 (L)   Folate     >5.9 ng/mL 7.5   Ferritin     11 - 307 ng/mL 66   Intrinsic Factor     0.0 - 1.1 AU/mL  18.2 (H)  Parietal Cell Antibody-IgG     0.0 - 20.0 Units  70.1 (H)   . Lab Results  Component Value Date   IRON 48 01/15/2019   TIBC 301 01/15/2019   IRONPCTSAT 16 (L) 01/15/2019   (Iron and TIBC)  Lab Results  Component Value Date   FERRITIN 93 01/15/2019   B12 --- 367  RADIOGRAPHIC STUDIES:  I have personally reviewed the radiological images as listed and agreed with the findings in the report. US Renal  Result Date: 01/01/2019 CLINICAL DATA:  Known renal cysts. EXAM: RENAL / URINARY TRACT ULTRASOUND COMPLETE COMPARISON:  CT scan January 24, 2018 FINDINGS: Right Kidney: Renal measurements: 9.8 x 4.7 x 3.5 cm = volume: 83.1 mL. The patient has known septated cyst in the upper right kidney is again identified. By my measurement, the maximum dimension on the previous study was 9.4 cm and there is not been significant change given difference in technique. Left Kidney: Renal measurements: 12.5 x 7.3 x 5.1 cm = volume: 241.8 mL. Three cysts are seen in the left kidney with the largest measuring 2.1 cm in the midpole. Bladder: Appears normal for degree of bladder distention. IMPRESSION: Bilateral renal cysts. The largest cyst is on the right measuring up to 9.8 cm with septations and no definitive solid components. This is similar appearance to the CT scan from January 04, 2018. Three cysts in the left kidney. Electronically Signed   By: Dorise Bullion III M.D   On: 01/01/2019 16:53   Ct Chest Lung Ca Screen Low Dose W/o Cm  Result Date: 12/21/2018 CLINICAL DATA:  Current smoker, 75 pack-year history. EXAM: CT CHEST WITHOUT CONTRAST LOW-DOSE FOR LUNG CANCER SCREENING TECHNIQUE: Multidetector CT imaging of the chest was  performed following the standard protocol without IV contrast. COMPARISON:  01/24/2018 and 11/20/2017. FINDINGS: Cardiovascular: Atherosclerotic calcification of the aorta, aortic valve and coronary arteries. Heart size normal. No pericardial effusion. Mediastinum/Nodes: 13 mm low-attenuation lesion in the left thyroid, as before. Calcified subcarinal lymph nodes. No pathologically enlarged mediastinal or axillary lymph nodes. Hilar regions are difficult to definitively evaluate without IV contrast but appear grossly unremarkable. Esophagus is unremarkable. Lungs/Pleura: Centrilobular and paraseptal emphysema. Smoking related respiratory bronchiolitis. Mild scattered pulmonary parenchymal scarring. Pulmonary nodules measure 4.8 mm or less in size. No worrisome pulmonary nodules. No pleural fluid. Airway is unremarkable. Upper Abdomen: Visualized portions of the liver, gallbladder, adrenal glands, left kidney, spleen, pancreas, stomach and bowel are grossly unremarkable. Musculoskeletal: Degenerative changes in the spine. No worrisome lytic or sclerotic lesions. IMPRESSION: 1. Lung-RADS 2, benign appearance or behavior. Continue annual screening with low-dose chest CT without contrast  in 12 months. 2. Aortic atherosclerosis (ICD10-170.0). Coronary artery calcification. 3.  Emphysema (ICD10-J43.9). Electronically Signed   By: Lorin Picket M.D.   On: 12/21/2018 13:07    ASSESSMENT & PLAN:   68 y.o. female with  1. Pancytopenia likely due to severe B12 deficiency Patient's labs upon initial presentation 01/23/18, Vitamin B12 low at <50. S/p blood transfusion the pt's HGB increased from 4.9 to 8.6.   Anemia a combination of pernicious anemia causing B12 and iron deficiency, and a concern of GI bleeding  2. Severe B12 deficiency due to pernicious anemia +ve parietal cell and Intrinsic factor antibodies. 01/24/18 anti-parietal antibody and intrinsic factor antibody are both elevated consistent with  pernicious anemia.  B12 upto 1500  3. Acute DVT rt posterior tibial veins. Likely triggering event - 2 packs/day of cigarette smoking.  4. Iron deficiency  01/23/18 VAS US revealed  Right: findings consistent with acute DVT involving the right posterior tibial vein   S/p 6 months of Xarelto  4. Septated cyst in right kidney As seen on 01/24/18 CT A/P Ordering repeat US for further characterization  PLAN: -Discussed pt labwork today, 01/15/19; all values are WNL except for Hgb at 15.9, HCT at 47.2.  -Discussed 01/15/2019 TSH at 1.274 -Discussed 01/15/2019 Ferritin at 93 -Discussed 01/15/2019 Vitamin B12 is in progress  -Discussed 01/15/2019 Iron and TIBC is as follows: Iron at 48, TIBC at 301, Sat Ratios at 16, UIBC at 254 -Discussed 01/01/2019 US Renal ([id number]) which revealed "Bilateral renal cysts. The largest cyst is on the right measuring up to 9.8 cm with septations and no definitive solid components. This is similar appearance to the CT scan from January 04, 2018. Three cysts in the left kidney." -Discussed 12/12/2018 Mammography which revealed "There is no mammographic evidence of malignancy. Routine mammographic evaluation in 1 year is recommended (12/13/2019)." -Discussed 12/21/2018 CT Chest/Lung ([id number]) which revealed "1. Lung-RADS 2, benign appearance or behavior. Continue annual screening with low-dose chest CT without contrast in 12 months. 2. Aortic atherosclerosis (ICD10-170.0). Coronary artery calcification. 3.  Emphysema (ICD10-J43.9)." -Continue Vitamin B complex 1 cap po daily  -Continue monthly [place] Vitamin B12 -Continue 81mg  Aspirin -Goal for Ferritin >100 -Continued to recommend Colonoscopy -Advised holding PO Iron to not confuse picture of dark stools and concern for blood loss, and replacing IV as needed -Recommended that pt return to care with Dr. Rosalie Gums in GI for colonoscopy and endoscopy, which she notes she is "still debating." She wants to follow  labs for now, and explicitly understands that this is not my recommendation.  -Advised seeking medical attention for blood in the stools or black, tarry stools  -Prioritize following with PCP Dr. Sabra Heck -Continued to counsel pt towards complete smoking cessation, and discussed that this remains an ongoing risk for future blood clots -Refill Vitamin B12 -Will see back in 6 months with labs  FOLLOW UP: RTC with Dr Irene Limbo with labs in 6 months  The total time spent in the appt was 15 minutes and more than 50% was on counseling and direct patient cares.  All of the patient's questions were answered with apparent satisfaction. The patient knows to call the clinic with any problems, questions or concerns.    Sullivan Lone MD [place] AAHIVMS [facility] Hematology/Oncology Physician [facility]  (Office):       [phone] (Work cell):  [phone] (Fax):           [phone]  01/15/2019 4:02 PM  I, Yevette Edwards, am acting  as a scribe for Dr. Sullivan Lone.   .I have reviewed the above documentation for accuracy and completeness, and I agree with the above. Brunetta Genera MD

## 2019-01-15 ENCOUNTER — Other Ambulatory Visit: Payer: Self-pay

## 2019-01-15 ENCOUNTER — Inpatient Hospital Stay: Payer: Medicare Other

## 2019-01-15 ENCOUNTER — Telehealth: Payer: Self-pay | Admitting: Hematology

## 2019-01-15 ENCOUNTER — Inpatient Hospital Stay: Payer: Medicare Other | Attending: Hematology | Admitting: Hematology

## 2019-01-15 VITALS — BP 133/72 | HR 83 | Temp 98.0°F | Resp 18 | Ht 66.0 in | Wt 151.5 lb

## 2019-01-15 DIAGNOSIS — E538 Deficiency of other specified B group vitamins: Secondary | ICD-10-CM

## 2019-01-15 DIAGNOSIS — Z7982 Long term (current) use of aspirin: Secondary | ICD-10-CM | POA: Diagnosis not present

## 2019-01-15 DIAGNOSIS — D61818 Other pancytopenia: Secondary | ICD-10-CM | POA: Insufficient documentation

## 2019-01-15 DIAGNOSIS — F17210 Nicotine dependence, cigarettes, uncomplicated: Secondary | ICD-10-CM | POA: Diagnosis not present

## 2019-01-15 DIAGNOSIS — Z8249 Family history of ischemic heart disease and other diseases of the circulatory system: Secondary | ICD-10-CM | POA: Insufficient documentation

## 2019-01-15 DIAGNOSIS — M818 Other osteoporosis without current pathological fracture: Secondary | ICD-10-CM | POA: Diagnosis not present

## 2019-01-15 DIAGNOSIS — E611 Iron deficiency: Secondary | ICD-10-CM | POA: Insufficient documentation

## 2019-01-15 DIAGNOSIS — D509 Iron deficiency anemia, unspecified: Secondary | ICD-10-CM | POA: Diagnosis not present

## 2019-01-15 DIAGNOSIS — D510 Vitamin B12 deficiency anemia due to intrinsic factor deficiency: Secondary | ICD-10-CM

## 2019-01-15 DIAGNOSIS — Z7901 Long term (current) use of anticoagulants: Secondary | ICD-10-CM | POA: Insufficient documentation

## 2019-01-15 DIAGNOSIS — I82441 Acute embolism and thrombosis of right tibial vein: Secondary | ICD-10-CM | POA: Diagnosis not present

## 2019-01-15 DIAGNOSIS — Z833 Family history of diabetes mellitus: Secondary | ICD-10-CM | POA: Insufficient documentation

## 2019-01-15 DIAGNOSIS — J439 Emphysema, unspecified: Secondary | ICD-10-CM | POA: Diagnosis not present

## 2019-01-15 LAB — VITAMIN B12: Vitamin B-12: 367 pg/mL (ref 180–914)

## 2019-01-15 LAB — TSH: TSH: 1.274 u[IU]/mL (ref 0.308–3.960)

## 2019-01-15 LAB — CBC WITH DIFFERENTIAL/PLATELET
Abs Immature Granulocytes: 0.02 10*3/uL (ref 0.00–0.07)
Basophils Absolute: 0.1 10*3/uL (ref 0.0–0.1)
Basophils Relative: 1 %
Eosinophils Absolute: 0.4 10*3/uL (ref 0.0–0.5)
Eosinophils Relative: 6 %
HCT: 47.2 % — ABNORMAL HIGH (ref 36.0–46.0)
Hemoglobin: 15.9 g/dL — ABNORMAL HIGH (ref 12.0–15.0)
Immature Granulocytes: 0 %
Lymphocytes Relative: 26 %
Lymphs Abs: 1.7 10*3/uL (ref 0.7–4.0)
MCH: 33.3 pg (ref 26.0–34.0)
MCHC: 33.7 g/dL (ref 30.0–36.0)
MCV: 99 fL (ref 80.0–100.0)
Monocytes Absolute: 0.6 10*3/uL (ref 0.1–1.0)
Monocytes Relative: 9 %
Neutro Abs: 3.7 10*3/uL (ref 1.7–7.7)
Neutrophils Relative %: 58 %
Platelets: 166 10*3/uL (ref 150–400)
RBC: 4.77 MIL/uL (ref 3.87–5.11)
RDW: 13.1 % (ref 11.5–15.5)
WBC: 6.4 10*3/uL (ref 4.0–10.5)
nRBC: 0 % (ref 0.0–0.2)

## 2019-01-15 LAB — IRON AND TIBC
Iron: 48 ug/dL (ref 41–142)
Saturation Ratios: 16 % — ABNORMAL LOW (ref 21–57)
TIBC: 301 ug/dL (ref 236–444)
UIBC: 254 ug/dL (ref 120–384)

## 2019-01-15 LAB — FERRITIN: Ferritin: 93 ng/mL (ref 11–307)

## 2019-01-15 MED ORDER — CYANOCOBALAMIN 1000 MCG/ML IJ SOLN: 1000.0000 ug | INTRAMUSCULAR | 1 refills | Status: DC

## 2019-01-15 NOTE — Telephone Encounter (Signed)
Scheduled appt per 10/13 los - gave patient AVS and calender per los.

## 2019-02-01 ENCOUNTER — Ambulatory Visit: Payer: Medicare Other | Admitting: Hematology

## 2019-02-26 ENCOUNTER — Encounter (INDEPENDENT_AMBULATORY_CARE_PROVIDER_SITE_OTHER): Payer: Medicare Other | Admitting: Ophthalmology

## 2019-02-26 DIAGNOSIS — H2513 Age-related nuclear cataract, bilateral: Secondary | ICD-10-CM | POA: Diagnosis not present

## 2019-02-26 DIAGNOSIS — H32 Chorioretinal disorders in diseases classified elsewhere: Secondary | ICD-10-CM

## 2019-02-26 DIAGNOSIS — B399 Histoplasmosis, unspecified: Secondary | ICD-10-CM | POA: Diagnosis not present

## 2019-02-26 DIAGNOSIS — H318 Other specified disorders of choroid: Secondary | ICD-10-CM | POA: Diagnosis not present

## 2019-02-26 DIAGNOSIS — H43813 Vitreous degeneration, bilateral: Secondary | ICD-10-CM | POA: Diagnosis not present

## 2019-04-16 ENCOUNTER — Encounter (INDEPENDENT_AMBULATORY_CARE_PROVIDER_SITE_OTHER): Payer: Medicare Other | Admitting: Ophthalmology

## 2019-04-16 ENCOUNTER — Other Ambulatory Visit: Payer: Self-pay

## 2019-04-16 DIAGNOSIS — B399 Histoplasmosis, unspecified: Secondary | ICD-10-CM | POA: Diagnosis not present

## 2019-04-16 DIAGNOSIS — H318 Other specified disorders of choroid: Secondary | ICD-10-CM

## 2019-04-16 DIAGNOSIS — H43813 Vitreous degeneration, bilateral: Secondary | ICD-10-CM | POA: Diagnosis not present

## 2019-04-24 NOTE — Progress Notes (Signed)
This encounter was created in error - please disregard.

## 2019-05-03 ENCOUNTER — Ambulatory Visit: Payer: Medicare Other

## 2019-05-11 ENCOUNTER — Ambulatory Visit: Payer: Medicare Other

## 2019-05-20 ENCOUNTER — Other Ambulatory Visit: Payer: Self-pay | Admitting: *Deleted

## 2019-05-20 ENCOUNTER — Ambulatory Visit: Payer: Medicare Other

## 2019-05-20 MED ORDER — PANTOPRAZOLE SODIUM 40 MG PO TBEC: 40.0000 mg | DELAYED_RELEASE_TABLET | Freq: Every day | ORAL | 1 refills | Status: DC

## 2019-06-11 ENCOUNTER — Encounter (INDEPENDENT_AMBULATORY_CARE_PROVIDER_SITE_OTHER): Payer: Medicare Other | Admitting: Ophthalmology

## 2019-06-11 DIAGNOSIS — H318 Other specified disorders of choroid: Secondary | ICD-10-CM

## 2019-06-11 DIAGNOSIS — H2513 Age-related nuclear cataract, bilateral: Secondary | ICD-10-CM | POA: Diagnosis not present

## 2019-06-11 DIAGNOSIS — H43813 Vitreous degeneration, bilateral: Secondary | ICD-10-CM

## 2019-06-11 DIAGNOSIS — B399 Histoplasmosis, unspecified: Secondary | ICD-10-CM

## 2019-07-11 ENCOUNTER — Encounter: Payer: Self-pay | Admitting: Family Medicine

## 2019-07-11 ENCOUNTER — Ambulatory Visit: Payer: Medicare Other | Admitting: Family Medicine

## 2019-07-11 ENCOUNTER — Ambulatory Visit (INDEPENDENT_AMBULATORY_CARE_PROVIDER_SITE_OTHER): Payer: Medicare Other | Admitting: Family Medicine

## 2019-07-11 ENCOUNTER — Other Ambulatory Visit: Payer: Self-pay

## 2019-07-11 VITALS — BP 132/70 | HR 78 | Temp 95.1°F | Resp 16 | Wt 167.4 lb

## 2019-07-11 DIAGNOSIS — B029 Zoster without complications: Secondary | ICD-10-CM

## 2019-07-11 DIAGNOSIS — M7022 Olecranon bursitis, left elbow: Secondary | ICD-10-CM | POA: Diagnosis not present

## 2019-07-11 NOTE — Patient Instructions (Signed)
Please follow up if symptoms do not improve or as needed.   Use capsaicin cream to the rash for discomfort as needed.  Ibuprofen 2-3 tabs up to 3x/ day with food can be used for discomfort and elbow swelling.    Shingles  Shingles, which is also known as herpes zoster, is an infection that causes a painful skin rash and fluid-filled blisters. It is caused by a virus. Shingles only develops in people who:  Have had chickenpox.  Have been given a medicine to protect against chickenpox (have been vaccinated). Shingles is rare in this group. What are the causes? Shingles is caused by varicella-zoster virus (VZV). This is the same virus that causes chickenpox. After a person is exposed to VZV, the virus stays in the body in an inactive (dormant) state. Shingles develops if the virus is reactivated. This can happen many years after the first (initial) exposure to VZV. It is not known what causes this virus to be reactivated. What increases the risk? People who have had chickenpox or received the chickenpox vaccine are at risk for shingles. Shingles infection is more common in people who:  Are older than age 50.  Have a weakened disease-fighting system (immune system), such as people with: ? HIV. ? AIDS. ? Cancer.  Are taking medicines that weaken the immune system, such as transplant medicines.  Are experiencing a lot of stress. What are the signs or symptoms? Early symptoms of this condition include itching, tingling, and pain in an area on your skin. Pain may be described as burning, stabbing, or throbbing. A few days or weeks after early symptoms start, a painful red rash appears. The rash is usually on one side of the body and has a band-like or belt-like pattern. The rash eventually turns into fluid-filled blisters that break open, change into scabs, and dry up in about 2-3 weeks. At any time during the infection, you may also develop:  A fever.  Chills.  A headache.  An upset  stomach. How is this diagnosed? This condition is diagnosed with a skin exam. Skin or fluid samples may be taken from the blisters before a diagnosis is made. These samples are examined under a microscope or sent to a lab for testing. How is this treated? The rash may last for several weeks. There is not a specific cure for this condition. Your health care provider will probably prescribe medicines to help you manage pain, recover more quickly, and avoid long-term problems. Medicines may include:  Antiviral drugs.  Anti-inflammatory drugs.  Pain medicines.  Anti-itching medicines (antihistamines). If the area involved is on your face, you may be referred to a specialist, such as an eye doctor (ophthalmologist) or an ear, nose, and throat (ENT) doctor (otolaryngologist) to help you avoid eye problems, chronic pain, or disability. Follow these instructions at home: Medicines  Take over-the-counter and prescription medicines only as told by your health care provider.  Apply an anti-itch cream or numbing cream to the affected area as told by your health care provider. Relieving itching and discomfort   Apply cold, wet cloths (cold compresses) to the area of the rash or blisters as told by your health care provider.  Cool baths can be soothing. Try adding baking soda or dry oatmeal to the water to reduce itching. Do not bathe in hot water. Blister and rash care  Keep your rash covered with a loose bandage (dressing). Wear loose-fitting clothing to help ease the pain of material rubbing against the rash.  Keep your rash and blisters clean by washing the area with mild soap and cool water as told by your health care provider.  Check your rash every day for signs of infection. Check for: ? More redness, swelling, or pain. ? Fluid or blood. ? Warmth. ? Pus or a bad smell.  Do not scratch your rash or pick at your blisters. To help avoid scratching: ? Keep your fingernails clean and cut  short. ? Wear gloves or mittens while you sleep, if scratching is a problem. General instructions  Rest as told by your health care provider.  Keep all follow-up visits as told by your health care provider. This is important.  Wash your hands often with soap and water. If soap and water are not available, use hand sanitizer. Doing this lowers your chance of getting a bacterial skin infection.  Before your blisters change into scabs, your shingles infection can cause chickenpox in people who have never had it or have never been vaccinated against it. To prevent this from happening, avoid contact with other people, especially: ? Babies. ? Pregnant women. ? Children who have eczema. ? Elderly people who have transplants. ? People who have chronic illnesses, such as cancer or AIDS. Contact a health care provider if:  Your pain is not relieved with prescribed medicines.  Your pain does not get better after the rash heals.  You have signs of infection in the rash area, such as: ? More redness, swelling, or pain around the rash. ? Fluid or blood coming from the rash. ? The rash area feeling warm to the touch. ? Pus or a bad smell coming from the rash. Get help right away if:  The rash is on your face or nose.  You have facial pain, pain around your eye area, or loss of feeling on one side of your face.  You have difficulty seeing.  You have ear pain or have ringing in your ear.  You have a loss of taste.  Your condition gets worse. Summary  Shingles, which is also known as herpes zoster, is an infection that causes a painful skin rash and fluid-filled blisters.  This condition is diagnosed with a skin exam. Skin or fluid samples may be taken from the blisters and examined before the diagnosis is made.  Keep your rash covered with a loose bandage (dressing). Wear loose-fitting clothing to help ease the pain of material rubbing against the rash.  Before your blisters change  into scabs, your shingles infection can cause chickenpox in people who have never had it or have never been vaccinated against it. This information is not intended to replace advice given to you by your health care provider. Make sure you discuss any questions you have with your health care provider. Document Revised: 07/13/2018 Document Reviewed: 11/23/2016 Elsevier Patient Education  [place].   Elbow Bursitis  Bursitis is swelling and pain at the tip of the elbow. This happens when fluid builds up in a sac under the skin (bursa). This may also be called olecranon bursitis. What are the causes? Elbow bursitis may be caused by:  Elbow injury, such as falling onto the elbow.  Leaning on hard surfaces for long periods of time.  Infection from an injury that breaks the skin near the elbow.  A bone growth (spur) that forms at the tip of the elbow.  A medical condition that causes inflammation, such as gout or rheumatoid arthritis. Sometimes the cause is not known. What are  the signs or symptoms? The first sign of elbow bursitis is usually swelling at the tip of the elbow. This can grow to be about the size of a golf ball. Swelling may start suddenly or develop gradually. Other symptoms may include:  Pain when bending or leaning on the elbow.  Not being able to move the elbow normally. If bursitis is caused by an infection, you may have:  Redness, warmth, and tenderness of the elbow.  Drainage of pus from the swollen area over the elbow, if the skin breaks open. How is this diagnosed? This condition may be diagnosed based on:  Your symptoms and medical history.  Any recent injuries you have had.  A physical exam.  X-rays to check for a bone spur or fracture.  Draining fluid from the bursa to test it for infection.  Blood tests to rule out gout or rheumatoid arthritis. How is this treated? Treatment for elbow bursitis depends on the cause. Treatment may  include:  Medicines. These may include: ? Over-the-counter medicines to relieve pain and inflammation. ? Antibiotic medicines. ? Injections of anti-inflammatory medicines (steroids).  Draining fluid from the bursa.  Wrapping your elbow with a bandage.  Wearing elbow pads. If these treatments do not help, you may need surgery to remove the bursa. Follow these instructions at home: Medicines  Take over-the-counter and prescription medicines only as told by your health care provider.  If you were prescribed an antibiotic medicine, take it as told by your health care provider. Do not stop taking the antibiotic even if you start to feel better. Managing pain, stiffness, and swelling   If directed, put ice on your elbow: ? Put ice in a plastic bag. ? Place a towel between your skin and the bag. ? Leave the ice on for 20 minutes, 2-3 times a day.  If your bursitis is caused by an injury, rest your elbow and wear your bandage as told by your health care provider.  Use elbow pads or elbow wraps to cushion your elbow as needed. General instructions  Avoid any activities that cause elbow pain. Ask your health care provider what activities are safe for you.  Keep all follow-up visits as told by your health care provider. This is important. Contact a health care provider if you have:  A fever.  Symptoms that do not get better with treatment.  Pain or swelling that: ? Gets worse. ? Goes away and then comes back.  Pus draining from your elbow. Get help right away if you have:  Trouble moving your arm, hand, or fingers. Summary  Elbow bursitis is inflammation of the fluid-filled sac (bursa) between the tip of your elbow bone (olecranon) and your skin.  Treatment for elbow bursitis depends on the cause. It may include medicines to relieve pain and inflammation, antibiotic medicines, and draining fluid from your elbow.  Contact a health care provider if your symptoms do not get  better with treatment, or if your symptoms go away and then come back. This information is not intended to replace advice given to you by your health care provider. Make sure you discuss any questions you have with your health care provider. Document Revised: 03/03/2017 Document Reviewed: 02/28/2017 Elsevier Patient Education  2020 [employer].

## 2019-07-11 NOTE — Progress Notes (Signed)
Subjective  CC:  Chief Complaint  Patient presents with  . Rash    vaginal rash started about a week ago - has spread to left side butt, thigh, and back  . Joint Swelling    left elbow   Same day acute visit; PCP not available. New pt to me. Chart reviewed.   HPI: Jodi Myers is a 69 y.o. female who presents to the office today to address the problems listed above in the chief complaint.  C/o rash to left side of perineum and now spreading upwards on buttocks; started about a week ago. Has had some blisters, now scabbed over and mild discomfort w/o itching, pain or drainage. Used otc antifungal cream w/o improvement. Has had shingrix vaccines last year. No systemic sxs.   Noted swollen nonpainful left elbow a few days ago. No known injury. No heat. No pain w/ movement. New problem. No h/o RA or elbow problems.    Assessment  1. Herpes zoster without complication   2. Olecranon bursitis of left elbow      Plan   shingles:  In S2 dermatome. Mild case fortunately and likely due to having had the vaccine. Supportive care with advil and capsaicin cream as needed. Education given. Should do well.  Elbow bursitis, idiopathic w/o infection. Supportive care and follow. Mild thickening. No sign of infection. Return if worsens.   Follow up: Return if symptoms worsen or fail to improve.  Visit date not found  No orders of the defined types were placed in this encounter.  No orders of the defined types were placed in this encounter.     I reviewed the patients updated PMH, FH, and SocHx.    Patient Active Problem List   Diagnosis Date Noted  . Osteoporosis, declines rx 10/25/2018  . Vitamin D deficiency 10/25/2018  . Tobacco abuse, started age 35, smokes 1.5 PPD 10/25/2018  . Iron deficiency anemia 07/31/2018  . Heme + stool 07/19/2018  . Pernicious anemia   . Pancytopenia ([place]) 01/23/2018  . DVT, lower extremity ([place]) 01/23/2018  . B12 deficiency 01/23/2018  . 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 acetaminophen (TYLENOL) 325 MG tablet Take 2 tablets (650 mg total) by mouth every 6 (six) hours as needed for mild pain (or Fever >/= 101).  Marland Kitchen aspirin EC 81 MG tablet Take 1 tablet (81 mg total) by mouth daily.  Marland Kitchen BESIVANCE 0.6 % SUSP Place 1 drop into the left eye See admin instructions. Instill 1 drop into left eye 4 times a day for 2 days after each monthly eye injection  . CALCIUM PO Take 1 tablet by mouth daily.  . cyanocobalamin (,VITAMIN B-12,) 1000 MCG/ML injection Inject 1 mL (1,000 mcg total) into the skin every 30 (thirty) days.  . pantoprazole (PROTONIX) 40 MG tablet Take 1 tablet (40 mg total) by mouth daily.  Marland Kitchen VITAMIN D PO Take 1 tablet by mouth daily.    Allergies: Patient is allergic to mango flavor; poison ivy extract; and sporanox [itraconazole]. Family History: Patient family history includes CVA in her mother; Diabetes Mellitus I in her sister; Diabetes Mellitus II in her mother; Hashimoto's thyroiditis in her sister; Heart attack in her father; Rheum arthritis in her mother. Social History:  Patient  reports that she has been smoking cigarettes. She has a 73.50 pack-year smoking history. She has never used smokeless tobacco. She reports current alcohol use of about 7.0 standard drinks of alcohol per  week. She reports that she does not use drugs.  Review of Systems: Constitutional: Negative for fever malaise or anorexia Cardiovascular: negative for chest pain Respiratory: negative for SOB or persistent cough Gastrointestinal: negative for abdominal pain  Objective  Vitals: BP 132/70   Pulse 78   Temp (!) 95.1 F (35.1 C) (Temporal)   Resp 16   Wt 167 lb 6.4 oz (75.9 kg)   SpO2 98%   BMI 27.02 kg/m  General: no acute distress , A&Ox3 Left elbow: FROM; non tender thickening over olecranon bursa w/o warmth; mild erythema present.  Skin:  Warm, left perineum and buttocks with dermatomal rash  with several scabbed lesions, several red papules early vesicles on erythematous macule.      Commons side effects, risks, benefits, and alternatives for medications and treatment plan prescribed today were discussed, and the patient expressed understanding of the given instructions. Patient is instructed to call or message via MyChart if he/she has any questions or concerns regarding our treatment plan. No barriers to understanding were identified. We discussed Red Flag symptoms and signs in detail. Patient expressed understanding regarding what to do in case of urgent or emergency type symptoms.   Medication list was reconciled, printed and provided to the patient in AVS. Patient instructions and summary information was reviewed with the patient as documented in the AVS. This note was prepared with assistance of Dragon voice recognition software. Occasional wrong-word or sound-a-like substitutions may have occurred due to the inherent limitations of voice recognition software  This visit occurred during the SARS-CoV-2 public health emergency.  Safety protocols were in place, including screening questions prior to the visit, additional usage of staff PPE, and extensive cleaning of exam room while observing appropriate contact time as indicated for disinfecting solutions.

## 2019-07-16 ENCOUNTER — Inpatient Hospital Stay: Payer: Medicare Other | Attending: Hematology | Admitting: Hematology

## 2019-07-16 ENCOUNTER — Telehealth: Payer: Self-pay | Admitting: Hematology

## 2019-07-16 ENCOUNTER — Other Ambulatory Visit: Payer: Self-pay

## 2019-07-16 ENCOUNTER — Inpatient Hospital Stay: Payer: Medicare Other

## 2019-07-16 VITALS — BP 144/57 | HR 71 | Temp 97.8°F | Resp 18 | Ht 66.0 in | Wt 167.4 lb

## 2019-07-16 DIAGNOSIS — D510 Vitamin B12 deficiency anemia due to intrinsic factor deficiency: Secondary | ICD-10-CM | POA: Diagnosis not present

## 2019-07-16 DIAGNOSIS — F17210 Nicotine dependence, cigarettes, uncomplicated: Secondary | ICD-10-CM | POA: Diagnosis not present

## 2019-07-16 DIAGNOSIS — Z86718 Personal history of other venous thrombosis and embolism: Secondary | ICD-10-CM | POA: Diagnosis not present

## 2019-07-16 DIAGNOSIS — Z7982 Long term (current) use of aspirin: Secondary | ICD-10-CM | POA: Diagnosis not present

## 2019-07-16 DIAGNOSIS — R2242 Localized swelling, mass and lump, left lower limb: Secondary | ICD-10-CM | POA: Insufficient documentation

## 2019-07-16 DIAGNOSIS — E611 Iron deficiency: Secondary | ICD-10-CM | POA: Insufficient documentation

## 2019-07-16 DIAGNOSIS — E538 Deficiency of other specified B group vitamins: Secondary | ICD-10-CM

## 2019-07-16 DIAGNOSIS — D509 Iron deficiency anemia, unspecified: Secondary | ICD-10-CM | POA: Diagnosis not present

## 2019-07-16 LAB — CBC WITH DIFFERENTIAL/PLATELET
Abs Immature Granulocytes: 0.01 10*3/uL (ref 0.00–0.07)
Basophils Absolute: 0.1 10*3/uL (ref 0.0–0.1)
Basophils Relative: 1 %
Eosinophils Absolute: 0.4 10*3/uL (ref 0.0–0.5)
Eosinophils Relative: 7 %
HCT: 46.5 % — ABNORMAL HIGH (ref 36.0–46.0)
Hemoglobin: 15.2 g/dL — ABNORMAL HIGH (ref 12.0–15.0)
Immature Granulocytes: 0 %
Lymphocytes Relative: 26 %
Lymphs Abs: 1.4 10*3/uL (ref 0.7–4.0)
MCH: 31.3 pg (ref 26.0–34.0)
MCHC: 32.7 g/dL (ref 30.0–36.0)
MCV: 95.7 fL (ref 80.0–100.0)
Monocytes Absolute: 0.5 10*3/uL (ref 0.1–1.0)
Monocytes Relative: 10 %
Neutro Abs: 3 10*3/uL (ref 1.7–7.7)
Neutrophils Relative %: 56 %
Platelets: 197 10*3/uL (ref 150–400)
RBC: 4.86 MIL/uL (ref 3.87–5.11)
RDW: 12.7 % (ref 11.5–15.5)
WBC: 5.3 10*3/uL (ref 4.0–10.5)
nRBC: 0 % (ref 0.0–0.2)

## 2019-07-16 LAB — FERRITIN: Ferritin: 39 ng/mL (ref 11–307)

## 2019-07-16 LAB — VITAMIN B12: Vitamin B-12: 435 pg/mL (ref 180–914)

## 2019-07-16 NOTE — Telephone Encounter (Signed)
Scheduled appt per 4/13 los.  Printed calendar and avs.

## 2019-07-16 NOTE — Progress Notes (Signed)
HEMATOLOGY/ONCOLOGY CLINIC NOTE  Date of Service: 07/16/2019  Patient Care Team: Inda Coke, [place] as PCP - General (Physician Assistant) Hayden Pedro, MD as Consulting Physician (Ophthalmology) Brunetta Genera, MD as Consulting Physician (Hematology)  CHIEF COMPLAINTS/PURPOSE OF CONSULTATION:  F/ for Pancytopenia, B12 def, iron def and DVT.  HISTORY OF PRESENTING ILLNESS:   Jodi Myers is a wonderful 69 y.o. female who has been referred to [place] by Dr. Cathlean Sauer for evaluation and management of Pancytopenia. She is accompanied today by her daughter. The pt reports that she is doing well overall.   The pt reports that she first began feeling unwell 10 days ago, beginning to feel fatigued and SOB. The pt notes that she was eating well until a few days ago, and denies any dietary restrictions. Denies thyroid problems. She recently began calcium and Vitamin D replacement for osteoporosis. She notes that her weight has been stable as have her bowel movements. She denies any fevers, chills, night sweats or unexpected weight loss  The pt notes that her ankle was swollen for the past few days, and after her VAS [place] she was placed on Xarelto.   The pt notes that her SOB has completely resolved. The pt notes that her fingers felt numb yesterday which is new for her and denies any neuropathy in her feet.   The pt had a VAS [place] on 01/23/18 which revealed Right: findings consistent with acute DVT involving the right posterior tibial vein  Most recent lab results (01/23/18) of CBC w/diff and BMP is as follows: all values are WNL except for RBC at 2.50, HGB at 8.6, HCT at 24.8, MCH at 34.4, RDW at 21.5, PLT at 39k, nRBC at 0.5%, Glucose at 120, Calcium at 8.6 01/23/18 Vitamin B12 was low at <50  On review of systems, pt reports resolving fatigue, resolved SOB, recently numb fingers, stable weight, and denies blood in the stools, blood in the urine, nose bleeds, gum bleeds, abnormal vaginal  bleeding, changes in vision, changes in memory, headaches, fevers, chills, night sweats, neuropathy in feet, balance issues, abdominal pains, and any other symptoms.   On PMHx the pt reports osteoporosis. On Social Hx the pt reports smoking 1-2 packs of cigarettes each day On Family Hx the pt reports mother with heart issues and rheumatic heart disease, sister with Hashimoto's thyroiditis and DM type I  Interval History: [name] returns today for management and evaluation of her pernicious anemia and DVT. The patient's last visit with [place] was on 01/15/2019. The pt reports that she is doing well overall.  The pt reports that she feels well and has had more energy lately. She has continued receiving her Vitamin B12 injections with her daughter's help. Pt takes a daily multivitamin, but often misses doses. Pt is still smoking 1 1/2 - 2 ppd and does not plan to stop. She is still unwilling to do a Colonoscopy or Upper Endoscopy.   She was diagnosed with Shingles last week, but was not given any antivirals. She first noticed a rash around the end of February. The rash was present on her left shoulder, but has since resolved in that area. It is currently present on her buttocks, and groin. Pt was given the Shingrix vaccine prior to contracting the virus.   Pt also has a small lump on her upper left thigh. It has been present for about 6 months and has not gotten larger.  Lab results today (07/16/19) of CBC w/diff is as  follows: all values are WNL except for Hgb at 15.2, HCT at 46.5.  07/16/2019 Ferritin at 39 07/16/2019 Vitamin B12 at 435  On review of systems, pt reports itching skin and denies bloody/black stools, unexpected weight loss, low appetite, fatigue, abdominal pain and any other symptoms.     MEDICAL HISTORY:  Past Medical History:  Diagnosis Date  . Anemia 07/2018   bloood transfusion administered   . Coronary artery disease     SURGICAL HISTORY: Past Surgical History:    Procedure Laterality Date  . TONSILLECTOMY      SOCIAL HISTORY: Social History   Socioeconomic History  . Marital status: Married    Spouse name: Not on file  . Number of children: Not on file  . Years of education: Not on file  . Highest education level: Not on file  Occupational History  . Not on file  Tobacco Use  . Smoking status: Current Every Day Smoker    Packs/day: 1.50    Years: 49.00    Pack years: 73.50    Types: Cigarettes  . Smokeless tobacco: Never Used  Substance and Sexual Activity  . Alcohol use: Yes    Alcohol/week: 7.0 standard drinks    Types: 7 Cans of beer per week    Comment: a beer a day  . Drug use: Never  . Sexual activity: Not on file  Other Topics Concern  . Not on file  Social History Narrative  . Not on file   Social Determinants of Health   Financial Resource Strain: Low Risk   . Difficulty of Paying Living Expenses: Not hard at all  Food Insecurity: No Food Insecurity  . Worried About Charity fundraiser in the Last Year: Never true  . Ran Out of Food in the Last Year: Never true  Transportation Needs: No Transportation Needs  . Lack of Transportation (Medical): No  . Lack of Transportation (Non-Medical): No  Physical Activity:   . Days of Exercise per Week:   . Minutes of Exercise per Session:   Stress:   . Feeling of Stress :   Social Connections:   . Frequency of Communication with Friends and Family:   . Frequency of Social Gatherings with Friends and Family:   . Attends Religious Services:   . Active Member of Clubs or Organizations:   . Attends Archivist Meetings:   Marland Kitchen Marital Status:   Intimate Partner Violence:   . Fear of Current or Ex-Partner:   . Emotionally Abused:   Marland Kitchen Physically Abused:   . Sexually Abused:     FAMILY HISTORY: Family History  Problem Relation Age of Onset  . Diabetes Mellitus II Mother   . Rheum arthritis Mother   . CVA Mother   . Diabetes Mellitus I Sister   . Hashimoto's  thyroiditis Sister   . Heart attack Father   . Colon cancer Neg Hx   . Liver disease Neg Hx     ALLERGIES:  is allergic to mango flavor; poison ivy extract; and sporanox [itraconazole].  MEDICATIONS:  Current Outpatient Medications  Medication Sig Dispense Refill  . acetaminophen (TYLENOL) 325 MG tablet Take 2 tablets (650 mg total) by mouth every 6 (six) hours as needed for mild pain (or Fever >/= 101). 20 tablet 0  . aspirin EC 81 MG tablet Take 1 tablet (81 mg total) by mouth daily. 150 tablet 2  . BESIVANCE 0.6 % SUSP Place 1 drop into the left eye See admin  instructions. Instill 1 drop into left eye 4 times a day for 2 days after each monthly eye injection    . CALCIUM PO Take 1 tablet by mouth daily.    . cyanocobalamin (,VITAMIN B-12,) 1000 MCG/ML injection Inject 1 mL (1,000 mcg total) into the skin every 30 (thirty) days. 6 mL 1  . pantoprazole (PROTONIX) 40 MG tablet Take 1 tablet (40 mg total) by mouth daily. 90 tablet 1  . VITAMIN D PO Take 1 tablet by mouth daily.     No current facility-administered medications for this visit.    REVIEW OF SYSTEMS:   A 10+ POINT REVIEW OF SYSTEMS WAS OBTAINED including neurology, dermatology, psychiatry, cardiac, respiratory, lymph, extremities, GI, GU, Musculoskeletal, constitutional, breasts, reproductive, HEENT.  All pertinent positives are noted in the HPI.  All others are negative.   PHYSICAL EXAMINATION:  Vitals:   07/16/19 1201  BP: (!) 144/57  Pulse: 71  Resp: 18  Temp: 97.8 F (36.6 C)  SpO2: 97%   Filed Weights   07/16/19 1201  Weight: 167 lb 6.4 oz (75.9 kg)   .Body mass index is 27.02 kg/m.   GENERAL:alert, in no acute distress and comfortable SKIN: dried and crusted shingles lesions over the left bottocks and left groin  EYES: conjunctiva are pink and non-injected, sclera anicteric OROPHARYNX: MMM, no exudates, no oropharyngeal erythema or ulceration NECK: supple, no JVD LYMPH:  no palpable lymphadenopathy in  the cervical, axillary or inguinal regions LUNGS: clear to auscultation b/l with normal respiratory effort HEART: regular rate & rhythm ABDOMEN:  normoactive bowel sounds , non tender, not distended. No palpable hepatosplenomegaly.  Extremity: no pedal edema PSYCH: alert & oriented x 3 with fluent speech NEURO: no focal motor/sensory deficits  LABORATORY DATA:  I have reviewed the data as listed  . CBC Latest Ref Rng & Units 07/16/2019 01/15/2019 10/02/2018  WBC 4.0 - 10.5 K/uL 5.3 6.4 6.5  Hemoglobin 12.0 - 15.0 g/dL 15.2(H) 15.9(H) 15.5(H)  Hematocrit 36.0 - 46.0 % 46.5(H) 47.2(H) 47.6(H)  Platelets 150 - 400 K/uL 197 166 174    . CMP Latest Ref Rng & Units 10/02/2018 07/18/2018 07/17/2018  Glucose 70 - 99 mg/dL 106(H) 103(H) 127(H)  BUN 8 - 23 mg/dL 15 15 18   Creatinine 0.44 - 1.00 mg/dL 1.01(H) 0.78 0.89  Sodium 135 - 145 mmol/L 139 139 139  Potassium 3.5 - 5.1 mmol/L 3.9 3.4(L) 3.5  Chloride 98 - 111 mmol/L 103 108 106  CO2 22 - 32 mmol/L 25 22 26   Calcium 8.9 - 10.3 mg/dL 9.8 9.0 9.3  Total Protein 6.5 - 8.1 g/dL 7.8 - 6.3(L)  Total Bilirubin 0.3 - 1.2 mg/dL 0.3 - 0.3  Alkaline Phos 38 - 126 U/L 73 - 61  AST 15 - 41 U/L 16 - 17  ALT 0 - 44 U/L 12 - 10   Component     Latest Ref Rng & Units 01/23/2018 01/24/2018  Iron     28 - 170 ug/dL 299 (H)   TIBC     250 - 450 ug/dL 335   Saturation Ratios     10.4 - 31.8 % 89 (H)   UIBC     ug/dL 36   Retic Ct Pct     0.4 - 3.1 % 2.4   RBC.     3.87 - 5.11 MIL/uL 1.31 (L)   Retic Count, Absolute     19.0 - 186.0 K/uL 31.7   Immature Retic Fract  2.3 - 15.9 % 12.3   Vitamin B12     180 - 914 pg/mL <50 (L)   Folate     >5.9 ng/mL 7.5   Ferritin     11 - 307 ng/mL 66   Intrinsic Factor     0.0 - 1.1 AU/mL  18.2 (H)  Parietal Cell Antibody-IgG     0.0 - 20.0 Units  70.1 (H)   . Lab Results  Component Value Date   IRON 48 01/15/2019   TIBC 301 01/15/2019   IRONPCTSAT 16 (L) 01/15/2019   (Iron and TIBC)  Lab  Results  Component Value Date   FERRITIN 39 07/16/2019   B12 --- 435  RADIOGRAPHIC STUDIES:  I have personally reviewed the radiological images as listed and agreed with the findings in the report. No results found.  ASSESSMENT & PLAN:   69 y.o. female with  1. Pancytopenia likely due to severe B12 deficiency Patient's labs upon initial presentation 01/23/18, Vitamin B12 low at <50. S/p blood transfusion the pt's HGB increased from 4.9 to 8.6.   Anemia a combination of pernicious anemia causing B12 and iron deficiency, and a concern of GI bleeding  2. Severe B12 deficiency due to pernicious anemia +ve parietal cell and Intrinsic factor antibodies. 01/24/18 anti-parietal antibody and intrinsic factor antibody are both elevated consistent with pernicious anemia.  B12 upto 1500  3. Acute DVT rt posterior tibial veins. Likely triggering event - 2 packs/day of cigarette smoking.  4. Iron deficiency  01/23/18 VAS US revealed  Right: findings consistent with acute DVT involving the right posterior tibial vein   S/p 6 months of Xarelto  4. Septated cyst in right kidney As seen on 01/24/18 CT A/P Ordering repeat US for further characterization  01/01/2019 US Renal ([id number]) which revealed "Bilateral renal cysts. The largest cyst is on the right measuring up to 9.8 cm with septations and no definitive solid components. This is similar appearance to the CT scan from January 04, 2018. Three cysts in the left kidney." 12/12/2018 Mammography which revealed "There is no mammographic evidence of malignancy. Routine mammographic evaluation in 1 year is recommended (12/13/2019)." 12/21/2018 CT Chest/Lung ([id number]) which revealed "1. Lung-RADS 2, benign appearance or behavior. Continue annual screening with low-dose chest CT without contrast in 12 months. 2. Aortic atherosclerosis (ICD10-170.0). Coronary artery calcification. 3.  Emphysema (ICD10-J43.9)."  PLAN: -Discussed pt labwork  today, 07/16/19; all values are WNL except for Hgb at 15.2, HCT at 46.5.  -Discussed 07/16/2019 Ferritin at 39 - Goal for Ferritin >100 -Discussed 07/16/2019 Vitamin B12 at 435 -Offered pt an anti-itch topical ointment for her Shingles sores - pt declined  -Advised pt that the lump on her upper left thigh is either a cyst or lipoma. Recommend pt watch and report if it begins to grow or change.  -Will continue to hold PO Iron to not confuse picture of dark stools and concern for blood loss. Will proceed with IV Iron replacement as needed.  -Counseled pt on the importance of smoking cessation and the increased risk of blood clots due to smoking. Pt prefers to continue smoking. -Continued to recommend Colonoscopy/Upper Endoscopy for GI bleeding evaluation -Continue monthly [place] Vitamin B12 injections  -Continue 81mg  Aspirin -Not unreasonable for pt to begin B-complex vitamin. Could also continue daily multivitamin.  -Will see back in 6 months with labs   FOLLOW UP: RTC with Dr Irene Limbo with labs in 6 months   The total time spent in the appt was 20  minutes and more than 50% was on counseling and direct patient cares.  All of the patient's questions were answered with apparent satisfaction. The patient knows to call the clinic with any problems, questions or concerns.    Sullivan Lone MD [place] AAHIVMS [facility] Hematology/Oncology Physician [facility]  (Office):       [phone] (Work cell):  [phone] (Fax):           [phone]  07/16/2019 3:57 PM  I, Yevette Edwards, am acting as a scribe for Dr. Sullivan Lone.   .I have reviewed the above documentation for accuracy and completeness, and I agree with the above. Brunetta Genera MD

## 2019-07-26 ENCOUNTER — Telehealth: Payer: Self-pay | Admitting: Hematology

## 2019-07-26 ENCOUNTER — Telehealth: Payer: Self-pay | Admitting: *Deleted

## 2019-07-26 NOTE — Telephone Encounter (Signed)
Notified of message below. OK to receive IV injectafer.  Message to scheduler

## 2019-07-26 NOTE — Telephone Encounter (Signed)
-----   Message from Rolland Bimler, RN sent at 07/26/2019  3:37 PM EDT -----  ----- Message ----- From: Brunetta Genera, MD Sent: 07/22/2019   9:11 AM EDT To: Rolland Bimler, RN  Plz let patient know her ferritin is @ 53 which is lower than the goal of >100. Given her pernicious anemia -would recommend 1 dose of IV Injectafer . Plz schedule if patient agreeable.

## 2019-07-26 NOTE — Telephone Encounter (Signed)
Scheduled appt per 4/23 sch message- pt aware of appt date and time

## 2019-08-01 ENCOUNTER — Inpatient Hospital Stay: Payer: Medicare Other

## 2019-08-01 ENCOUNTER — Other Ambulatory Visit: Payer: Self-pay

## 2019-08-01 VITALS — BP 119/60 | HR 77 | Temp 98.7°F | Resp 17

## 2019-08-01 DIAGNOSIS — E611 Iron deficiency: Secondary | ICD-10-CM | POA: Diagnosis not present

## 2019-08-01 DIAGNOSIS — F17210 Nicotine dependence, cigarettes, uncomplicated: Secondary | ICD-10-CM | POA: Diagnosis not present

## 2019-08-01 DIAGNOSIS — Z7982 Long term (current) use of aspirin: Secondary | ICD-10-CM | POA: Diagnosis not present

## 2019-08-01 DIAGNOSIS — D510 Vitamin B12 deficiency anemia due to intrinsic factor deficiency: Secondary | ICD-10-CM

## 2019-08-01 DIAGNOSIS — R2242 Localized swelling, mass and lump, left lower limb: Secondary | ICD-10-CM | POA: Diagnosis not present

## 2019-08-01 DIAGNOSIS — Z86718 Personal history of other venous thrombosis and embolism: Secondary | ICD-10-CM | POA: Diagnosis not present

## 2019-08-01 DIAGNOSIS — D509 Iron deficiency anemia, unspecified: Secondary | ICD-10-CM

## 2019-08-01 MED ORDER — ACETAMINOPHEN 325 MG PO TABS
650.0000 mg | ORAL_TABLET | Freq: Once | ORAL | Status: AC
  Administered 2019-08-01: 650 mg via ORAL

## 2019-08-01 MED ORDER — ACETAMINOPHEN 325 MG PO TABS
ORAL_TABLET | ORAL | Status: AC
  Filled 2019-08-01: qty 2

## 2019-08-01 MED ORDER — SODIUM CHLORIDE 0.9 % IV SOLN
Freq: Once | INTRAVENOUS | Status: AC
  Filled 2019-08-01: qty 250

## 2019-08-01 MED ORDER — LORATADINE 10 MG PO TABS
10.0000 mg | ORAL_TABLET | Freq: Once | ORAL | Status: AC
  Administered 2019-08-01: 10 mg via ORAL
  Filled 2019-08-01: qty 1

## 2019-08-01 MED ORDER — SODIUM CHLORIDE 0.9 % IV SOLN
750.0000 mg | Freq: Once | INTRAVENOUS | Status: AC
  Administered 2019-08-01: 750 mg via INTRAVENOUS
  Filled 2019-08-01: qty 15

## 2019-08-01 NOTE — Progress Notes (Signed)
Pt declined to stay for 30 minute post-observation  

## 2019-08-01 NOTE — Patient Instructions (Signed)
Ferric carboxymaltose injection What is this medicine? FERRIC CARBOXYMALTOSE (ferr-ik car-box-ee-mol-toes) is an iron complex. Iron is used to make healthy red blood cells, which carry oxygen and nutrients throughout the body. This medicine is used to treat anemia in people with chronic kidney disease or people who cannot take iron by mouth. This medicine may be used for other purposes; ask your health care provider or pharmacist if you have questions. COMMON BRAND NAME(S): Injectafer What should I tell my health care provider before I take this medicine? They need to know if you have any of these conditions:  high levels of iron in the blood  liver disease  an unusual or allergic reaction to iron, other medicines, foods, dyes, or preservatives  pregnant or trying to get pregnant  breast-feeding How should I use this medicine? This medicine is for infusion into a vein. It is given by a health care professional in a hospital or clinic setting. Talk to your pediatrician regarding the use of this medicine in children. Special care may be needed. Overdosage: If you think you have taken too much of this medicine contact a poison control center or emergency room at once. NOTE: This medicine is only for you. Do not share this medicine with others. What if I miss a dose? It is important not to miss your dose. Call your doctor or health care professional if you are unable to keep an appointment. What may interact with this medicine? Do not take this medicine with any of the following medications:  deferoxamine  dimercaprol  other iron products This list may not describe all possible interactions. Give your health care provider a list of all the medicines, herbs, non-prescription drugs, or dietary supplements you use. Also tell them if you smoke, drink alcohol, or use illegal drugs. Some items may interact with your medicine. What should I watch for while using this medicine? Visit your  doctor or health care professional regularly. Tell your doctor if your symptoms do not start to get better or if they get worse. You may need blood work done while you are taking this medicine. You may need to follow a special diet. Talk to your doctor. Foods that contain iron include: whole grains/cereals, dried fruits, beans, or peas, leafy green vegetables, and organ meats (liver, kidney). What side effects may I notice from receiving this medicine? Side effects that you should report to your doctor or health care professional as soon as possible:  allergic reactions like skin rash, itching or hives, swelling of the face, lips, or tongue  dizziness  facial flushing Side effects that usually do not require medical attention (report to your doctor or health care professional if they continue or are bothersome):  changes in taste  constipation  headache  nausea, vomiting  pain, redness, or irritation at site where injected This list may not describe all possible side effects. Call your doctor for medical advice about side effects. You may report side effects to FDA at 1-800-FDA-1088. Where should I keep my medicine? This drug is given in a hospital or clinic and will not be stored at home. NOTE: This sheet is a summary. It may not cover all possible information. If you have questions about this medicine, talk to your doctor, pharmacist, or health care provider.  2020 Elsevier/Gold Standard (2016-05-05 09:40:29)  Coronavirus (COVID-19) Are you at risk?  Are you at risk for the Coronavirus (COVID-19)?  To be considered HIGH RISK for Coronavirus (COVID-19), you have to meet the following   criteria:  . Traveled to [place], [place], [place], [place] or [place]; or in the [place] to [place], [place], [place], or [place]; and have fever, cough, and shortness of breath within the last 2 weeks of travel OR . Been in close contact with a person diagnosed with COVID-19 within the  last 2 weeks and have fever, cough, and shortness of breath . IF YOU DO NOT MEET THESE CRITERIA, YOU ARE CONSIDERED LOW RISK FOR COVID-19.  What to do if you are HIGH RISK for COVID-19?  . If you are having a medical emergency, call 911. . Seek medical care right away. Before you go to a doctor's office, urgent care or emergency department, call ahead and tell them about your recent travel, contact with someone diagnosed with COVID-19, and your symptoms. You should receive instructions from your physician's office regarding next steps of care.  . When you arrive at healthcare provider, tell the healthcare staff immediately you have returned from visiting [place], [place], [place], [place] or [place]; or traveled in the [place] to [place], [place], [place], or [place]; in the last two weeks or you have been in close contact with a person diagnosed with COVID-19 in the last 2 weeks.   . Tell the health care staff about your symptoms: fever, cough and shortness of breath. . After you have been seen by a medical provider, you will be either: o Tested for (COVID-19) and discharged home on quarantine except to seek medical care if symptoms worsen, and asked to  - Stay home and avoid contact with others until you get your results (4-5 days)  - Avoid travel on public transportation if possible (such as bus, train, or airplane) or o Sent to the Emergency Department by EMS for evaluation, COVID-19 testing, and possible admission depending on your condition and test results.  What to do if you are LOW RISK for COVID-19?  Reduce your risk of any infection by using the same precautions used for avoiding the common cold or flu:  . Wash your hands often with soap and warm water for at least 20 seconds.  If soap and water are not readily available, use an alcohol-based hand sanitizer with at least 60% alcohol.  . If coughing or sneezing, cover your mouth and nose by coughing or sneezing into the elbow  areas of your shirt or coat, into a tissue or into your sleeve (not your hands). . Avoid shaking hands with others and consider head nods or verbal greetings only. . Avoid touching your eyes, nose, or mouth with unwashed hands.  . Avoid close contact with people who are sick. . Avoid places or events with large numbers of people in one location, like concerts or sporting events. . Carefully consider travel plans you have or are making. . If you are planning any travel outside or inside the [place], visit the CDC's Travelers' Health webpage for the latest health notices. . If you have some symptoms but not all symptoms, continue to monitor at home and seek medical attention if your symptoms worsen. . If you are having a medical emergency, call 911.   ADDITIONAL HEALTHCARE OPTIONS FOR PATIENTS  [facility] / e-Visit: [email or web address]         [facility]: [phone]  [facility]: [phone]                   [facility]: [phone]   

## 2019-08-06 ENCOUNTER — Encounter (INDEPENDENT_AMBULATORY_CARE_PROVIDER_SITE_OTHER): Payer: Medicare Other | Admitting: Ophthalmology

## 2019-08-14 ENCOUNTER — Encounter (INDEPENDENT_AMBULATORY_CARE_PROVIDER_SITE_OTHER): Payer: Medicare Other | Admitting: Ophthalmology

## 2019-08-14 ENCOUNTER — Other Ambulatory Visit: Payer: Self-pay

## 2019-08-14 DIAGNOSIS — H43813 Vitreous degeneration, bilateral: Secondary | ICD-10-CM | POA: Diagnosis not present

## 2019-08-14 DIAGNOSIS — H2513 Age-related nuclear cataract, bilateral: Secondary | ICD-10-CM | POA: Diagnosis not present

## 2019-08-14 DIAGNOSIS — B399 Histoplasmosis, unspecified: Secondary | ICD-10-CM

## 2019-08-14 DIAGNOSIS — H318 Other specified disorders of choroid: Secondary | ICD-10-CM | POA: Diagnosis not present

## 2019-10-09 ENCOUNTER — Other Ambulatory Visit: Payer: Self-pay

## 2019-10-09 ENCOUNTER — Encounter (INDEPENDENT_AMBULATORY_CARE_PROVIDER_SITE_OTHER): Payer: Medicare Other | Admitting: Ophthalmology

## 2019-10-09 DIAGNOSIS — H318 Other specified disorders of choroid: Secondary | ICD-10-CM

## 2019-10-09 DIAGNOSIS — B399 Histoplasmosis, unspecified: Secondary | ICD-10-CM | POA: Diagnosis not present

## 2019-11-10 ENCOUNTER — Other Ambulatory Visit: Payer: Self-pay | Admitting: Physician Assistant

## 2019-11-13 ENCOUNTER — Other Ambulatory Visit: Payer: Self-pay | Admitting: Physician Assistant

## 2019-11-14 ENCOUNTER — Ambulatory Visit: Payer: Medicare Other

## 2019-11-21 IMAGING — CT CT CHEST LUNG CANCER SCREENING LOW DOSE W/O CM
1 of 2 series · 10 of 20 positions shown, 13 images · non-contrast
Comparison: 08/24/2016 screening chest CT.

CLINICAL DATA: 67-year-old asymptomatic female current smoker with
74 pack-year smoking history.

EXAM:
CT CHEST WITHOUT CONTRAST LOW-DOSE FOR LUNG CANCER SCREENING
TECHNIQUE: Multidetector CT imaging of the chest was performed following the
standard protocol without IV contrast.

[ct lung segmentation data · axial · 0.72mm/px · z∈[-330,-330]mm · 10 of 319 frames shown]
[frame 1/319  mediastinal]
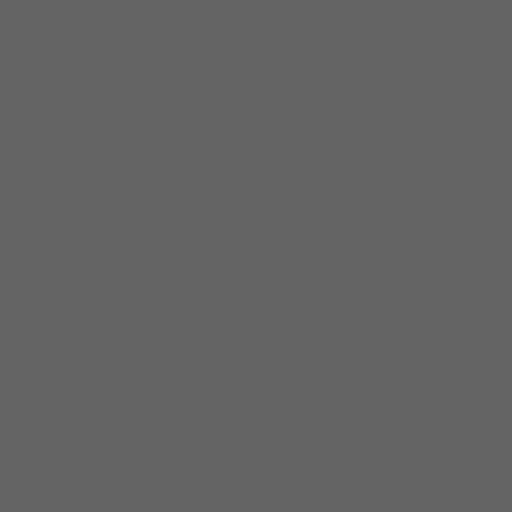
[frame 1/319  lung]
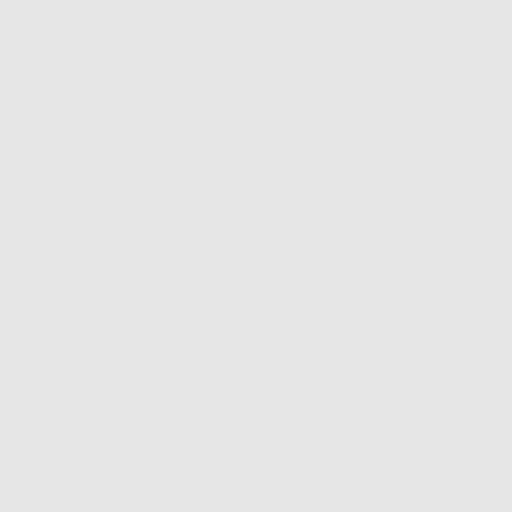
[frame 36/319  lung]
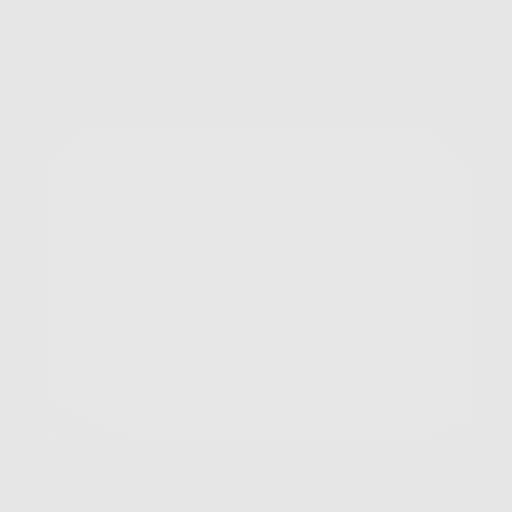
[frame 71/319  lung]
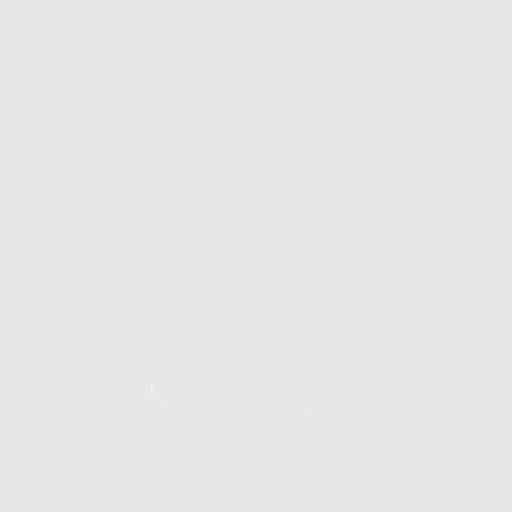
[frame 107/319  lung]
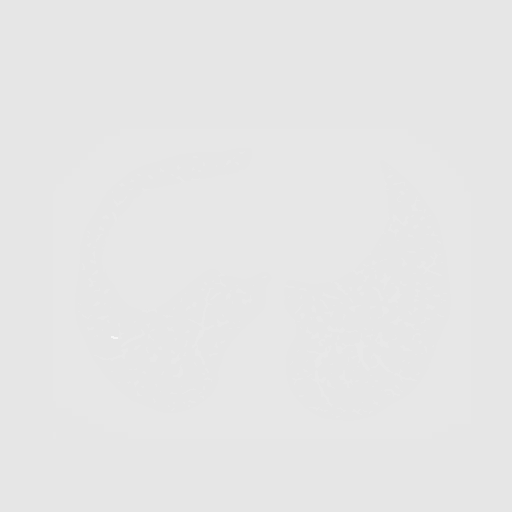
[frame 142/319  mediastinal]
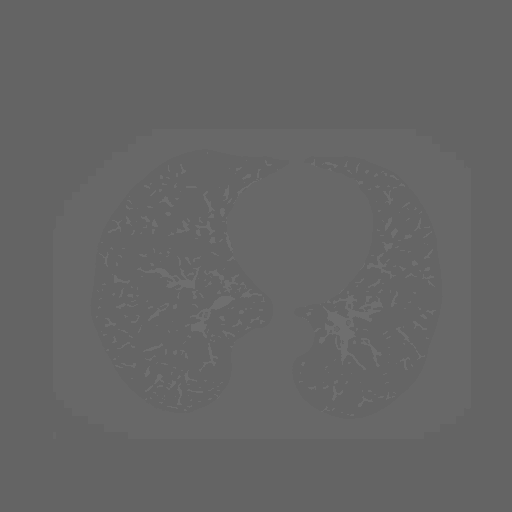
[frame 142/319  lung]
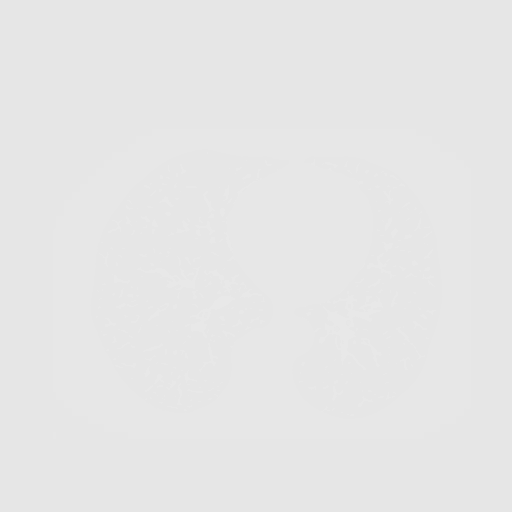
[frame 177/319  lung]
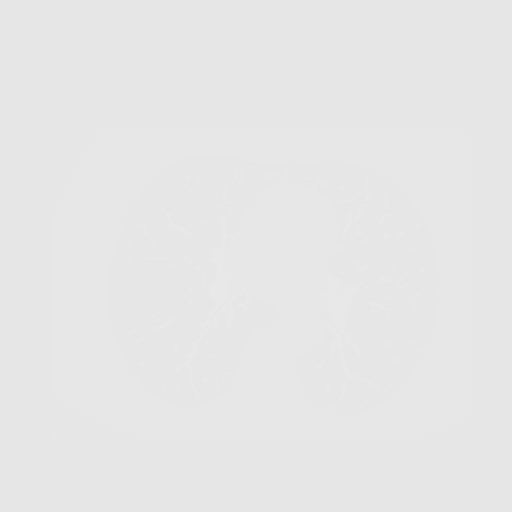
[frame 213/319  lung]
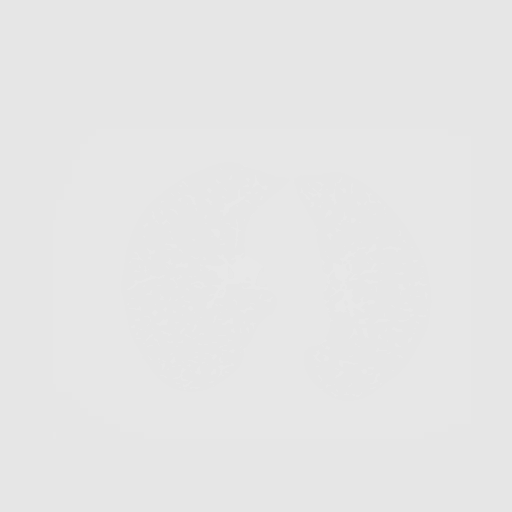
[frame 248/319  lung]
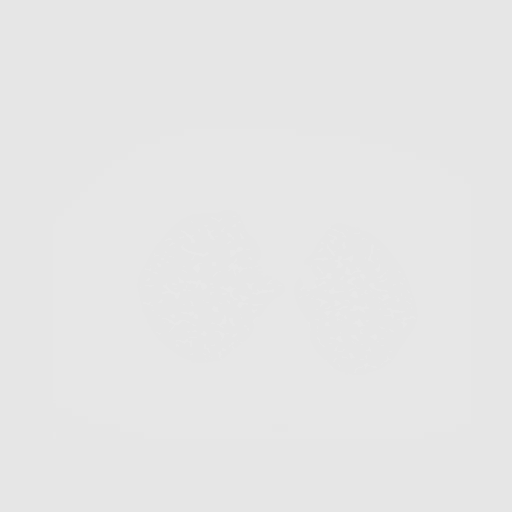
[frame 283/319  mediastinal]
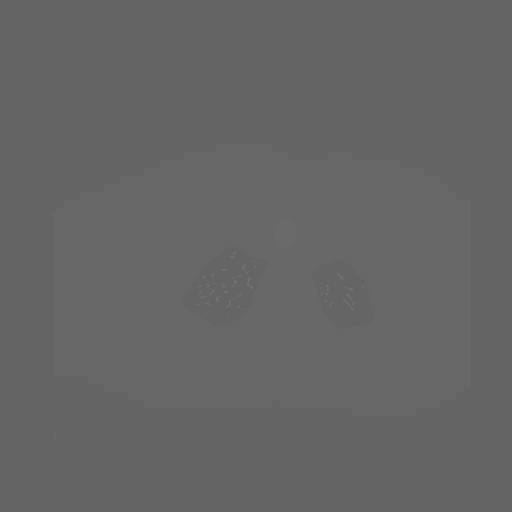
[frame 283/319  lung]
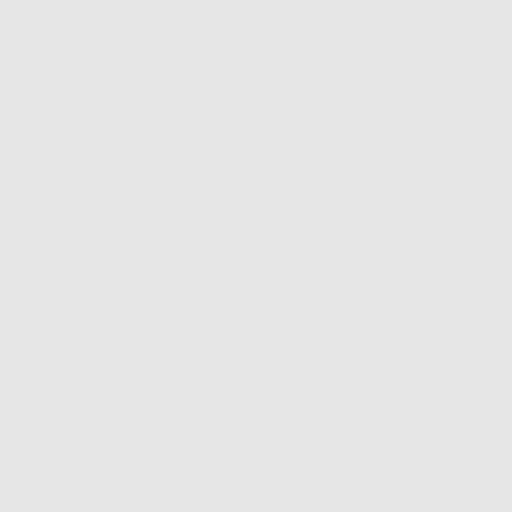
[frame 319/319  lung]
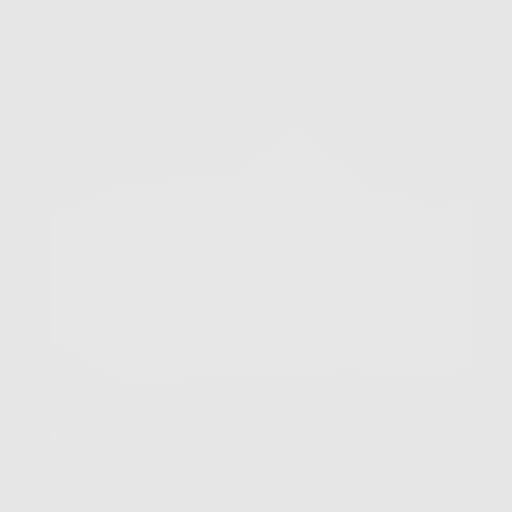

[10 of 20 positions shown; findings below may reference images not displayed]

FINDINGS: Cardiovascular: Normal heart size. No significant pericardial
effusion/thickening. Three-vessel coronary atherosclerosis.
Atherosclerotic nonaneurysmal thoracic aorta. Normal caliber
pulmonary arteries.

Mediastinum/Nodes: Dominant stable hypodense 1.2 cm left thyroid
lobe nodule. Unremarkable esophagus. No pathologically enlarged
axillary, mediastinal or hilar lymph nodes, noting limited
sensitivity for the detection of hilar adenopathy on this
noncontrast study. Stable coarsely calcified subcarinal nodes
compatible with prior granulomatous disease.

Lungs/Pleura: No pneumothorax. No pleural effusion. Mild paraseptal
emphysema with mild diffuse bronchial wall thickening. No acute
consolidative airspace disease or lung masses. No significant growth
of any of the previously visualized scattered pulmonary nodules. No
new significant pulmonary nodules.

Upper abdomen: Stable granulomatous liver and splenic
calcifications.

Musculoskeletal: No aggressive appearing focal osseous lesions. Mild
thoracic spondylosis.
IMPRESSION: 1. Lung-RADS 2, benign appearance or behavior. Continue annual
screening with low-dose chest CT without contrast in 12 months.
2. Three-vessel coronary atherosclerosis.

Aortic Atherosclerosis (RK3EC-I8A.A) and Emphysema ([id number]-6GI.Q).

## 2019-12-04 ENCOUNTER — Encounter (INDEPENDENT_AMBULATORY_CARE_PROVIDER_SITE_OTHER): Payer: Medicare Other | Admitting: Ophthalmology

## 2019-12-04 ENCOUNTER — Other Ambulatory Visit: Payer: Self-pay

## 2019-12-04 DIAGNOSIS — H43813 Vitreous degeneration, bilateral: Secondary | ICD-10-CM

## 2019-12-04 DIAGNOSIS — H318 Other specified disorders of choroid: Secondary | ICD-10-CM | POA: Diagnosis not present

## 2019-12-18 DIAGNOSIS — Z1231 Encounter for screening mammogram for malignant neoplasm of breast: Secondary | ICD-10-CM | POA: Diagnosis not present

## 2019-12-18 LAB — HM MAMMOGRAPHY

## 2019-12-20 ENCOUNTER — Encounter: Payer: Self-pay | Admitting: Physician Assistant

## 2019-12-21 DIAGNOSIS — Z23 Encounter for immunization: Secondary | ICD-10-CM | POA: Diagnosis not present

## 2020-01-02 DIAGNOSIS — Z23 Encounter for immunization: Secondary | ICD-10-CM | POA: Diagnosis not present

## 2020-01-09 ENCOUNTER — Ambulatory Visit
Admission: RE | Admit: 2020-01-09 | Discharge: 2020-01-09 | Disposition: A | Discharge status: Home / Self Care | Payer: Medicare Other | Source: Ambulatory Visit | Attending: Acute Care | Admitting: Acute Care

## 2020-01-09 ENCOUNTER — Other Ambulatory Visit: Payer: Self-pay

## 2020-01-09 DIAGNOSIS — Z87891 Personal history of nicotine dependence: Secondary | ICD-10-CM

## 2020-01-09 DIAGNOSIS — J432 Centrilobular emphysema: Secondary | ICD-10-CM | POA: Diagnosis not present

## 2020-01-09 DIAGNOSIS — Z122 Encounter for screening for malignant neoplasm of respiratory organs: Secondary | ICD-10-CM

## 2020-01-09 DIAGNOSIS — I2510 Atherosclerotic heart disease of native coronary artery without angina pectoris: Secondary | ICD-10-CM | POA: Diagnosis not present

## 2020-01-09 DIAGNOSIS — I700 Atherosclerosis of aorta: Secondary | ICD-10-CM | POA: Diagnosis not present

## 2020-01-09 DIAGNOSIS — F17210 Nicotine dependence, cigarettes, uncomplicated: Secondary | ICD-10-CM

## 2020-01-15 ENCOUNTER — Telehealth: Payer: Self-pay | Admitting: Hematology

## 2020-01-15 ENCOUNTER — Inpatient Hospital Stay: Payer: Medicare Other

## 2020-01-15 ENCOUNTER — Inpatient Hospital Stay: Payer: Medicare Other | Attending: Hematology | Admitting: Hematology

## 2020-01-15 ENCOUNTER — Other Ambulatory Visit: Payer: Self-pay

## 2020-01-15 VITALS — BP 145/77 | HR 80 | Temp 97.9°F | Resp 18 | Ht 66.0 in | Wt 174.5 lb

## 2020-01-15 DIAGNOSIS — R2230 Localized swelling, mass and lump, unspecified upper limb: Secondary | ICD-10-CM | POA: Insufficient documentation

## 2020-01-15 DIAGNOSIS — D61818 Other pancytopenia: Secondary | ICD-10-CM | POA: Diagnosis not present

## 2020-01-15 DIAGNOSIS — Z86718 Personal history of other venous thrombosis and embolism: Secondary | ICD-10-CM | POA: Insufficient documentation

## 2020-01-15 DIAGNOSIS — F17210 Nicotine dependence, cigarettes, uncomplicated: Secondary | ICD-10-CM | POA: Diagnosis not present

## 2020-01-15 DIAGNOSIS — E611 Iron deficiency: Secondary | ICD-10-CM | POA: Diagnosis not present

## 2020-01-15 DIAGNOSIS — Z833 Family history of diabetes mellitus: Secondary | ICD-10-CM | POA: Diagnosis not present

## 2020-01-15 DIAGNOSIS — E538 Deficiency of other specified B group vitamins: Secondary | ICD-10-CM | POA: Diagnosis not present

## 2020-01-15 DIAGNOSIS — Z8249 Family history of ischemic heart disease and other diseases of the circulatory system: Secondary | ICD-10-CM | POA: Insufficient documentation

## 2020-01-15 DIAGNOSIS — D510 Vitamin B12 deficiency anemia due to intrinsic factor deficiency: Secondary | ICD-10-CM

## 2020-01-15 DIAGNOSIS — D509 Iron deficiency anemia, unspecified: Secondary | ICD-10-CM

## 2020-01-15 LAB — CBC WITH DIFFERENTIAL/PLATELET
Abs Immature Granulocytes: 0.01 10*3/uL (ref 0.00–0.07)
Basophils Absolute: 0.1 10*3/uL (ref 0.0–0.1)
Basophils Relative: 1 %
Eosinophils Absolute: 0.3 10*3/uL (ref 0.0–0.5)
Eosinophils Relative: 5 %
HCT: 47.8 % — ABNORMAL HIGH (ref 36.0–46.0)
Hemoglobin: 16 g/dL — ABNORMAL HIGH (ref 12.0–15.0)
Immature Granulocytes: 0 %
Lymphocytes Relative: 21 %
Lymphs Abs: 1.3 10*3/uL (ref 0.7–4.0)
MCH: 32 pg (ref 26.0–34.0)
MCHC: 33.5 g/dL (ref 30.0–36.0)
MCV: 95.6 fL (ref 80.0–100.0)
Monocytes Absolute: 0.5 10*3/uL (ref 0.1–1.0)
Monocytes Relative: 8 %
Neutro Abs: 4.3 10*3/uL (ref 1.7–7.7)
Neutrophils Relative %: 65 %
Platelets: 154 10*3/uL (ref 150–400)
RBC: 5 MIL/uL (ref 3.87–5.11)
RDW: 13 % (ref 11.5–15.5)
WBC: 6.5 10*3/uL (ref 4.0–10.5)
nRBC: 0 % (ref 0.0–0.2)

## 2020-01-15 LAB — CMP (CANCER CENTER ONLY)
ALT: 17 U/L (ref 0–44)
AST: 18 U/L (ref 15–41)
Albumin: 3.7 g/dL (ref 3.5–5.0)
Alkaline Phosphatase: 85 U/L (ref 38–126)
Anion gap: 5 (ref 5–15)
BUN: 18 mg/dL (ref 8–23)
CO2: 32 mmol/L (ref 22–32)
Calcium: 10 mg/dL (ref 8.9–10.3)
Chloride: 104 mmol/L (ref 98–111)
Creatinine: 0.95 mg/dL (ref 0.44–1.00)
GFR, Estimated: >60 mL/min (ref >60)
Glucose, Bld: 93 mg/dL (ref 70–99)
Potassium: 4.3 mmol/L (ref 3.5–5.1)
Sodium: 141 mmol/L (ref 135–145)
Total Bilirubin: 0.4 mg/dL (ref 0.3–1.2)
Total Protein: 7.5 g/dL (ref 6.5–8.1)

## 2020-01-15 LAB — IRON AND TIBC
Iron: 90 ug/dL (ref 41–142)
Saturation Ratios: 27 % (ref 21–57)
TIBC: 335 ug/dL (ref 236–444)
UIBC: 245 ug/dL (ref 120–384)

## 2020-01-15 LAB — VITAMIN B12: Vitamin B-12: 338 pg/mL (ref 180–914)

## 2020-01-15 LAB — FERRITIN: Ferritin: 58 ng/mL (ref 11–307)

## 2020-01-15 NOTE — Progress Notes (Signed)
HEMATOLOGY/ONCOLOGY CLINIC NOTE  Date of Service: 01/15/2020  Patient Care Team: Inda Coke, [place] as PCP - General (Physician Assistant) Hayden Pedro, MD as Consulting Physician (Ophthalmology) Brunetta Genera, MD as Consulting Physician (Hematology)  CHIEF COMPLAINTS/PURPOSE OF CONSULTATION:  F/ for Pancytopenia, B12 def, iron def and DVT.  HISTORY OF PRESENTING ILLNESS:   Jodi Myers is a wonderful 69 y.o. female who has been referred to [place] by Dr. Cathlean Sauer for evaluation and management of Pancytopenia. She is accompanied today by her daughter. The pt reports that she is doing well overall.   The pt reports that she first began feeling unwell 10 days ago, beginning to feel fatigued and SOB. The pt notes that she was eating well until a few days ago, and denies any dietary restrictions. Denies thyroid problems. She recently began calcium and Vitamin D replacement for osteoporosis. She notes that her weight has been stable as have her bowel movements. She denies any fevers, chills, night sweats or unexpected weight loss  The pt notes that her ankle was swollen for the past few days, and after her VAS [place] she was placed on Xarelto.   The pt notes that her SOB has completely resolved. The pt notes that her fingers felt numb yesterday which is new for her and denies any neuropathy in her feet.   The pt had a VAS [place] on 01/23/18 which revealed Right: findings consistent with acute DVT involving the right posterior tibial vein  Most recent lab results (01/23/18) of CBC w/diff and BMP is as follows: all values are WNL except for RBC at 2.50, HGB at 8.6, HCT at 24.8, MCH at 34.4, RDW at 21.5, PLT at 39k, nRBC at 0.5%, Glucose at 120, Calcium at 8.6 01/23/18 Vitamin B12 was low at <50  On review of systems, pt reports resolving fatigue, resolved SOB, recently numb fingers, stable weight, and denies blood in the stools, blood in the urine, nose bleeds, gum bleeds, abnormal vaginal  bleeding, changes in vision, changes in memory, headaches, fevers, chills, night sweats, neuropathy in feet, balance issues, abdominal pains, and any other symptoms.   On PMHx the pt reports osteoporosis. On Social Hx the pt reports smoking 1-2 packs of cigarettes each day On Family Hx the pt reports mother with heart issues and rheumatic heart disease, sister with Hashimoto's thyroiditis and DM type I  Interval History: Jodi Myers returns today for management and evaluation of her pernicious anemia and DVT. The patient's last visit with [place] was on 07/16/2019. The pt reports that she is doing well overall.  The pt reports that her energy levels are good and denies any bloody or black stools. She received one dose of IV Iron since our last visit. Pt continues monthly Vitamin B12 injections with the help of her daughter. Pt has received her COVID19 booster and annual flu vaccine. She is also up to date with her Shingles and Pneumonia vaccines. She has continued to smoke.   Lab results today (01/15/20) of CBC w/diff and CMP is as follows: all values are WNL except for Hgb at 16.0, HCT at 47.8. 01/15/2020 Ferritin at 58 01/15/2020 Vitamin B12 at 338 01/15/2020 Iron Panel is as follows: Iron at 90, TIBC at 335, Sat Ratios at 27, UIBC at 245  On review of systems, pt reports lump on thumb and denies leg pain/swelling, fatigue, melena, bloody stools, abdominal pain and any other symptoms.   MEDICAL HISTORY:  Past Medical History:  Diagnosis Date  .  Anemia 07/2018   bloood transfusion administered   . Coronary artery disease     SURGICAL HISTORY: Past Surgical History:  Procedure Laterality Date  . TONSILLECTOMY      SOCIAL HISTORY: Social History   Socioeconomic History  . Marital status: Married    Spouse name: Not on file  . Number of children: Not on file  . Years of education: Not on file  . Highest education level: Not on file  Occupational History  . Not on file  Tobacco  Use  . Smoking status: Current Every Day Smoker    Packs/day: 1.50    Years: 49.00    Pack years: 73.50    Types: Cigarettes  . Smokeless tobacco: Never Used  Vaping Use  . Vaping Use: Never used  Substance and Sexual Activity  . Alcohol use: Yes    Alcohol/week: 7.0 standard drinks    Types: 7 Cans of beer per week    Comment: a beer a day  . Drug use: Never  . Sexual activity: Not on file  Other Topics Concern  . Not on file  Social History Narrative  . Not on file   Social Determinants of Health   Financial Resource Strain:   . Difficulty of Paying Living Expenses: Not on file  Food Insecurity:   . Worried About Charity fundraiser in the Last Year: Not on file  . Ran Out of Food in the Last Year: Not on file  Transportation Needs:   . Lack of Transportation (Medical): Not on file  . Lack of Transportation (Non-Medical): Not on file  Physical Activity:   . Days of Exercise per Week: Not on file  . Minutes of Exercise per Session: Not on file  Stress:   . Feeling of Stress : Not on file  Social Connections:   . Frequency of Communication with Friends and Family: Not on file  . Frequency of Social Gatherings with Friends and Family: Not on file  . Attends Religious Services: Not on file  . Active Member of Clubs or Organizations: Not on file  . Attends Archivist Meetings: Not on file  . Marital Status: Not on file  Intimate Partner Violence:   . Fear of Current or Ex-Partner: Not on file  . Emotionally Abused: Not on file  . Physically Abused: Not on file  . Sexually Abused: Not on file    FAMILY HISTORY: Family History  Problem Relation Age of Onset  . Diabetes Mellitus II Mother   . Rheum arthritis Mother   . CVA Mother   . Diabetes Mellitus I Sister   . Hashimoto's thyroiditis Sister   . Heart attack Father   . Colon cancer Neg Hx   . Liver disease Neg Hx     ALLERGIES:  is allergic to mango flavor, poison ivy extract, and sporanox  [itraconazole].  MEDICATIONS:  Current Outpatient Medications  Medication Sig Dispense Refill  . acetaminophen (TYLENOL) 325 MG tablet Take 2 tablets (650 mg total) by mouth every 6 (six) hours as needed for mild pain (or Fever >/= 101). 20 tablet 0  . ASPIRIN ADULT LOW STRENGTH 81 MG EC tablet TAKE 1 TABLET BY MOUTH EVERY DAY 90 tablet 4  . BESIVANCE 0.6 % SUSP Place 1 drop into the left eye See admin instructions. Instill 1 drop into left eye 4 times a day for 2 days after each monthly eye injection    . CALCIUM PO Take 1 tablet by  mouth daily.    . cyanocobalamin (,VITAMIN B-12,) 1000 MCG/ML injection Inject 1 mL (1,000 mcg total) into the skin every 30 (thirty) days. 6 mL 1  . pantoprazole (PROTONIX) 40 MG tablet TAKE 1 TABLET BY MOUTH EVERY DAY 90 tablet 1  . VITAMIN D PO Take 1 tablet by mouth daily.     No current facility-administered medications for this visit.    REVIEW OF SYSTEMS:   A 10+ POINT REVIEW OF SYSTEMS WAS OBTAINED including neurology, dermatology, psychiatry, cardiac, respiratory, lymph, extremities, GI, GU, Musculoskeletal, constitutional, breasts, reproductive, HEENT.  All pertinent positives are noted in the HPI.  All others are negative.    PHYSICAL EXAMINATION:  There were no vitals filed for this visit. There were no vitals filed for this visit. .There is no height or weight on file to calculate BMI.   GENERAL:alert, in no acute distress and comfortable SKIN: no acute rashes, no significant lesions EYES: conjunctiva are pink and non-injected, sclera anicteric OROPHARYNX: MMM, no exudates, no oropharyngeal erythema or ulceration NECK: supple, no JVD LYMPH:  no palpable lymphadenopathy in the cervical, axillary or inguinal regions LUNGS: clear to auscultation b/l with normal respiratory effort HEART: regular rate & rhythm ABDOMEN:  normoactive bowel sounds , non tender, not distended. No palpable hepatosplenomegaly.  Extremity: no pedal edema, likely  ganglion cyst at the base of left thumb PSYCH: alert & oriented x 3 with fluent speech NEURO: no focal motor/sensory deficits  LABORATORY DATA:  I have reviewed the data as listed  . CBC Latest Ref Rng & Units 07/16/2019 01/15/2019 10/02/2018  WBC 4.0 - 10.5 K/uL 5.3 6.4 6.5  Hemoglobin 12.0 - 15.0 g/dL 15.2(H) 15.9(H) 15.5(H)  Hematocrit 36 - 46 % 46.5(H) 47.2(H) 47.6(H)  Platelets 150 - 400 K/uL 197 166 174    . CMP Latest Ref Rng & Units 10/02/2018 07/18/2018 07/17/2018  Glucose 70 - 99 mg/dL 106(H) 103(H) 127(H)  BUN 8 - 23 mg/dL 15 15 18   Creatinine 0.44 - 1.00 mg/dL 1.01(H) 0.78 0.89  Sodium 135 - 145 mmol/L 139 139 139  Potassium 3.5 - 5.1 mmol/L 3.9 3.4(L) 3.5  Chloride 98 - 111 mmol/L 103 108 106  CO2 22 - 32 mmol/L 25 22 26   Calcium 8.9 - 10.3 mg/dL 9.8 9.0 9.3  Total Protein 6.5 - 8.1 g/dL 7.8 - 6.3(L)  Total Bilirubin 0.3 - 1.2 mg/dL 0.3 - 0.3  Alkaline Phos 38 - 126 U/L 73 - 61  AST 15 - 41 U/L 16 - 17  ALT 0 - 44 U/L 12 - 10   Component     Latest Ref Rng & Units 01/23/2018 01/24/2018  Iron     28 - 170 ug/dL 299 (H)   TIBC     250 - 450 ug/dL 335   Saturation Ratios     10.4 - 31.8 % 89 (H)   UIBC     ug/dL 36   Retic Ct Pct     0.4 - 3.1 % 2.4   RBC.     3.87 - 5.11 MIL/uL 1.31 (L)   Retic Count, Absolute     19.0 - 186.0 K/uL 31.7   Immature Retic Fract     2.3 - 15.9 % 12.3   Vitamin B12     180 - 914 pg/mL <50 (L)   Folate     >5.9 ng/mL 7.5   Ferritin     11 - 307 ng/mL 66   Intrinsic Factor  0.0 - 1.1 AU/mL  18.2 (H)  Parietal Cell Antibody-IgG     0.0 - 20.0 Units  70.1 (H)   . Lab Results  Component Value Date   IRON 48 01/15/2019   TIBC 301 01/15/2019   IRONPCTSAT 16 (L) 01/15/2019   (Iron and TIBC)  Lab Results  Component Value Date   FERRITIN 39 07/16/2019   B12 --- 435  RADIOGRAPHIC STUDIES:  I have personally reviewed the radiological images as listed and agreed with the findings in the report. CT CHEST LUNG CA  SCREEN LOW DOSE W/O CM  Result Date: 01/10/2020 CLINICAL DATA:  68 year old female current smoker with 76 pack-year history of smoking. Lung cancer screening examination. EXAM: CT CHEST WITHOUT CONTRAST LOW-DOSE FOR LUNG CANCER SCREENING TECHNIQUE: Multidetector CT imaging of the chest was performed following the standard protocol without IV contrast. COMPARISON:  Low-dose lung cancer screening chest CT 12/21/2018. FINDINGS: Cardiovascular: Heart size is normal. There is no significant pericardial fluid, thickening or pericardial calcification. There is aortic atherosclerosis, as well as atherosclerosis of the great vessels of the mediastinum and the coronary arteries, including calcified atherosclerotic plaque in the left anterior descending, left circumflex and right coronary arteries. Calcifications of the aortic valve. Mediastinum/Nodes: No pathologically enlarged mediastinal or hilar lymph nodes. Please note that accurate exclusion of hilar adenopathy is limited on noncontrast CT scans. Several densely calcified mediastinal lymph nodes incidentally noted. Esophagus is unremarkable in appearance. No axillary lymphadenopathy. Lungs/Pleura: Multiple small pulmonary nodules are again noted throughout the lungs bilaterally, largest of which is in the right lower lobe (axial image 153 of series 3), with a volume derived mean diameter of 4.3 mm. No other larger more suspicious appearing pulmonary nodules or masses are noted. No acute consolidative airspace disease. No pleural effusions. Diffuse bronchial wall thickening with mild centrilobular and paraseptal emphysema. Upper Abdomen: Aortic atherosclerosis. Several calcified granulomas are noted in the liver and spleen. Musculoskeletal: There are no aggressive appearing lytic or blastic lesions noted in the visualized portions of the skeleton. IMPRESSION: 1. Lung-RADS 2S, benign appearance or behavior. Continue annual screening with low-dose chest CT without  contrast in 12 months. 2. The "S" modifier above refers to potentially clinically significant non lung cancer related findings. Specifically, there is aortic atherosclerosis, in addition to 3 vessel coronary artery disease. Assessment for potential risk factor modification, dietary therapy or pharmacologic therapy may be warranted, if clinically indicated. 3. Mild diffuse bronchial wall thickening with mild centrilobular and paraseptal emphysema; imaging findings suggestive of underlying COPD. 4. There are calcifications of the aortic valve. Echocardiographic correlation for evaluation of potential valvular dysfunction may be warranted if clinically indicated. Aortic Atherosclerosis (ICD10-I70.0) and Emphysema (ICD10-J43.9). Electronically Signed   By: Vinnie Langton M.D.   On: 01/10/2020 09:24    ASSESSMENT & PLAN:   69 y.o. female with  1. Pancytopenia likely due to severe B12 deficiency Patient's labs upon initial presentation 01/23/18, Vitamin B12 low at <50. S/p blood transfusion the pt's HGB increased from 4.9 to 8.6.   Anemia a combination of pernicious anemia causing B12 and iron deficiency, and a concern of GI bleeding  2. Severe B12 deficiency due to pernicious anemia +ve parietal cell and Intrinsic factor antibodies. 01/24/18 anti-parietal antibody and intrinsic factor antibody are both elevated consistent with pernicious anemia.  B12 upto 1500  3. Acute DVT rt posterior tibial veins. Likely triggering event - 2 packs/day of cigarette smoking.  4. Iron deficiency  01/23/18 VAS US revealed  Right: findings consistent  with acute DVT involving the right posterior tibial vein   S/p 6 months of Xarelto  4. Septated cyst in right kidney As seen on 01/24/18 CT A/P Ordering repeat US for further characterization  01/01/2019 US Renal ([id number]) which revealed "Bilateral renal cysts. The largest cyst is on the right measuring up to 9.8 cm with septations and no definitive solid  components. This is similar appearance to the CT scan from January 04, 2018. Three cysts in the left kidney." 12/12/2018 Mammography which revealed "There is no mammographic evidence of malignancy. Routine mammographic evaluation in 1 year is recommended (12/13/2019)." 12/21/2018 CT Chest/Lung ([id number]) which revealed "1. Lung-RADS 2, benign appearance or behavior. Continue annual screening with low-dose chest CT without contrast in 12 months. 2. Aortic atherosclerosis (ICD10-170.0). Coronary artery calcification. 3.  Emphysema (ICD10-J43.9)."  PLAN: -Discussed pt labwork today, 01/15/20; no anemia, blood counts and chemistries are nml, B12 & Sat Ratios are WNL, Ferritin is slightly improved. -Advised pt that her thumb growth could be a Sesamoid bone or Ganglion cyst - Recommend pt have this evaluated by PCP if it begins to grow rapidly or becomes inflamed.  -Will continue IV Iron replacement as needed.  -Continue monthly Vitamin B12 [place] -Continue 81mg  Aspirin -Will see back in 6 months with labs    FOLLOW UP: RTC with Dr Irene Limbo with labs in 6 months   The total time spent in the appt was 20 minutes and more than 50% was on counseling and direct patient cares.  All of the patient's questions were answered with apparent satisfaction. The patient knows to call the clinic with any problems, questions or concerns.    Sullivan Lone MD [place] AAHIVMS [facility] Hematology/Oncology Physician [facility]  (Office):       [phone] (Work cell):  [phone] (Fax):           [phone]  01/15/2020 9:03 AM  I, Yevette Edwards, am acting as a scribe for Dr. Sullivan Lone.   .I have reviewed the above documentation for accuracy and completeness, and I agree with the above. Brunetta Genera MD

## 2020-01-15 NOTE — Telephone Encounter (Signed)
Scheduled per los. Gave avs and calendar  

## 2020-01-16 NOTE — Progress Notes (Signed)
Please call patient and let them  know their  low dose Ct was read as a Lung RADS 2: nodules that are benign in appearance and behavior with a very low likelihood of becoming a clinically active cancer due to size or lack of growth. Recommendation per radiology is for a repeat LDCT in 12 months. .Please let them  know we will order and schedule their  annual screening scan for 01/2021. Please let them  know there was notation of CAD on their  scan.  Please remind the patient  that this is a non-gated exam therefore degree or severity of disease  cannot be determined. Please have them  follow up with their PCP regarding potential risk factor modification, dietary therapy or pharmacologic therapy if clinically indicated. Pt.  is not  currently on statin therapy. Please place order for annual  screening scan for  01/2021 and fax results to PCP. Thanks so much.  Please let patient know she has emphysema. I do not see any cardiology notes, please have her follow up with her PCP about a cards referraL  Inda Coke, PA Please note the finding of calcifications on the patient's aortic valve, and consider an echo. Thanks so much

## 2020-01-20 ENCOUNTER — Other Ambulatory Visit: Payer: Self-pay | Admitting: *Deleted

## 2020-01-20 DIAGNOSIS — F17210 Nicotine dependence, cigarettes, uncomplicated: Secondary | ICD-10-CM

## 2020-01-20 DIAGNOSIS — Z87891 Personal history of nicotine dependence: Secondary | ICD-10-CM

## 2020-01-22 ENCOUNTER — Other Ambulatory Visit: Payer: Self-pay

## 2020-01-22 ENCOUNTER — Encounter (INDEPENDENT_AMBULATORY_CARE_PROVIDER_SITE_OTHER): Payer: Medicare Other | Admitting: Ophthalmology

## 2020-01-22 DIAGNOSIS — H2513 Age-related nuclear cataract, bilateral: Secondary | ICD-10-CM | POA: Diagnosis not present

## 2020-01-22 DIAGNOSIS — H43813 Vitreous degeneration, bilateral: Secondary | ICD-10-CM

## 2020-01-22 DIAGNOSIS — H442A3 Degenerative myopia with choroidal neovascularization, bilateral eye: Secondary | ICD-10-CM | POA: Diagnosis not present

## 2020-01-31 DIAGNOSIS — H2513 Age-related nuclear cataract, bilateral: Secondary | ICD-10-CM | POA: Diagnosis not present

## 2020-01-31 DIAGNOSIS — B394 Histoplasmosis capsulati, unspecified: Secondary | ICD-10-CM | POA: Diagnosis not present

## 2020-03-11 ENCOUNTER — Encounter (INDEPENDENT_AMBULATORY_CARE_PROVIDER_SITE_OTHER): Payer: Medicare Other | Admitting: Ophthalmology

## 2020-03-11 ENCOUNTER — Other Ambulatory Visit: Payer: Self-pay

## 2020-03-11 DIAGNOSIS — H43813 Vitreous degeneration, bilateral: Secondary | ICD-10-CM

## 2020-03-11 DIAGNOSIS — H318 Other specified disorders of choroid: Secondary | ICD-10-CM

## 2020-03-11 DIAGNOSIS — B399 Histoplasmosis, unspecified: Secondary | ICD-10-CM | POA: Diagnosis not present

## 2020-03-11 DIAGNOSIS — H32 Chorioretinal disorders in diseases classified elsewhere: Secondary | ICD-10-CM | POA: Diagnosis not present

## 2020-03-11 DIAGNOSIS — H2513 Age-related nuclear cataract, bilateral: Secondary | ICD-10-CM | POA: Diagnosis not present

## 2020-03-12 DIAGNOSIS — H2511 Age-related nuclear cataract, right eye: Secondary | ICD-10-CM | POA: Diagnosis not present

## 2020-03-26 DIAGNOSIS — H268 Other specified cataract: Secondary | ICD-10-CM | POA: Diagnosis not present

## 2020-03-26 DIAGNOSIS — H25812 Combined forms of age-related cataract, left eye: Secondary | ICD-10-CM | POA: Diagnosis not present

## 2020-04-09 ENCOUNTER — Other Ambulatory Visit: Payer: Self-pay | Admitting: Hematology

## 2020-04-13 ENCOUNTER — Other Ambulatory Visit: Payer: Self-pay

## 2020-04-13 ENCOUNTER — Telehealth: Payer: Self-pay | Admitting: Physician Assistant

## 2020-04-13 ENCOUNTER — Ambulatory Visit (INDEPENDENT_AMBULATORY_CARE_PROVIDER_SITE_OTHER): Payer: Medicare Other

## 2020-04-13 DIAGNOSIS — Z0000 Encounter for general adult medical examination without abnormal findings: Secondary | ICD-10-CM | POA: Diagnosis not present

## 2020-04-13 MED ORDER — ALBUTEROL SULFATE HFA 108 (90 BASE) MCG/ACT IN AERS: 2.0000 | INHALATION_SPRAY | Freq: Four times a day (QID) | RESPIRATORY_TRACT | 0 refills | Status: DC | PRN

## 2020-04-13 NOTE — Patient Instructions (Addendum)
Jodi Myers , Thank you for taking time to come for your Medicare Wellness Visit. I appreciate your ongoing commitment to your health goals. Please review the following plan we discussed and let me know if I can assist you in the future.   Screening recommendations/referrals: Colonoscopy: Declined  Mammogram: Done  Bone Density: Done 12/06/17 Recommended yearly ophthalmology/optometry visit for glaucoma screening and checkup Recommended yearly dental visit for hygiene and checkup  Vaccinations: Influenza vaccine: Done 12/21/19 Pneumococcal vaccine: Up to date Tdap vaccine: Up to date Shingles vaccine: Completed  11/19/18 & 02/25/19 Covid-19:Completed 1/28, 2/25, & 01/02/20  Advanced directives: Please bring a copy of your health care power of attorney and living will to the office at your convenience.  Conditions/risks identified: None at this time  Next appointment: Follow up in one year for your annual wellness visit    Preventive Care 65 Years and Older, Female Preventive care refers to lifestyle choices and visits with your health care provider that can promote health and wellness. What does preventive care include?  A yearly physical exam. This is also called an annual well check.  Dental exams once or twice a year.  Routine eye exams. Ask your health care provider how often you should have your eyes checked.  Personal lifestyle choices, including:  Daily care of your teeth and gums.  Regular physical activity.  Eating a healthy diet.  Avoiding tobacco and drug use.  Limiting alcohol use.  Practicing safe sex.  Taking low-dose aspirin every day.  Taking vitamin and mineral supplements as recommended by your health care provider. What happens during an annual well check? The services and screenings done by your health care provider during your annual well check will depend on your age, overall health, lifestyle risk factors, and family history of  disease. Counseling  Your health care provider may ask you questions about your:  Alcohol use.  Tobacco use.  Drug use.  Emotional well-being.  Home and relationship well-being.  Sexual activity.  Eating habits.  History of falls.  Memory and ability to understand (cognition).  Work and work Statistician.  Reproductive health. Screening  You may have the following tests or measurements:  Height, weight, and BMI.  Blood pressure.  Lipid and cholesterol levels. These may be checked every 5 years, or more frequently if you are over 50 years old.  Skin check.  Lung cancer screening. You may have this screening every year starting at age 15 if you have a 30-pack-year history of smoking and currently smoke or have quit within the past 15 years.  Fecal occult blood test (FOBT) of the stool. You may have this test every year starting at age 59.  Flexible sigmoidoscopy or colonoscopy. You may have a sigmoidoscopy every 5 years or a colonoscopy every 10 years starting at age 77.  Hepatitis C blood test.  Hepatitis B blood test.  Sexually transmitted disease (STD) testing.  Diabetes screening. This is done by checking your blood sugar (glucose) after you have not eaten for a while (fasting). You may have this done every 1-3 years.  Bone density scan. This is done to screen for osteoporosis. You may have this done starting at age 15.  Mammogram. This may be done every 1-2 years. Talk to your health care provider about how often you should have regular mammograms. Talk with your health care provider about your test results, treatment options, and if necessary, the need for more tests. Vaccines  Your health care provider may recommend  certain vaccines, such as:  Influenza vaccine. This is recommended every year.  Tetanus, diphtheria, and acellular pertussis (Tdap, Td) vaccine. You may need a Td booster every 10 years.  Zoster vaccine. You may need this after age  52.  Pneumococcal 13-valent conjugate (PCV13) vaccine. One dose is recommended after age 28.  Pneumococcal polysaccharide (PPSV23) vaccine. One dose is recommended after age 13. Talk to your health care provider about which screenings and vaccines you need and how often you need them. This information is not intended to replace advice given to you by your health care provider. Make sure you discuss any questions you have with your health care provider. Document Released: 04/17/2015 Document Revised: 12/09/2015 Document Reviewed: 01/20/2015 Elsevier Interactive Patient Education  2017 [place] Prevention in the Home Falls can cause injuries. They can happen to people of all ages. There are many things you can do to make your home safe and to help prevent falls. What can I do on the outside of my home?  Regularly fix the edges of walkways and driveways and fix any cracks.  Remove anything that might make you trip as you walk through a door, such as a raised step or threshold.  Trim any bushes or trees on the path to your home.  Use bright outdoor lighting.  Clear any walking paths of anything that might make someone trip, such as rocks or tools.  Regularly check to see if handrails are loose or broken. Make sure that both sides of any steps have handrails.  Any raised decks and porches should have guardrails on the edges.  Have any leaves, snow, or ice cleared regularly.  Use sand or salt on walking paths during winter.  Clean up any spills in your garage right away. This includes oil or grease spills. What can I do in the bathroom?  Use night lights.  Install grab bars by the toilet and in the tub and shower. Do not use towel bars as grab bars.  Use non-skid mats or decals in the tub or shower.  If you need to sit down in the shower, use a plastic, non-slip stool.  Keep the floor dry. Clean up any water that spills on the floor as soon as it happens.  Remove  soap buildup in the tub or shower regularly.  Attach bath mats securely with double-sided non-slip rug tape.  Do not have throw rugs and other things on the floor that can make you trip. What can I do in the bedroom?  Use night lights.  Make sure that you have a light by your bed that is easy to reach.  Do not use any sheets or blankets that are too big for your bed. They should not hang down onto the floor.  Have a firm chair that has side arms. You can use this for support while you get dressed.  Do not have throw rugs and other things on the floor that can make you trip. What can I do in the kitchen?  Clean up any spills right away.  Avoid walking on wet floors.  Keep items that you use a lot in easy-to-reach places.  If you need to reach something above you, use a strong step stool that has a grab bar.  Keep electrical cords out of the way.  Do not use floor polish or wax that makes floors slippery. If you must use wax, use non-skid floor wax.  Do not have throw rugs and other  things on the floor that can make you trip. What can I do with my stairs?  Do not leave any items on the stairs.  Make sure that there are handrails on both sides of the stairs and use them. Fix handrails that are broken or loose. Make sure that handrails are as long as the stairways.  Check any carpeting to make sure that it is firmly attached to the stairs. Fix any carpet that is loose or worn.  Avoid having throw rugs at the top or bottom of the stairs. If you do have throw rugs, attach them to the floor with carpet tape.  Make sure that you have a light switch at the top of the stairs and the bottom of the stairs. If you do not have them, ask someone to add them for you. What else can I do to help prevent falls?  Wear shoes that:  Do not have high heels.  Have rubber bottoms.  Are comfortable and fit you well.  Are closed at the toe. Do not wear sandals.  If you use a  stepladder:  Make sure that it is fully opened. Do not climb a closed stepladder.  Make sure that both sides of the stepladder are locked into place.  Ask someone to hold it for you, if possible.  Clearly mark and make sure that you can see:  Any grab bars or handrails.  First and last steps.  Where the edge of each step is.  Use tools that help you move around (mobility aids) if they are needed. These include:  Canes.  Walkers.  Scooters.  Crutches.  Turn on the lights when you go into a dark area. Replace any light bulbs as soon as they burn out.  Set up your furniture so you have a clear path. Avoid moving your furniture around.  If any of your floors are uneven, fix them.  If there are any pets around you, be aware of where they are.  Review your medicines with your doctor. Some medicines can make you feel dizzy. This can increase your chance of falling. Ask your doctor what other things that you can do to help prevent falls. This information is not intended to replace advice given to you by your health care provider. Make sure you discuss any questions you have with your health care provider. Document Released: 01/15/2009 Document Revised: 08/27/2015 Document Reviewed: 04/25/2014 Elsevier Interactive Patient Education  2017 [employer].

## 2020-04-13 NOTE — Progress Notes (Addendum)
Virtual Visit via Telephone Note  I connected with  Jodi Myers on 04/13/20 at  8:00 AM EST by telephone and verified that I am speaking with the correct person using two identifiers.  Medicare Annual Wellness visit completed telephonically due to Covid-19 pandemic.   Persons participating in this call: This Health Coach and this patient.   Location: Patient: Home Provider: Office   I discussed the limitations, risks, security and privacy concerns of performing an evaluation and management service by telephone and the availability of in person appointments. The patient expressed understanding and agreed to proceed.  Unable to perform video visit due to video visit attempted and failed and/or patient does not have video capability.   Some vital signs may be absent or patient reported.   [name], LPN    Subjective:   Jodi Myers is a 70 y.o. female who presents for Medicare Annual (Subsequent) preventive examination.  Review of Systems     Cardiac Risk Factors include: advanced age (>1men, >48 women)     Objective:    There were no vitals filed for this visit. There is no height or weight on file to calculate BMI.  Advanced Directives 04/13/2020 07/16/2019 11/12/2018 07/18/2018 07/17/2018 02/20/2018 01/23/2018  Does Patient Have a Medical Advance Directive? Yes Yes Yes Yes Yes Yes Yes  Type of Paramedic of [place];Living will [place];Living will [place];Living will [place];Living will [place];Living will [place];Living will [place];Living will  Does patient want to make changes to medical advance directive? - No - Patient declined No - Patient declined No - Patient declined - - No - Patient declined  Copy of [place] in Chart? No - copy requested No - copy requested No - copy requested No - copy  requested No - copy requested - No - copy requested  Would patient like information on creating a medical advance directive? - No - Patient declined - No - Patient declined Yes (ED - Information included in AVS) - No - Patient declined    Current Medications (verified) Outpatient Encounter Medications as of 04/13/2020  Medication Sig  . acetaminophen (TYLENOL) 325 MG tablet Take 2 tablets (650 mg total) by mouth every 6 (six) hours as needed for mild pain (or Fever >/= 101).  . ASPIRIN ADULT LOW STRENGTH 81 MG EC tablet TAKE 1 TABLET BY MOUTH EVERY DAY  . BESIVANCE 0.6 % SUSP Place 1 drop into the left eye See admin instructions. Instill 1 drop into left eye 4 times a day for 2 days after each monthly eye injection  . CALCIUM PO Take 1 tablet by mouth daily.  . cyanocobalamin (,VITAMIN B-12,) 1000 MCG/ML injection INJECT 1 ML (1,000 MCG TOTAL) INTO THE MUSCLE EVERY 30 (THIRTY) DAYS  . pantoprazole (PROTONIX) 40 MG tablet TAKE 1 TABLET BY MOUTH EVERY DAY  . prednisoLONE acetate (PRED FORTE) 1 % ophthalmic suspension SMARTSIG:In Eye(s)  . VITAMIN D PO Take 1 tablet by mouth daily.   No facility-administered encounter medications on file as of 04/13/2020.    Allergies (verified) Mango flavor, Poison ivy extract, and Sporanox [itraconazole]   History: Past Medical History:  Diagnosis Date  . Anemia 07/2018   bloood transfusion administered   . Coronary artery disease    Past Surgical History:  Procedure Laterality Date  . TONSILLECTOMY     Family History  Problem Relation Age of Onset  .  Diabetes Mellitus II Mother   . Rheum arthritis Mother   . CVA Mother   . Diabetes Mellitus I Sister   . Hashimoto's thyroiditis Sister   . Heart attack Father   . Colon cancer Neg Hx   . Liver disease Neg Hx    Social History   Socioeconomic History  . Marital status: Married    Spouse name: Not on file  . Number of children: Not on file  . Years of education: Not on file  . Highest  education level: Not on file  Occupational History  . Occupation: retired  Tobacco Use  . Smoking status: Current Every Day Smoker    Packs/day: 1.50    Years: 49.00    Pack years: 73.50    Types: Cigarettes  . Smokeless tobacco: Never Used  Vaping Use  . Vaping Use: Never used  Substance and Sexual Activity  . Alcohol use: Yes    Alcohol/week: 7.0 standard drinks    Types: 7 Cans of beer per week    Comment: a beer a day  . Drug use: Never  . Sexual activity: Not on file  Other Topics Concern  . Not on file  Social History Narrative  . Not on file   Social Determinants of Health   Financial Resource Strain: Low Risk   . Difficulty of Paying Living Expenses: Not hard at all  Food Insecurity: No Food Insecurity  . Worried About Charity fundraiser in the Last Year: Never true  . Ran Out of Food in the Last Year: Never true  Transportation Needs: No Transportation Needs  . Lack of Transportation (Medical): No  . Lack of Transportation (Non-Medical): No  Physical Activity: Inactive  . Days of Exercise per Week: 0 days  . Minutes of Exercise per Session: 0 min  Stress: No Stress Concern Present  . Feeling of Stress : Not at all  Social Connections: Moderately Isolated  . Frequency of Communication with Friends and Family: More than three times a week  . Frequency of Social Gatherings with Friends and Family: More than three times a week  . Attends Religious Services: Never  . Active Member of Clubs or Organizations: No  . Attends Archivist Meetings: Never  . Marital Status: Married    Tobacco Counseling Ready to quit: Not Answered Counseling given: Not Answered   Clinical Intake:  Pre-visit preparation completed: Yes  Pain : No/denies pain     BMI - recorded: 28.18 Nutritional Status: BMI 25 -29 Overweight Nutritional Risks: None Diabetes: No  How often do you need to have someone help you when you read instructions, pamphlets, or other written  materials from your doctor or pharmacy?: 1 - Never  Diabetic?no  Interpreter Needed?: No  Information entered by :: Charlott Rakes LPN   Activities of Daily Living In your present state of health, do you have any difficulty performing the following activities: 04/13/2020 07/11/2019  Hearing? N N  Vision? N N  Difficulty concentrating or making decisions? N N  Walking or climbing stairs? N N  Dressing or bathing? N N  Doing errands, shopping? N N  Preparing Food and eating ? N -  Using the Toilet? N -  In the past six months, have you accidently leaked urine? Y -  Comment accident x 1 at night -  Do you have problems with loss of bowel control? N -  Managing your Medications? N -  Managing your Finances? N -  Housekeeping or managing your Housekeeping? N -  Some recent data might be hidden    Patient Care Team: Inda Coke, [place] as PCP - General (Physician Assistant) Hayden Pedro, MD as Consulting Physician (Ophthalmology) Brunetta Genera, MD as Consulting Physician (Hematology)  Indicate any recent Medical Services you may have received from other than [facility] providers in the past year (date may be approximate).     Assessment:   This is a routine wellness examination for [place].  Hearing/Vision screen  Hearing Screening   125Hz  250Hz  500Hz  1000Hz  2000Hz  3000Hz  4000Hz  6000Hz  8000Hz   Right ear:           Left ear:           Comments: Pt denies May hearing issues   Vision Screening Comments: Pt follows Dr Marzetta Board for annual eye exams and Dr Mendel Corning from retina specialist every 7 weeks   Dietary issues and exercise activities discussed: Current Exercise Habits: The patient does not participate in regular exercise at present  Goals    . Patient Stated     None at this time      Depression Screen PHQ 2/9 Scores 04/13/2020  PHQ - 2 Score 0    Fall Risk Fall Risk  04/13/2020 07/11/2019 11/12/2018  Falls in the past year? 0 0 0  Number falls in past yr: 0 0  0  Injury with Fall? 0 0 0  Risk for fall due to : Impaired vision - -  Risk for fall due to: Comment just had catarct surgery and wears reading glasses - -  Follow up Falls prevention discussed - Falls prevention discussed    FALL RISK PREVENTION PERTAINING TO THE HOME:  Any stairs in or around the home? Yes  If so, are there any without handrails? No  Home free of loose throw rugs in walkways, pet beds, electrical cords, etc? Yes  Adequate lighting in your home to reduce risk of falls? Yes   ASSISTIVE DEVICES UTILIZED TO PREVENT FALLS:  Life alert? No  Use of a cane, walker or w/c? No  Grab bars in the bathroom? No  Shower chair or bench in shower? No  Elevated toilet seat or a handicapped toilet? No   TIMED UP AND GO:  Was the test performed? No .     Cognitive Function:     6CIT Screen 04/13/2020  What Year? 0 points  What month? 0 points  Count back from 20 0 points  Months in reverse 0 points  Repeat phrase 0 points    Immunizations Immunization History  Administered Date(s) Administered  . Influenza, High Dose Seasonal PF 01/19/2017, 11/17/2018  . Influenza,inj,Quad PF,6+ Mos 12/21/2019  . Influenza-Unspecified 01/28/2014  . PFIZER SARS-COV-2 Vaccination 05/02/2019, 05/30/2019, 01/02/2020  . Pneumococcal Conjugate-13 06/29/2015  . Pneumococcal Polysaccharide-23 08/01/2016  . Tdap 06/29/2015  . Zoster Recombinat (Shingrix) 11/19/2018, 02/25/2019    TDAP status: Up to date  Flu Vaccine status: Up to date  Done 12/21/19 Pneumococcal vaccine status: Up to date  Covid-19 vaccine status: Completed vaccines  Qualifies for Shingles Vaccine? Yes   Zostavax completed Yes   Shingrix Completed?: Yes  Screening Tests Health Maintenance  Topic Date Due  . Hepatitis C Screening  Never done  . COVID-19 Vaccine (4 - Booster for Pfizer series) 07/01/2020  . MAMMOGRAM  12/17/2020  . TETANUS/TDAP  06/28/2025  . INFLUENZA VACCINE  Completed  . DEXA SCAN   Completed  . PNA vac Low Risk Adult  Completed  . COLONOSCOPY (Pts 45-63yrs Insurance coverage will need to be confirmed)  Discontinued    Health Maintenance  Health Maintenance Due  Topic Date Due  . Hepatitis C Screening  Never done    Colorectal cancer screening: No longer required.   Mammogram status: Completed 12/18/19. Repeat every year  Bone Density status: Completed 12/06/17. Results reflect: Bone density results: OSTEOPOROSIS. Repeat every 2 years.   Additional Screening:  Hepatitis C Screening: does qualify;   Vision Screening: Recommended annual ophthalmology exams for early detection of glaucoma and other disorders of the eye. Is the patient up to date with their annual eye exam?  Yes  Who is the provider or what is the name of the office in which the patient attends annual eye exams? Dr Emeline Darling and Dr Mendel Corning follows eye care    Dental Screening: Recommended annual dental exams for proper oral hygiene  Community Resource Referral / Chronic Care Management: CRR required this visit?  No   CCM required this visit?  No      Plan:     I have personally reviewed and noted the following in the patient's chart:   . Medical and social history . Use of alcohol, tobacco or illicit drugs  . Current medications and supplements . Functional ability and status . Nutritional status . Physical activity . Advanced directives . List of other physicians . Hospitalizations, surgeries, and ER visits in previous 12 months . Vitals . Screenings to include cognitive, depression, and falls . Referrals and appointments  In addition, I have reviewed and discussed with patient certain preventive protocols, quality metrics, and best practice recommendations. A written personalized care plan for preventive services as well as general preventive health recommendations were provided to patient.     [name], LPN   [id number]   Nurse Notes: Pt is requesting Albuterol  inhaler for PRN, stating that she needs for relief after bad chest cold. Pt request you to call in to [facility] at [address] for prescription, please advise.    I have reviewed documentation for AWV and Advance Care planning provided by Health Coach, I agree with documentation, I was immediately available for any questions. Inda Coke, [place]

## 2020-04-13 NOTE — Addendum Note (Signed)
Addended by: Erlene Quan on: 04/13/2020 08:36 AM   Modules accepted: Orders

## 2020-04-13 NOTE — Telephone Encounter (Signed)
I was notified that patient would like albuterol sent in for her breathing.   I have sent this in for her.   Inda Coke PA-C

## 2020-04-13 NOTE — Telephone Encounter (Signed)
Pt notified Albuterol was sent in for her.

## 2020-04-29 ENCOUNTER — Encounter (INDEPENDENT_AMBULATORY_CARE_PROVIDER_SITE_OTHER): Payer: Medicare Other | Admitting: Ophthalmology

## 2020-04-29 ENCOUNTER — Other Ambulatory Visit: Payer: Self-pay

## 2020-04-29 DIAGNOSIS — H318 Other specified disorders of choroid: Secondary | ICD-10-CM | POA: Diagnosis not present

## 2020-04-29 DIAGNOSIS — H43813 Vitreous degeneration, bilateral: Secondary | ICD-10-CM | POA: Diagnosis not present

## 2020-04-29 DIAGNOSIS — H35052 Retinal neovascularization, unspecified, left eye: Secondary | ICD-10-CM | POA: Diagnosis not present

## 2020-05-04 ENCOUNTER — Other Ambulatory Visit: Payer: Self-pay | Admitting: Physician Assistant

## 2020-06-24 ENCOUNTER — Encounter (INDEPENDENT_AMBULATORY_CARE_PROVIDER_SITE_OTHER): Payer: Medicare Other | Admitting: Ophthalmology

## 2020-06-25 ENCOUNTER — Encounter (INDEPENDENT_AMBULATORY_CARE_PROVIDER_SITE_OTHER): Payer: Medicare Other | Admitting: Ophthalmology

## 2020-06-25 ENCOUNTER — Other Ambulatory Visit: Payer: Self-pay

## 2020-06-25 DIAGNOSIS — H32 Chorioretinal disorders in diseases classified elsewhere: Secondary | ICD-10-CM

## 2020-06-25 DIAGNOSIS — B399 Histoplasmosis, unspecified: Secondary | ICD-10-CM

## 2020-06-25 DIAGNOSIS — H43813 Vitreous degeneration, bilateral: Secondary | ICD-10-CM | POA: Diagnosis not present

## 2020-06-25 DIAGNOSIS — H318 Other specified disorders of choroid: Secondary | ICD-10-CM | POA: Diagnosis not present

## 2020-07-14 NOTE — Progress Notes (Signed)
HEMATOLOGY/ONCOLOGY CLINIC NOTE  Date of Service: 07/15/2020  Patient Care Team: Inda Coke, [place] as PCP - General (Physician Assistant) Hayden Pedro, MD as Consulting Physician (Ophthalmology) Brunetta Genera, MD as Consulting Physician (Hematology)  CHIEF COMPLAINTS/PURPOSE OF CONSULTATION:  F/ for Pancytopenia, B12 def, iron def and DVT.  HISTORY OF PRESENTING ILLNESS:   Jodi Myers is a wonderful 70 y.o. female who has been referred to [place] by Dr. Cathlean Sauer for evaluation and management of Pancytopenia. She is accompanied today by her daughter. The pt reports that she is doing well overall.   The pt reports that she first began feeling unwell 10 days ago, beginning to feel fatigued and SOB. The pt notes that she was eating well until a few days ago, and denies any dietary restrictions. Denies thyroid problems. She recently began calcium and Vitamin D replacement for osteoporosis. She notes that her weight has been stable as have her bowel movements. She denies any fevers, chills, night sweats or unexpected weight loss  The pt notes that her ankle was swollen for the past few days, and after her VAS [place] she was placed on Xarelto.   The pt notes that her SOB has completely resolved. The pt notes that her fingers felt numb yesterday which is new for her and denies any neuropathy in her feet.   The pt had a VAS [place] on 01/23/18 which revealed Right: findings consistent with acute DVT involving the right posterior tibial vein  Most recent lab results (01/23/18) of CBC w/diff and BMP is as follows: all values are WNL except for RBC at 2.50, HGB at 8.6, HCT at 24.8, MCH at 34.4, RDW at 21.5, PLT at 39k, nRBC at 0.5%, Glucose at 120, Calcium at 8.6 01/23/18 Vitamin B12 was low at <50  On review of systems, pt reports resolving fatigue, resolved SOB, recently numb fingers, stable weight, and denies blood in the stools, blood in the urine, nose bleeds, gum bleeds, abnormal vaginal  bleeding, changes in vision, changes in memory, headaches, fevers, chills, night sweats, neuropathy in feet, balance issues, abdominal pains, and any other symptoms.   On PMHx the pt reports osteoporosis. On Social Hx the pt reports smoking 1-2 packs of cigarettes each day On Family Hx the pt reports mother with heart issues and rheumatic heart disease, sister with Hashimoto's thyroiditis and DM type I  Interval History:  Jodi Myers returns today for management and evaluation of her pernicious anemia and DVT. The patient's last visit with [place] was on 01/15/2020. The pt reports that she is doing well overall.  The pt reports no new concerns or symptoms. The pt has been getting her B12 shots monthly. The pt has not noted any bleeding issues. The pt notes that she still smokes around 1.5 packs daily.  Lab results today 07/15/2020 of CBC w/diff and CMP is as follows: all values are WNL except for RBC of 5.27, Hgb of 16.4, HCT of 49.5, Glucose of 108, AST of 14. 07/15/2020 Vitamin B12 - wnl 446 07/15/2020 Ferritin is 14  On review of systems, pt reports arthritis causing pain in neck area and denies calf pain, leg pain, leg swelling,abdominal pain, back pain, bleeding issues, black/bloody stools, changes in bowel habits, and any other symptoms.  MEDICAL HISTORY:  Past Medical History:  Diagnosis Date  . Anemia 07/2018   bloood transfusion administered   . Coronary artery disease     SURGICAL HISTORY: Past Surgical History:  Procedure Laterality Date  .  TONSILLECTOMY      SOCIAL HISTORY: Social History   Socioeconomic History  . Marital status: Married    Spouse name: Not on file  . Number of children: Not on file  . Years of education: Not on file  . Highest education level: Not on file  Occupational History  . Occupation: retired  Tobacco Use  . Smoking status: Current Every Day Smoker    Packs/day: 1.50    Years: 49.00    Pack years: 73.50    Types: Cigarettes  .  Smokeless tobacco: Never Used  Vaping Use  . Vaping Use: Never used  Substance and Sexual Activity  . Alcohol use: Yes    Alcohol/week: 7.0 standard drinks    Types: 7 Cans of beer per week    Comment: a beer a day  . Drug use: Never  . Sexual activity: Not on file  Other Topics Concern  . Not on file  Social History Narrative  . Not on file   Social Determinants of Health   Financial Resource Strain: Low Risk   . Difficulty of Paying Living Expenses: Not hard at all  Food Insecurity: No Food Insecurity  . Worried About Charity fundraiser in the Last Year: Never true  . Ran Out of Food in the Last Year: Never true  Transportation Needs: No Transportation Needs  . Lack of Transportation (Medical): No  . Lack of Transportation (Non-Medical): No  Physical Activity: Inactive  . Days of Exercise per Week: 0 days  . Minutes of Exercise per Session: 0 min  Stress: No Stress Concern Present  . Feeling of Stress : Not at all  Social Connections: Moderately Isolated  . Frequency of Communication with Friends and Family: More than three times a week  . Frequency of Social Gatherings with Friends and Family: More than three times a week  . Attends Religious Services: Never  . Active Member of Clubs or Organizations: No  . Attends Archivist Meetings: Never  . Marital Status: Married  Human resources officer Violence: Not At Risk  . Fear of Current or Ex-Partner: No  . Emotionally Abused: No  . Physically Abused: No  . Sexually Abused: No    FAMILY HISTORY: Family History  Problem Relation Age of Onset  . Diabetes Mellitus II Mother   . Rheum arthritis Mother   . CVA Mother   . Diabetes Mellitus I Sister   . Hashimoto's thyroiditis Sister   . Heart attack Father   . Colon cancer Neg Hx   . Liver disease Neg Hx     ALLERGIES:  is allergic to mango flavor, poison ivy extract, and sporanox [itraconazole].  MEDICATIONS:  Current Outpatient Medications  Medication Sig  Dispense Refill  . acetaminophen (TYLENOL) 325 MG tablet Take 2 tablets (650 mg total) by mouth every 6 (six) hours as needed for mild pain (or Fever >/= 101). 20 tablet 0  . albuterol (VENTOLIN HFA) 108 (90 Base) MCG/ACT inhaler Inhale 2 puffs into the lungs every 6 (six) hours as needed for wheezing or shortness of breath. 8 g 0  . ASPIRIN ADULT LOW STRENGTH 81 MG EC tablet TAKE 1 TABLET BY MOUTH EVERY DAY 90 tablet 4  . BESIVANCE 0.6 % SUSP Place 1 drop into the left eye See admin instructions. Instill 1 drop into left eye 4 times a day for 2 days after each monthly eye injection    . CALCIUM PO Take 1 tablet by mouth daily.    Marland Kitchen  cyanocobalamin (,VITAMIN B-12,) 1000 MCG/ML injection INJECT 1 ML (1,000 MCG TOTAL) INTO THE MUSCLE EVERY 30 (THIRTY) DAYS 3 mL 3  . pantoprazole (PROTONIX) 40 MG tablet TAKE 1 TABLET BY MOUTH EVERY DAY 90 tablet 1  . prednisoLONE acetate (PRED FORTE) 1 % ophthalmic suspension SMARTSIG:In Eye(s)    . VITAMIN D PO Take 1 tablet by mouth daily.     No current facility-administered medications for this visit.    REVIEW OF SYSTEMS:   10 Point review of Systems was done is negative except as noted above.  PHYSICAL EXAMINATION:  Vitals:   07/15/20 1052  BP: (!) 144/80  Pulse: 96  Resp: 17  Temp: 98.4 F (36.9 C)  SpO2: 97%   Filed Weights   07/15/20 1052  Weight: 175 lb 1.6 oz (79.4 kg)   .Body mass index is 28.26 kg/m.   GENERAL:alert, in no acute distress and comfortable SKIN: no acute rashes, no significant lesions EYES: conjunctiva are pink and non-injected, sclera anicteric OROPHARYNX: MMM, no exudates, no oropharyngeal erythema or ulceration NECK: supple, no JVD LYMPH:  no palpable lymphadenopathy in the cervical, axillary or inguinal regions LUNGS: clear to auscultation b/l with normal respiratory effort HEART: regular rate & rhythm ABDOMEN:  normoactive bowel sounds , non tender, not distended. Extremity: no pedal edema PSYCH: alert &  oriented x 3 with fluent speech NEURO: no focal motor/sensory deficits  LABORATORY DATA:  I have reviewed the data as listed  . CBC Latest Ref Rng & Units 07/15/2020 01/15/2020 07/16/2019  WBC 4.0 - 10.5 K/uL 7.1 6.5 5.3  Hemoglobin 12.0 - 15.0 g/dL 16.4(H) 16.0(H) 15.2(H)  Hematocrit 36.0 - 46.0 % 49.5(H) 47.8(H) 46.5(H)  Platelets 150 - 400 K/uL 171 154 197    . CMP Latest Ref Rng & Units 07/15/2020 01/15/2020 10/02/2018  Glucose 70 - 99 mg/dL 108(H) 93 106(H)  BUN 8 - 23 mg/dL 22 18 15   Creatinine 0.44 - 1.00 mg/dL 0.90 0.95 1.01(H)  Sodium 135 - 145 mmol/L 140 141 139  Potassium 3.5 - 5.1 mmol/L 4.0 4.3 3.9  Chloride 98 - 111 mmol/L 102 104 103  CO2 22 - 32 mmol/L 25 32 25  Calcium 8.9 - 10.3 mg/dL 9.7 10.0 9.8  Total Protein 6.5 - 8.1 g/dL 7.5 7.5 7.8  Total Bilirubin 0.3 - 1.2 mg/dL 0.4 0.4 0.3  Alkaline Phos 38 - 126 U/L 98 85 73  AST 15 - 41 U/L 14(L) 18 16  ALT 0 - 44 U/L 10 17 12    Component     Latest Ref Rng & Units 01/23/2018 01/24/2018  Iron     28 - 170 ug/dL 299 (H)   TIBC     250 - 450 ug/dL 335   Saturation Ratios     10.4 - 31.8 % 89 (H)   UIBC     ug/dL 36   Retic Ct Pct     0.4 - 3.1 % 2.4   RBC.     3.87 - 5.11 MIL/uL 1.31 (L)   Retic Count, Absolute     19.0 - 186.0 K/uL 31.7   Immature Retic Fract     2.3 - 15.9 % 12.3   Vitamin B12     180 - 914 pg/mL <50 (L)   Folate     >5.9 ng/mL 7.5   Ferritin     11 - 307 ng/mL 66   Intrinsic Factor     0.0 - 1.1 AU/mL  18.2 (H)  Parietal  Cell Antibody-IgG     0.0 - 20.0 Units  70.1 (H)   . Lab Results  Component Value Date   IRON 67 07/15/2020   TIBC 409 07/15/2020   IRONPCTSAT 16 (L) 07/15/2020   (Iron and TIBC)  Lab Results  Component Value Date   FERRITIN 14 07/15/2020   B12 --- 446  RADIOGRAPHIC STUDIES:  I have personally reviewed the radiological images as listed and agreed with the findings in the report. No results found.  ASSESSMENT & PLAN:   70 y.o. female with  1.  S/p Pancytopenia likely due to severe B12 deficiency- now resolved. Patient's labs upon initial presentation 01/23/18, Vitamin B12 low at <50. S/p blood transfusion the pt's HGB increased from 4.9 to 8.6.   Anemia a combination of pernicious anemia causing B12 and iron deficiency, and a concern of GI bleeding  2. Severe B12 deficiency due to pernicious anemia +ve parietal cell and Intrinsic factor antibodies. 01/24/18 anti-parietal antibody and intrinsic factor antibody are both elevated consistent with pernicious anemia.   3. H/o DVT rt posterior tibial veins. Likely triggering event - 2 packs/day of cigarette smoking.  01/23/18 VAS US revealed  Right: findings consistent with acute DVT involving the right posterior tibial vein  S/p 6 months of Xarelto Plan -conitnue ASA 81mg  po daily  4. Iron deficiency  4. Septated cyst in right kidney As seen on 01/24/18 CT A/P 01/01/2019 US Renal ([id number]) which revealed "Bilateral renal cysts. The largest cyst is on the right measuring up to 9.8 cm with septations and no definitive solid components. This is similar appearance to the CT scan from January 04, 2018. Three cysts in the left kidney." 12/12/2018 Mammography which revealed "There is no mammographic evidence of malignancy. Routine mammographic evaluation in 1 year is recommended (12/13/2019)." 12/21/2018 CT Chest/Lung ([id number]) which revealed "1. Lung-RADS 2, benign appearance or behavior. Continue annual screening with low-dose chest CT without contrast in 12 months. 2. Aortic atherosclerosis (ICD10-170.0). Coronary artery calcification. 3.  Emphysema (ICD10-J43.9)."  PLAN: -Discussed pt labwork today, 07/15/2020; blood counts normal.  -Recommended pt take no more than 325 mg Aspirin daily as this ceases to be a blood thinner above this dosage. -Recommended pt take at least 2000 IU Vitamin D daily. -Recommended pt use OTC Voltaren Gel for neck arthritis pain. -Recommended pt f/u w  PCP for pain management. -Continue monthly Vitamin B12 [place]. B12 levels WNL -Continue 81mg  Aspirin -Will see back as needed. The pt will get labs checked with PCP.   FOLLOW UP: RTC with PCP   The total time spent in the appt was 20 minutes and more than 50% was on counseling and direct patient cares.  All of the patient's questions were answered with apparent satisfaction. The patient knows to call the clinic with any problems, questions or concerns.    Sullivan Lone MD [place] AAHIVMS [facility] Hematology/Oncology Physician [facility]  (Office):       [phone] (Work cell):  [phone] (Fax):           [phone]  07/15/2020 11:19 AM  I, Reinaldo Raddle, am acting as scribe for Dr. Sullivan Lone, MD.   .I have reviewed the above documentation for accuracy and completeness, and I agree with the above. Brunetta Genera MD   ADDENDUM  Iron levels are gradually dropping again though no anemia. Given pernicious anemia -- might not absorb po iron -- will give patient option for 1 dose of IV Injectafer to build up  her iron stores.  Brunetta Genera MD

## 2020-07-15 ENCOUNTER — Inpatient Hospital Stay: Payer: Medicare Other | Attending: Hematology | Admitting: Hematology

## 2020-07-15 ENCOUNTER — Inpatient Hospital Stay: Payer: Medicare Other

## 2020-07-15 ENCOUNTER — Other Ambulatory Visit: Payer: Self-pay

## 2020-07-15 VITALS — BP 144/80 | HR 96 | Temp 98.4°F | Resp 17 | Wt 175.1 lb

## 2020-07-15 DIAGNOSIS — Z833 Family history of diabetes mellitus: Secondary | ICD-10-CM | POA: Insufficient documentation

## 2020-07-15 DIAGNOSIS — E538 Deficiency of other specified B group vitamins: Secondary | ICD-10-CM

## 2020-07-15 DIAGNOSIS — D510 Vitamin B12 deficiency anemia due to intrinsic factor deficiency: Secondary | ICD-10-CM

## 2020-07-15 DIAGNOSIS — Z7982 Long term (current) use of aspirin: Secondary | ICD-10-CM | POA: Insufficient documentation

## 2020-07-15 DIAGNOSIS — E611 Iron deficiency: Secondary | ICD-10-CM | POA: Insufficient documentation

## 2020-07-15 DIAGNOSIS — F17210 Nicotine dependence, cigarettes, uncomplicated: Secondary | ICD-10-CM | POA: Diagnosis not present

## 2020-07-15 DIAGNOSIS — Z8249 Family history of ischemic heart disease and other diseases of the circulatory system: Secondary | ICD-10-CM | POA: Insufficient documentation

## 2020-07-15 DIAGNOSIS — D509 Iron deficiency anemia, unspecified: Secondary | ICD-10-CM

## 2020-07-15 DIAGNOSIS — Z86718 Personal history of other venous thrombosis and embolism: Secondary | ICD-10-CM | POA: Diagnosis not present

## 2020-07-15 LAB — CBC WITH DIFFERENTIAL (CANCER CENTER ONLY)
Abs Immature Granulocytes: 0.02 10*3/uL (ref 0.00–0.07)
Basophils Absolute: 0.1 10*3/uL (ref 0.0–0.1)
Basophils Relative: 1 %
Eosinophils Absolute: 0.2 10*3/uL (ref 0.0–0.5)
Eosinophils Relative: 3 %
HCT: 49.5 % — ABNORMAL HIGH (ref 36.0–46.0)
Hemoglobin: 16.4 g/dL — ABNORMAL HIGH (ref 12.0–15.0)
Immature Granulocytes: 0 %
Lymphocytes Relative: 21 %
Lymphs Abs: 1.5 10*3/uL (ref 0.7–4.0)
MCH: 31.1 pg (ref 26.0–34.0)
MCHC: 33.1 g/dL (ref 30.0–36.0)
MCV: 93.9 fL (ref 80.0–100.0)
Monocytes Absolute: 0.6 10*3/uL (ref 0.1–1.0)
Monocytes Relative: 8 %
Neutro Abs: 4.9 10*3/uL (ref 1.7–7.7)
Neutrophils Relative %: 67 %
Platelet Count: 171 10*3/uL (ref 150–400)
RBC: 5.27 MIL/uL — ABNORMAL HIGH (ref 3.87–5.11)
RDW: 13.4 % (ref 11.5–15.5)
WBC Count: 7.1 10*3/uL (ref 4.0–10.5)
nRBC: 0 % (ref 0.0–0.2)

## 2020-07-15 LAB — CMP (CANCER CENTER ONLY)
ALT: 10 U/L (ref 0–44)
AST: 14 U/L — ABNORMAL LOW (ref 15–41)
Albumin: 3.7 g/dL (ref 3.5–5.0)
Alkaline Phosphatase: 98 U/L (ref 38–126)
Anion gap: 13 (ref 5–15)
BUN: 22 mg/dL (ref 8–23)
CO2: 25 mmol/L (ref 22–32)
Calcium: 9.7 mg/dL (ref 8.9–10.3)
Chloride: 102 mmol/L (ref 98–111)
Creatinine: 0.9 mg/dL (ref 0.44–1.00)
GFR, Estimated: >60 mL/min
Glucose, Bld: 108 mg/dL — ABNORMAL HIGH (ref 70–99)
Potassium: 4 mmol/L (ref 3.5–5.1)
Sodium: 140 mmol/L (ref 135–145)
Total Bilirubin: 0.4 mg/dL (ref 0.3–1.2)
Total Protein: 7.5 g/dL (ref 6.5–8.1)

## 2020-07-15 LAB — IRON AND TIBC
Iron: 67 ug/dL (ref 41–142)
Saturation Ratios: 16 % — ABNORMAL LOW (ref 21–57)
TIBC: 409 ug/dL (ref 236–444)
UIBC: 341 ug/dL (ref 120–384)

## 2020-07-15 LAB — FERRITIN: Ferritin: 14 ng/mL (ref 11–307)

## 2020-07-15 LAB — VITAMIN B12: Vitamin B-12: 446 pg/mL (ref 180–914)

## 2020-07-17 DIAGNOSIS — Z23 Encounter for immunization: Secondary | ICD-10-CM | POA: Diagnosis not present

## 2020-07-23 ENCOUNTER — Telehealth: Payer: Self-pay

## 2020-07-23 NOTE — Telephone Encounter (Signed)
Contacted pt to let her know:  1 dose of IV Injectafer to build up her iron stores. If she is agreeable to IV iron plz schedule this- orders are in. Will need labs and f/u with me in 10-11 months. thanks  Pt agreeable to IV iron. Informed pt she would be contacted by scheduling to set up iron infusion. Pt verbalized understanding.

## 2020-07-24 ENCOUNTER — Telehealth: Payer: Self-pay | Admitting: Hematology

## 2020-07-24 NOTE — Telephone Encounter (Signed)
Scheduled appts per 4/21 sch msg. Called pt, no answer. Left msg with appts dates and times.  

## 2020-08-12 ENCOUNTER — Other Ambulatory Visit: Payer: Self-pay

## 2020-08-12 ENCOUNTER — Inpatient Hospital Stay: Payer: Medicare Other | Attending: Hematology

## 2020-08-12 VITALS — BP 110/64 | HR 79 | Temp 97.7°F | Resp 16

## 2020-08-12 DIAGNOSIS — E611 Iron deficiency: Secondary | ICD-10-CM | POA: Insufficient documentation

## 2020-08-12 DIAGNOSIS — Z833 Family history of diabetes mellitus: Secondary | ICD-10-CM | POA: Diagnosis not present

## 2020-08-12 DIAGNOSIS — D510 Vitamin B12 deficiency anemia due to intrinsic factor deficiency: Secondary | ICD-10-CM | POA: Diagnosis not present

## 2020-08-12 DIAGNOSIS — Z86718 Personal history of other venous thrombosis and embolism: Secondary | ICD-10-CM | POA: Diagnosis not present

## 2020-08-12 DIAGNOSIS — Z7982 Long term (current) use of aspirin: Secondary | ICD-10-CM | POA: Diagnosis not present

## 2020-08-12 DIAGNOSIS — Z8249 Family history of ischemic heart disease and other diseases of the circulatory system: Secondary | ICD-10-CM | POA: Diagnosis not present

## 2020-08-12 DIAGNOSIS — F17210 Nicotine dependence, cigarettes, uncomplicated: Secondary | ICD-10-CM | POA: Diagnosis not present

## 2020-08-12 DIAGNOSIS — D509 Iron deficiency anemia, unspecified: Secondary | ICD-10-CM

## 2020-08-12 MED ORDER — ACETAMINOPHEN 325 MG PO TABS
650.0000 mg | ORAL_TABLET | Freq: Once | ORAL | Status: AC
  Administered 2020-08-12: 650 mg via ORAL

## 2020-08-12 MED ORDER — LORATADINE 10 MG PO TABS
10.0000 mg | ORAL_TABLET | Freq: Once | ORAL | Status: AC
  Administered 2020-08-12: 10 mg via ORAL

## 2020-08-12 MED ORDER — SODIUM CHLORIDE 0.9 % IV SOLN
750.0000 mg | Freq: Once | INTRAVENOUS | Status: AC
  Administered 2020-08-12: 750 mg via INTRAVENOUS
  Filled 2020-08-12: qty 15

## 2020-08-12 MED ORDER — ACETAMINOPHEN 325 MG PO TABS
ORAL_TABLET | ORAL | Status: AC
  Filled 2020-08-12: qty 2

## 2020-08-12 MED ORDER — SODIUM CHLORIDE 0.9 % IV SOLN
Freq: Once | INTRAVENOUS | Status: AC
  Filled 2020-08-12: qty 250

## 2020-08-12 MED ORDER — LORATADINE 10 MG PO TABS
ORAL_TABLET | ORAL | Status: AC
  Filled 2020-08-12: qty 1

## 2020-08-12 NOTE — Patient Instructions (Signed)
Ferric carboxymaltose injection What is this medicine? FERRIC CARBOXYMALTOSE (ferr-ik car-box-ee-mol-toes) is an iron complex. Iron is used to make healthy red blood cells, which carry oxygen and nutrients throughout the body. This medicine is used to treat anemia in people with chronic kidney disease or people who cannot take iron by mouth. This medicine may be used for other purposes; ask your health care provider or pharmacist if you have questions. COMMON BRAND NAME(S): Injectafer What should I tell my health care provider before I take this medicine? They need to know if you have any of these conditions:  high levels of iron in the blood  liver disease  an unusual or allergic reaction to iron, other medicines, foods, dyes, or preservatives  pregnant or trying to get pregnant  breast-feeding How should I use this medicine? This medicine is for infusion into a vein. It is given by a health care professional in a hospital or clinic setting. Talk to your pediatrician regarding the use of this medicine in children. Special care may be needed. Overdosage: If you think you have taken too much of this medicine contact a poison control center or emergency room at once. NOTE: This medicine is only for you. Do not share this medicine with others. What if I miss a dose? It is important not to miss your dose. Call your doctor or health care professional if you are unable to keep an appointment. What may interact with this medicine? Do not take this medicine with any of the following medications:  deferoxamine  dimercaprol  other iron products This list may not describe all possible interactions. Give your health care provider a list of all the medicines, herbs, non-prescription drugs, or dietary supplements you use. Also tell them if you smoke, drink alcohol, or use illegal drugs. Some items may interact with your medicine. What should I watch for while using this medicine? Visit your  doctor or health care professional regularly. Tell your doctor if your symptoms do not start to get better or if they get worse. You may need blood work done while you are taking this medicine. You may need to follow a special diet. Talk to your doctor. Foods that contain iron include: whole grains/cereals, dried fruits, beans, or peas, leafy green vegetables, and organ meats (liver, kidney). What side effects may I notice from receiving this medicine? Side effects that you should report to your doctor or health care professional as soon as possible:  allergic reactions like skin rash, itching or hives, swelling of the face, lips, or tongue  dizziness  facial flushing Side effects that usually do not require medical attention (report to your doctor or health care professional if they continue or are bothersome):  changes in taste  constipation  headache  nausea, vomiting  pain, redness, or irritation at site where injected This list may not describe all possible side effects. Call your doctor for medical advice about side effects. You may report side effects to FDA at 1-800-FDA-1088. Where should I keep my medicine? This drug is given in a hospital or clinic and will not be stored at home. NOTE: This sheet is a summary. It may not cover all possible information. If you have questions about this medicine, talk to your doctor, pharmacist, or health care provider.  2020 Elsevier/Gold Standard (2016-05-05 09:40:29)  Coronavirus (COVID-19) Are you at risk?  Are you at risk for the Coronavirus (COVID-19)?  To be considered HIGH RISK for Coronavirus (COVID-19), you have to meet the following   criteria:  . Traveled to [place], [place], [place], [place] or [place]; or in the [place] to [place], [place], [place], or [place]; and have fever, cough, and shortness of breath within the last 2 weeks of travel OR . Been in close contact with a person diagnosed with COVID-19 within the  last 2 weeks and have fever, cough, and shortness of breath . IF YOU DO NOT MEET THESE CRITERIA, YOU ARE CONSIDERED LOW RISK FOR COVID-19.  What to do if you are HIGH RISK for COVID-19?  . If you are having a medical emergency, call 911. . Seek medical care right away. Before you go to a doctor's office, urgent care or emergency department, call ahead and tell them about your recent travel, contact with someone diagnosed with COVID-19, and your symptoms. You should receive instructions from your physician's office regarding next steps of care.  . When you arrive at healthcare provider, tell the healthcare staff immediately you have returned from visiting [place], [place], [place], [place] or [place]; or traveled in the [place] to [place], [place], [place], or [place]; in the last two weeks or you have been in close contact with a person diagnosed with COVID-19 in the last 2 weeks.   . Tell the health care staff about your symptoms: fever, cough and shortness of breath. . After you have been seen by a medical provider, you will be either: o Tested for (COVID-19) and discharged home on quarantine except to seek medical care if symptoms worsen, and asked to  - Stay home and avoid contact with others until you get your results (4-5 days)  - Avoid travel on public transportation if possible (such as bus, train, or airplane) or o Sent to the Emergency Department by EMS for evaluation, COVID-19 testing, and possible admission depending on your condition and test results.  What to do if you are LOW RISK for COVID-19?  Reduce your risk of any infection by using the same precautions used for avoiding the common cold or flu:  . Wash your hands often with soap and warm water for at least 20 seconds.  If soap and water are not readily available, use an alcohol-based hand sanitizer with at least 60% alcohol.  . If coughing or sneezing, cover your mouth and nose by coughing or sneezing into the elbow  areas of your shirt or coat, into a tissue or into your sleeve (not your hands). . Avoid shaking hands with others and consider head nods or verbal greetings only. . Avoid touching your eyes, nose, or mouth with unwashed hands.  . Avoid close contact with people who are sick. . Avoid places or events with large numbers of people in one location, like concerts or sporting events. . Carefully consider travel plans you have or are making. . If you are planning any travel outside or inside the [place], visit the CDC's Travelers' Health webpage for the latest health notices. . If you have some symptoms but not all symptoms, continue to monitor at home and seek medical attention if your symptoms worsen. . If you are having a medical emergency, call 911.   ADDITIONAL HEALTHCARE OPTIONS FOR PATIENTS  [facility] / e-Visit: [email or web address]         [facility]: [phone]  [facility]: [phone]                   [facility]: [phone]   

## 2020-08-19 ENCOUNTER — Other Ambulatory Visit: Payer: Self-pay

## 2020-08-19 ENCOUNTER — Encounter (INDEPENDENT_AMBULATORY_CARE_PROVIDER_SITE_OTHER): Payer: Medicare Other | Admitting: Ophthalmology

## 2020-08-19 DIAGNOSIS — H43813 Vitreous degeneration, bilateral: Secondary | ICD-10-CM

## 2020-08-19 DIAGNOSIS — H318 Other specified disorders of choroid: Secondary | ICD-10-CM | POA: Diagnosis not present

## 2020-09-16 ENCOUNTER — Encounter: Payer: Self-pay | Admitting: Family Medicine

## 2020-09-16 ENCOUNTER — Ambulatory Visit (INDEPENDENT_AMBULATORY_CARE_PROVIDER_SITE_OTHER): Payer: Medicare Other

## 2020-09-16 ENCOUNTER — Other Ambulatory Visit: Payer: Self-pay

## 2020-09-16 ENCOUNTER — Ambulatory Visit (INDEPENDENT_AMBULATORY_CARE_PROVIDER_SITE_OTHER): Payer: Medicare Other | Admitting: Family Medicine

## 2020-09-16 VITALS — BP 129/77 | HR 83 | Temp 98.3°F | Ht 66.0 in | Wt 176.6 lb

## 2020-09-16 DIAGNOSIS — M1711 Unilateral primary osteoarthritis, right knee: Secondary | ICD-10-CM | POA: Diagnosis not present

## 2020-09-16 DIAGNOSIS — M25461 Effusion, right knee: Secondary | ICD-10-CM | POA: Diagnosis not present

## 2020-09-16 DIAGNOSIS — M25361 Other instability, right knee: Secondary | ICD-10-CM | POA: Diagnosis not present

## 2020-09-16 NOTE — Progress Notes (Signed)
error 

## 2020-09-16 NOTE — Progress Notes (Signed)
Subjective  CC:  Chief Complaint  Patient presents with   Knee Injury    States knee gave out on her Sunday 09/13/2020. Denies any pain, right knee   Same day acute visit; PCP not available. New pt to me. Chart reviewed.   HPI: Jodi Myers is a 70 y.o. female who presents to the office today to address the problems listed above in the chief complaint. 70 year old female reports she was stepping down and her right knee gave way causing her to fall and land on her left knee.  She denies any pain.  Afterward, her gait was normal.  She denies other injuries except an abrasion to the left knee.  However, she like to know when this happened and how to prevent it happening again.  She has never had right knee pain or knee problems.  She denies history of locking or giving way.  No recent injuries.  When pressed, she does realize she has been favoring that knee while going down steps over the last 2 weeks.  She is done this because she had a sensation that it would give way.  Assessment  1. Knee gives way, right      Plan  Fall, right knee giving way: Exam consistent with osteoarthritic changes only today.  No clear internal derangement noted on exam.  Gait is normal.  We discussed possible etiologies including ligament tear, meniscus injury or other musculoskeletal problem like weakness of the quads that caused her fall.  Recommend wearing a knee support to see if that helps.  Recommend being cautious when going up and down stairs using railing to avoid falls in the future.  If the symptoms persist or worsen, then would follow-up with PCP or sports medicine for further evaluation and possible imaging.  At this time, given this was her first instance and without pain or identifiable trigger, we both agree we will monitor.  We will check x-ray to look for effusion.  Follow up: As needed Visit date not found  Orders Placed This Encounter  Procedures   DG Knee Complete 4 Views Right   No orders  of the defined types were placed in this encounter.     I reviewed the patients updated PMH, FH, and SocHx.    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   albuterol (VENTOLIN HFA) 108 (90 Base) MCG/ACT inhaler Inhale 2 puffs into the lungs every 6 (six) hours as needed for wheezing or shortness of breath.   ASPIRIN ADULT LOW STRENGTH 81 MG EC tablet TAKE 1 TABLET BY MOUTH EVERY DAY   BESIVANCE 0.6 % SUSP Place 1 drop into the left eye See admin instructions. Instill 1 drop into left eye 4 times a day for 2 days after each monthly eye injection   CALCIUM PO Take 1 tablet by mouth daily.   cyanocobalamin (,VITAMIN B-12,) 1000 MCG/ML injection INJECT 1 ML (1,000 MCG  TOTAL) INTO THE MUSCLE EVERY 30 (THIRTY) DAYS   pantoprazole (PROTONIX) 40 MG tablet TAKE 1 TABLET BY MOUTH EVERY DAY   VITAMIN D PO Take 1 tablet by mouth daily.    Allergies: Patient is allergic to mango flavor, poison ivy extract, and sporanox [itraconazole]. Family History: Patient family history includes CVA in her mother; Diabetes Mellitus I in her sister; Diabetes Mellitus II in her mother; Hashimoto's thyroiditis in her sister; Heart attack in her father; Rheum arthritis in her mother. Social History:  Patient  reports that she has been smoking cigarettes. She has a 73.50 pack-year smoking history. She has never used smokeless tobacco. She reports current alcohol use of about 7.0  standard drinks of alcohol per week. She reports that she does not use drugs.  Review of Systems: Constitutional: Negative for fever malaise or anorexia Cardiovascular: negative for chest pain Respiratory: negative for SOB or persistent cough Gastrointestinal: negative for abdominal pain  Objective  Vitals: BP 129/77   Pulse 83   Temp 98.3 F (36.8 C) (Temporal)   Ht 5\' 6"  (1.676 m)   Wt 176 lb 9.6 oz (80.1 kg)   SpO2 95%   BMI 28.50 kg/m  General: no acute distress , A&Ox3 She is able to stand from a seated position unassisted.  Gait is normal.  Able to get to table without assistance Right knee: Osteoarthritic changes present without tenderness, joint line tenderness, laxity.  Negative McMurray's, negative Lachman's.    Commons side effects, risks, benefits, and alternatives for medications and treatment plan prescribed today were discussed, and the patient expressed understanding of the given instructions. Patient is instructed to call or message via MyChart if he/she has any questions or concerns regarding our treatment plan. No barriers to understanding were identified. We discussed Red Flag symptoms and signs in detail. Patient expressed understanding regarding what to do in case of urgent or emergency type symptoms.  Medication list was reconciled, printed and provided to the patient in AVS. Patient instructions and summary information was reviewed with the patient as documented in the AVS. This note was prepared with assistance of Dragon voice recognition software. Occasional wrong-word or sound-a-like substitutions may have occurred due to the inherent limitations of voice recognition software  This visit occurred during the SARS-CoV-2 public health emergency.  Safety protocols were in place, including screening questions prior to the visit, additional usage of staff PPE, and extensive cleaning of exam room while observing appropriate contact time as indicated for disinfecting  solutions.

## 2020-09-16 NOTE — Patient Instructions (Signed)
Please follow up if symptoms do not improve or as needed.    Be cautious to avoid falling. Pay attention to knee symptoms and return to see Aldona Bar if they persist or worsen.  Try wearing a Knee support to see if that makes you feel more stable.   We will let you know your xray results.

## 2020-10-12 DIAGNOSIS — B394 Histoplasmosis capsulati, unspecified: Secondary | ICD-10-CM | POA: Diagnosis not present

## 2020-10-12 DIAGNOSIS — Z961 Presence of intraocular lens: Secondary | ICD-10-CM | POA: Diagnosis not present

## 2020-10-14 ENCOUNTER — Other Ambulatory Visit: Payer: Self-pay

## 2020-10-14 ENCOUNTER — Encounter (INDEPENDENT_AMBULATORY_CARE_PROVIDER_SITE_OTHER): Payer: Medicare Other | Admitting: Ophthalmology

## 2020-10-14 DIAGNOSIS — H318 Other specified disorders of choroid: Secondary | ICD-10-CM

## 2020-10-14 DIAGNOSIS — H43813 Vitreous degeneration, bilateral: Secondary | ICD-10-CM | POA: Diagnosis not present

## 2020-11-07 ENCOUNTER — Other Ambulatory Visit: Payer: Self-pay | Admitting: Physician Assistant

## 2020-12-02 ENCOUNTER — Encounter (INDEPENDENT_AMBULATORY_CARE_PROVIDER_SITE_OTHER): Payer: Medicare Other | Admitting: Ophthalmology

## 2020-12-02 ENCOUNTER — Other Ambulatory Visit: Payer: Self-pay

## 2020-12-02 DIAGNOSIS — B399 Histoplasmosis, unspecified: Secondary | ICD-10-CM

## 2020-12-02 DIAGNOSIS — H318 Other specified disorders of choroid: Secondary | ICD-10-CM

## 2020-12-02 DIAGNOSIS — H43813 Vitreous degeneration, bilateral: Secondary | ICD-10-CM | POA: Diagnosis not present

## 2021-01-01 IMAGING — US US RENAL
1 series · 14 of 25 positions shown · non-contrast
Comparison: CT scan January 24, 2018

CLINICAL DATA: Known renal cysts.

EXAM:
RENAL / URINARY TRACT ULTRASOUND COMPLETE

[Series 1: us renal · 14 of 58 slices shown]
[im 1/58]
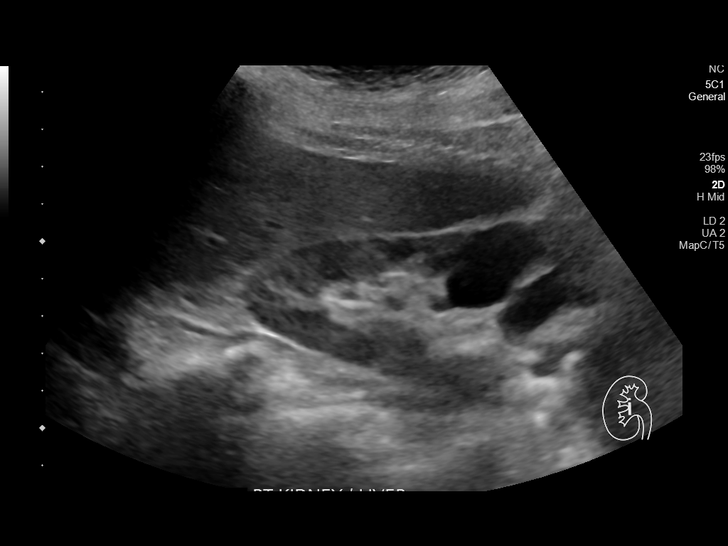
[im 5/58]
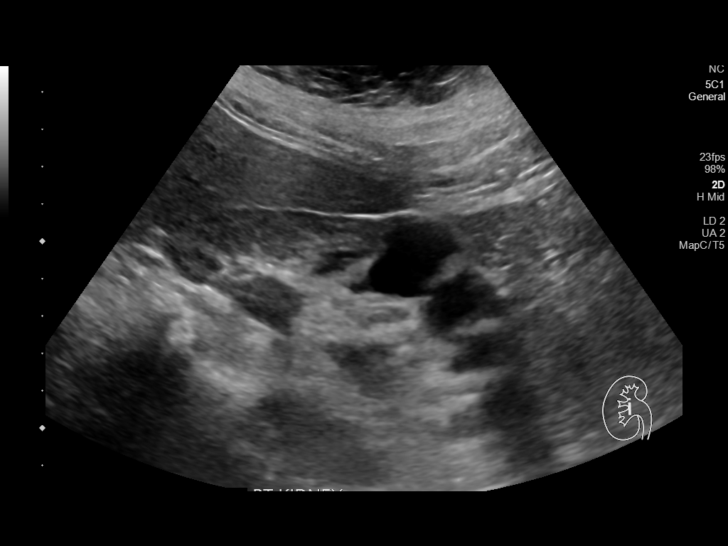
[im 10/58]
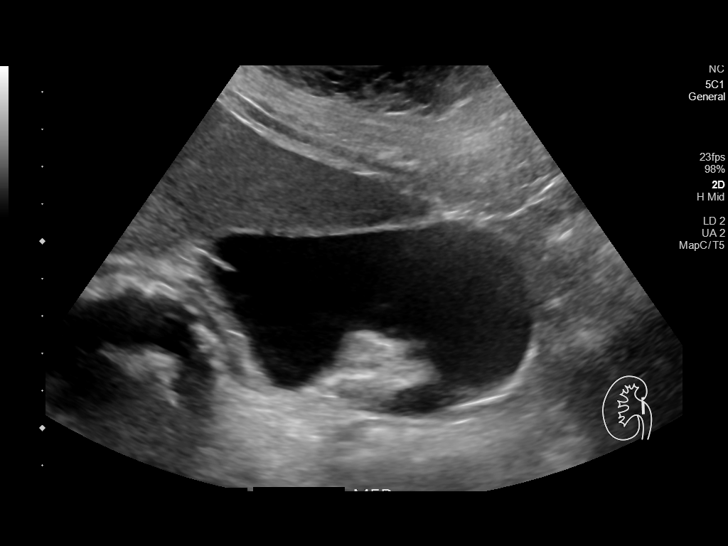
[im 15/58]
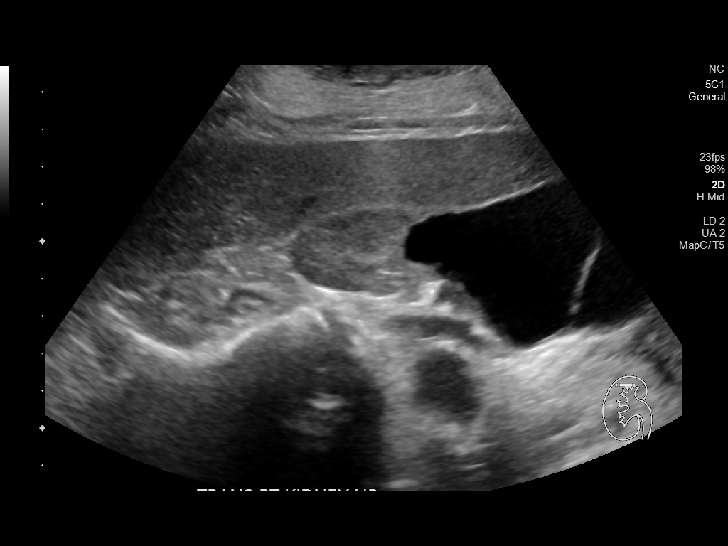
[im 20/58]
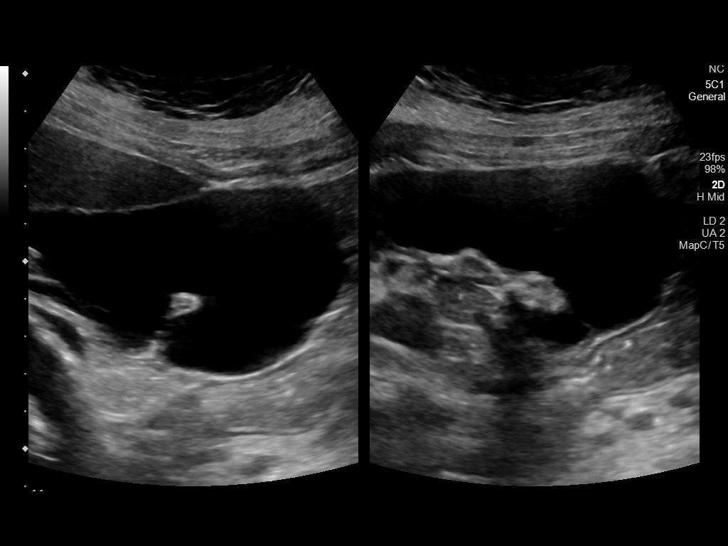
[im 22/58]
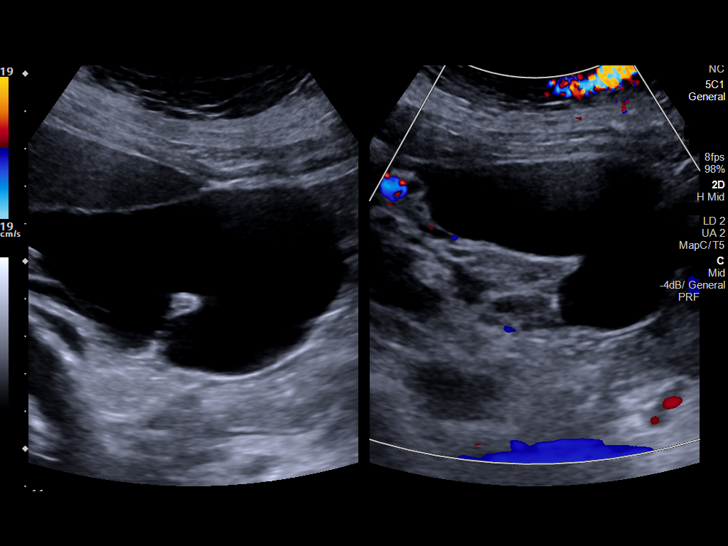
[im 27/58]
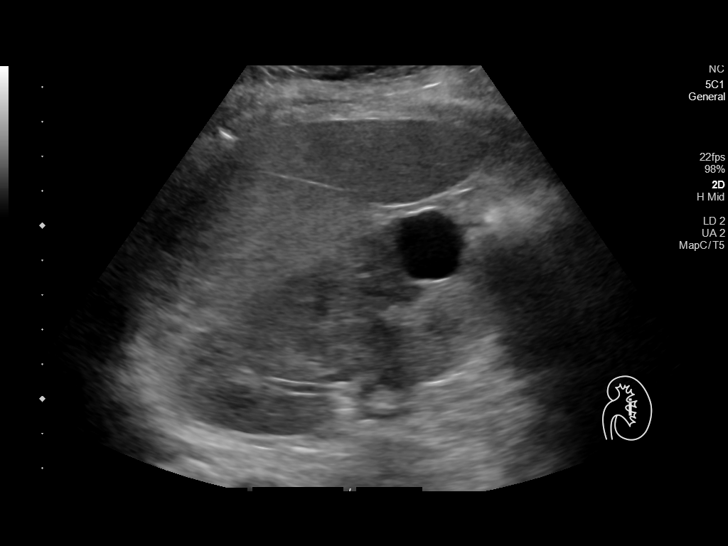
[im 31/58]
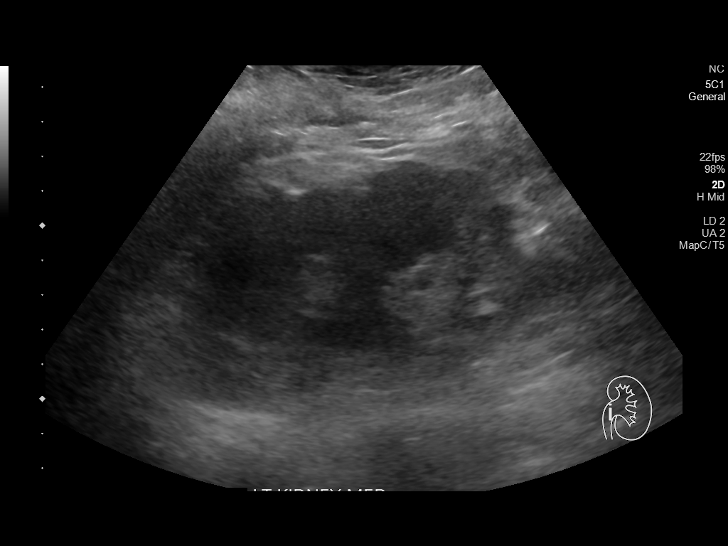
[im 36/58]
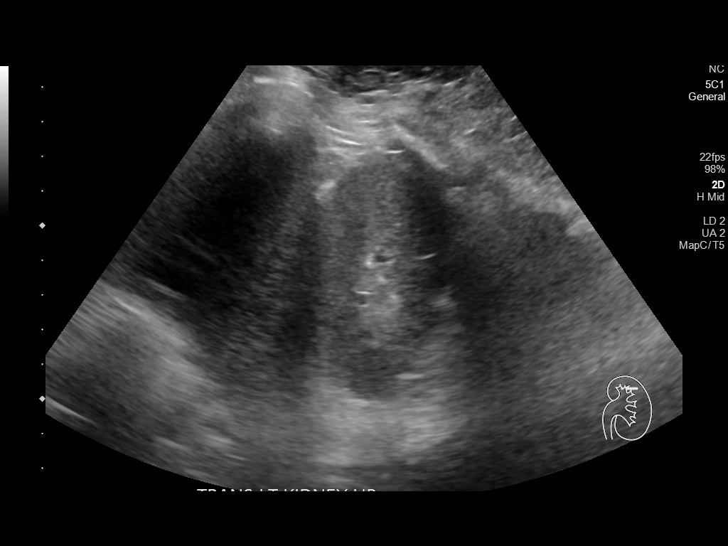
[im 39/58]
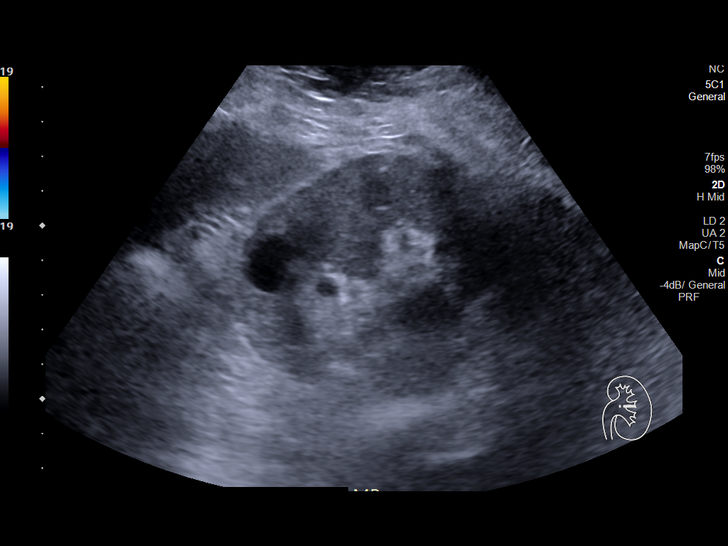
[im 43/58]
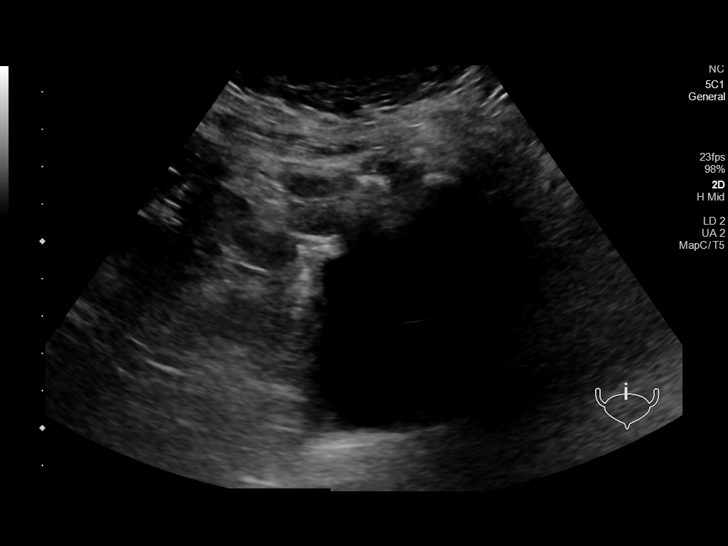
[im 48/58]
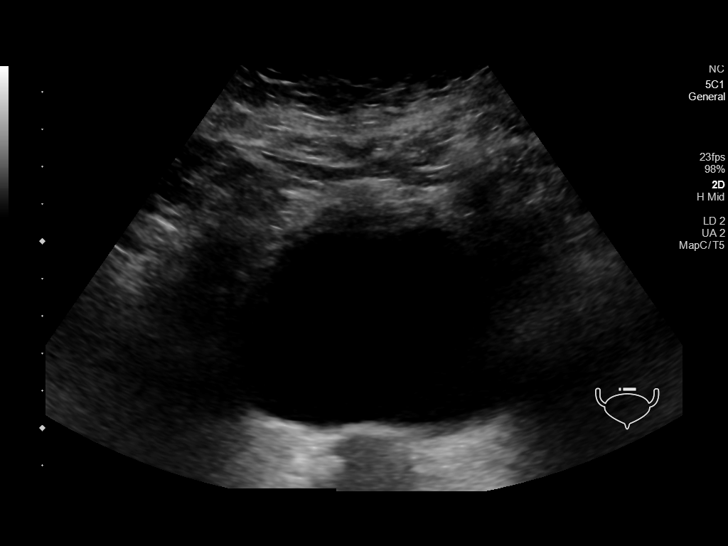
[im 53/58]
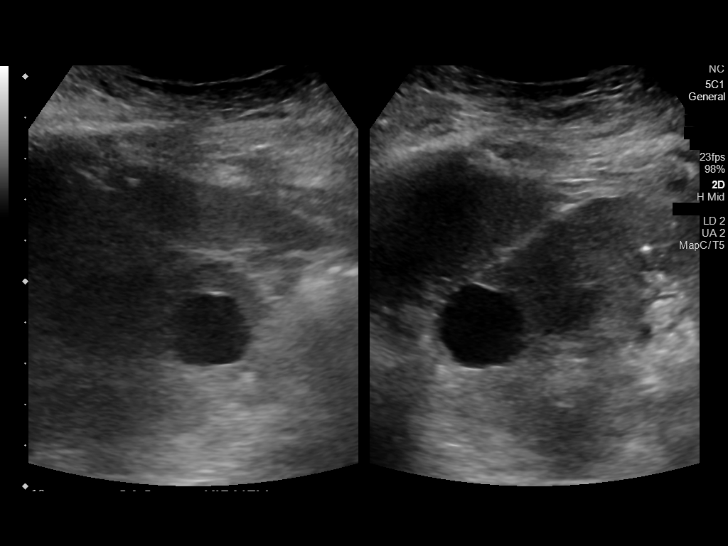
[im 58/58]
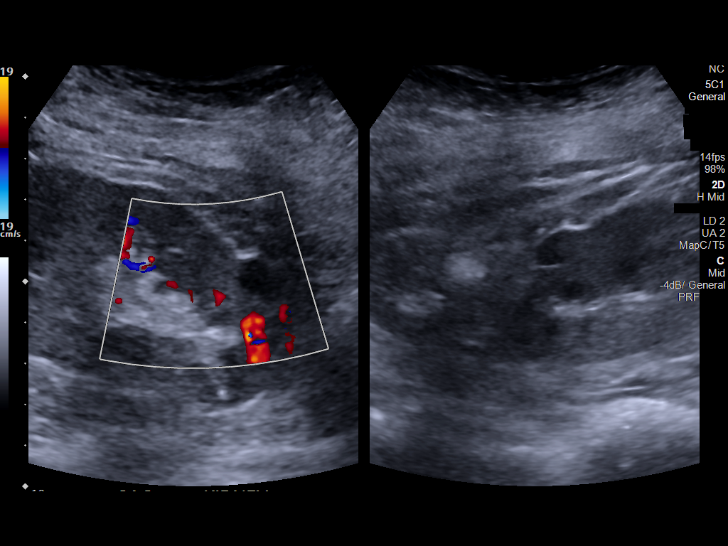

[14 of 25 positions shown; findings below may reference images not displayed]

FINDINGS: Right Kidney:

Renal measurements: 9.8 x 4.7 x 3.5 cm = volume: 83.1 mL. The
patient has known septated cyst in the upper right kidney is again
identified. By my measurement, the maximum dimension on the previous
study was 9.4 cm and there is not been significant change given
difference in technique.

Left Kidney:

Renal measurements: 12.5 x 7.3 x 5.1 cm = volume: 241.8 mL. Three
cysts are seen in the left kidney with the largest measuring 2.1 cm
in the midpole.

Bladder:

Appears normal for degree of bladder distention.
IMPRESSION: Bilateral renal cysts. The largest cyst is on the right measuring up
to 9.8 cm with septations and no definitive solid components. This
is similar appearance to the CT scan from January 04, 2018. Three
cysts in the left kidney.

## 2021-01-07 DIAGNOSIS — Z23 Encounter for immunization: Secondary | ICD-10-CM | POA: Diagnosis not present

## 2021-01-27 ENCOUNTER — Encounter (INDEPENDENT_AMBULATORY_CARE_PROVIDER_SITE_OTHER): Payer: Medicare Other | Admitting: Ophthalmology

## 2021-01-27 ENCOUNTER — Other Ambulatory Visit: Payer: Self-pay

## 2021-01-27 DIAGNOSIS — H318 Other specified disorders of choroid: Secondary | ICD-10-CM | POA: Diagnosis not present

## 2021-01-27 DIAGNOSIS — B399 Histoplasmosis, unspecified: Secondary | ICD-10-CM | POA: Diagnosis not present

## 2021-01-27 DIAGNOSIS — H43813 Vitreous degeneration, bilateral: Secondary | ICD-10-CM

## 2021-01-27 DIAGNOSIS — H32 Chorioretinal disorders in diseases classified elsewhere: Secondary | ICD-10-CM | POA: Diagnosis not present

## 2021-02-01 DIAGNOSIS — Z1231 Encounter for screening mammogram for malignant neoplasm of breast: Secondary | ICD-10-CM | POA: Diagnosis not present

## 2021-02-01 LAB — HM MAMMOGRAPHY

## 2021-02-09 ENCOUNTER — Other Ambulatory Visit: Payer: Self-pay

## 2021-02-09 ENCOUNTER — Other Ambulatory Visit: Payer: Self-pay | Admitting: Physician Assistant

## 2021-02-09 DIAGNOSIS — F17210 Nicotine dependence, cigarettes, uncomplicated: Secondary | ICD-10-CM

## 2021-02-09 DIAGNOSIS — Z87891 Personal history of nicotine dependence: Secondary | ICD-10-CM

## 2021-02-10 ENCOUNTER — Other Ambulatory Visit: Payer: Self-pay | Admitting: Physician Assistant

## 2021-02-17 ENCOUNTER — Encounter: Payer: Self-pay | Admitting: Physician Assistant

## 2021-03-09 ENCOUNTER — Ambulatory Visit
Admission: RE | Admit: 2021-03-09 | Discharge: 2021-03-09 | Disposition: A | Discharge status: Home / Self Care | Payer: Medicare Other | Source: Ambulatory Visit | Attending: Acute Care | Admitting: Acute Care

## 2021-03-09 DIAGNOSIS — F17210 Nicotine dependence, cigarettes, uncomplicated: Secondary | ICD-10-CM

## 2021-03-09 DIAGNOSIS — Z87891 Personal history of nicotine dependence: Secondary | ICD-10-CM

## 2021-03-11 ENCOUNTER — Other Ambulatory Visit: Payer: Self-pay | Admitting: Acute Care

## 2021-03-11 DIAGNOSIS — F17210 Nicotine dependence, cigarettes, uncomplicated: Secondary | ICD-10-CM

## 2021-03-11 DIAGNOSIS — Z87891 Personal history of nicotine dependence: Secondary | ICD-10-CM

## 2021-03-24 ENCOUNTER — Other Ambulatory Visit: Payer: Self-pay

## 2021-03-24 ENCOUNTER — Encounter (INDEPENDENT_AMBULATORY_CARE_PROVIDER_SITE_OTHER): Payer: Medicare Other | Admitting: Ophthalmology

## 2021-03-24 DIAGNOSIS — B399 Histoplasmosis, unspecified: Secondary | ICD-10-CM

## 2021-03-24 DIAGNOSIS — H43813 Vitreous degeneration, bilateral: Secondary | ICD-10-CM

## 2021-03-24 DIAGNOSIS — H318 Other specified disorders of choroid: Secondary | ICD-10-CM

## 2021-04-08 ENCOUNTER — Other Ambulatory Visit: Payer: Self-pay | Admitting: Hematology

## 2021-04-12 DIAGNOSIS — U071 COVID-19: Secondary | ICD-10-CM | POA: Diagnosis not present

## 2021-04-19 ENCOUNTER — Ambulatory Visit: Payer: Medicare Other

## 2021-04-20 ENCOUNTER — Ambulatory Visit (INDEPENDENT_AMBULATORY_CARE_PROVIDER_SITE_OTHER): Payer: Medicare Other

## 2021-04-20 ENCOUNTER — Other Ambulatory Visit: Payer: Self-pay

## 2021-04-20 DIAGNOSIS — Z0000 Encounter for general adult medical examination without abnormal findings: Secondary | ICD-10-CM | POA: Diagnosis not present

## 2021-04-20 NOTE — Progress Notes (Addendum)
Virtual Visit via Telephone Note  I connected with  Jodi Myers on 04/20/21 at  8:45 AM EST by telephone and verified that I am speaking with the correct person using two identifiers.  Medicare Annual Wellness visit completed telephonically due to Covid-19 pandemic.   Persons participating in this call: This Health Coach and this patient.   Location: Patient: Home Provider: Office   I discussed the limitations, risks, security and privacy concerns of performing an evaluation and management service by telephone and the availability of in person appointments. The patient expressed understanding and agreed to proceed.  Unable to perform video visit due to video visit attempted and failed and/or patient does not have video capability.   Some vital signs may be absent or patient reported.   [name], LPN   Subjective:   Jodi Myers is a 71 y.o. female who presents for Medicare Annual (Subsequent) preventive examination.  Review of Systems     Cardiac Risk Factors include: advanced age (>70men, >59 women)     Objective:    There were no vitals filed for this visit. There is no height or weight on file to calculate BMI.  Advanced Directives 04/20/2021 08/12/2020 04/13/2020 07/16/2019 11/12/2018 07/18/2018 07/17/2018  Does Patient Have a Medical Advance Directive? Yes No;Yes Yes Yes Yes Yes Yes  Type of Paramedic of [place];Living will [place];Living will [place];Living will [place];Living will [place];Living will [place];Living will  Does patient want to make changes to medical advance directive? - No - Patient declined - No - Patient declined No - Patient declined No - Patient declined -  Copy of [place] in Chart? No - copy requested No - copy requested No - copy requested No - copy requested No - copy  requested No - copy requested No - copy requested  Would patient like information on creating a medical advance directive? - No - Patient declined - No - Patient declined - No - Patient declined Yes (ED - Information included in AVS)    Current Medications (verified) Outpatient Encounter Medications as of 04/20/2021  Medication Sig   acetaminophen (TYLENOL) 325 MG tablet Take 2 tablets (650 mg total) by mouth every 6 (six) hours as needed for mild pain (or Fever >/= 101).   albuterol (VENTOLIN HFA) 108 (90 Base) MCG/ACT inhaler Inhale 2 puffs into the lungs every 6 (six) hours as needed for wheezing or shortness of breath.   ASPIRIN LOW DOSE 81 MG EC tablet TAKE 1 TABLET BY MOUTH EVERY DAY   BESIVANCE 0.6 % SUSP Place 1 drop into the left eye See admin instructions. Instill 1 drop into left eye 4 times a day for 2 days after each monthly eye injection   CALCIUM PO Take 1 tablet by mouth daily.   cyanocobalamin (,VITAMIN B-12,) 1000 MCG/ML injection INJECT 1 ML (1,000 MCG TOTAL) INTO THE MUSCLE EVERY 30 DAYS.   pantoprazole (PROTONIX) 40 MG tablet TAKE 1 TABLET BY MOUTH EVERY DAY   VITAMIN D PO Take 1 tablet by mouth daily.   No facility-administered encounter medications on file as of 04/20/2021.    Allergies (verified) Mango flavor, Poison ivy extract, and Sporanox [itraconazole]   History: Past Medical History:  Diagnosis Date   Anemia 07/2018   bloood transfusion administered    Coronary artery disease    Past Surgical History:  Procedure Laterality Date  TONSILLECTOMY     Family History  Problem Relation Age of Onset   Diabetes Mellitus II Mother    Rheum arthritis Mother    CVA Mother    Diabetes Mellitus I Sister    Hashimoto's thyroiditis Sister    Heart attack Father    Colon cancer Neg Hx    Liver disease Neg Hx    Social History   Socioeconomic History   Marital status: Married    Spouse name: Not on file   Number of children: Not on file   Years of  education: Not on file   Highest education level: Not on file  Occupational History   Occupation: retired  Tobacco Use   Smoking status: Every Day    Packs/day: 1.50    Years: 49.00    Pack years: 73.50    Types: Cigarettes   Smokeless tobacco: Never  Vaping Use   Vaping Use: Never used  Substance and Sexual Activity   Alcohol use: Yes    Alcohol/week: 7.0 standard drinks    Types: 7 Cans of beer per week    Comment: a beer a day   Drug use: Never   Sexual activity: Not on file  Other Topics Concern   Not on file  Social History Narrative   Not on file   Social Determinants of Health   Financial Resource Strain: Low Risk    Difficulty of Paying Living Expenses: Not hard at all  Food Insecurity: No Food Insecurity   Worried About Charity fundraiser in the Last Year: Never true   [place] in the Last Year: Never true  Transportation Needs: No Transportation Needs   Lack of Transportation (Medical): No   Lack of Transportation (Non-Medical): No  Physical Activity: Inactive   Days of Exercise per Week: 0 days   Minutes of Exercise per Session: 0 min  Stress: No Stress Concern Present   Feeling of Stress : Not at all  Social Connections: Moderately Isolated   Frequency of Communication with Friends and Family: More than three times a week   Frequency of Social Gatherings with Friends and Family: More than three times a week   Attends Religious Services: Never   Marine scientist or Organizations: No   Attends Music therapist: Never   Marital Status: Married    Tobacco Counseling Ready to quit: Not Answered Counseling given: Not Answered   Clinical Intake:  Pre-visit preparation completed: Yes        BMI - recorded: 28.52 Nutritional Status: BMI 25 -29 Overweight Nutritional Risks: None Diabetes: No  How often do you need to have someone help you when you read instructions, pamphlets, or other written materials from your doctor  or pharmacy?: 1 - Never  Diabetic?No  Interpreter Needed?: No  Information entered by :: Charlott Rakes, LPN   Activities of Daily Living In your present state of health, do you have any difficulty performing the following activities: 04/20/2021  Hearing? N  Vision? N  Difficulty concentrating or making decisions? N  Walking or climbing stairs? N  Dressing or bathing? N  Doing errands, shopping? N  Preparing Food and eating ? N  Using the Toilet? N  In the past six months, have you accidently leaked urine? N  Do you have problems with loss of bowel control? N  Managing your Medications? N  Managing your Finances? N  Housekeeping or managing your Housekeeping? N  Some recent data might  be hidden    Patient Care Team: Inda Coke, [place] as PCP - General (Physician Assistant) Hayden Pedro, MD as Consulting Physician (Ophthalmology) Brunetta Genera, MD as Consulting Physician (Hematology)  Indicate any recent Medical Services you may have received from other than [facility] providers in the past year (date may be approximate).     Assessment:   This is a routine wellness examination for Jodi Myers.  Hearing/Vision screen Hearing Screening - Comments:: Pt denies any hearing issues  Vision Screening - Comments:: Pt follows up with  Dr Zigmund Daniel every 2 months   Dietary issues and exercise activities discussed: Current Exercise Habits: The patient does not participate in regular exercise at present   Goals Addressed             This Visit's Progress    Patient Stated       None at this time        Depression Screen PHQ 2/9 Scores 04/20/2021 04/13/2020  PHQ - 2 Score 0 0    Fall Risk Fall Risk  04/20/2021 04/13/2020 07/11/2019 11/12/2018  Falls in the past year? 1 0 0 0  Number falls in past yr: 1 0 0 0  Injury with Fall? 1 0 0 0  Comment knee - - -  Risk for fall due to : Impaired vision Impaired vision - -  Risk for fall due to: Comment - just had catarct surgery  and wears reading glasses - -  Follow up Falls prevention discussed Falls prevention discussed - Falls prevention discussed    FALL RISK PREVENTION PERTAINING TO THE HOME:  Any stairs in or around the home? Yes  If so, are there any without handrails? No  Home free of loose throw rugs in walkways, pet beds, electrical cords, etc? Yes  Adequate lighting in your home to reduce risk of falls? Yes   ASSISTIVE DEVICES UTILIZED TO PREVENT FALLS:  Life alert? No  Use of a cane, walker or w/c? No  Grab bars in the bathroom? No  Shower chair or bench in shower? No  Elevated toilet seat or a handicapped toilet? No   TIMED UP AND GO:  Was the test performed? No .   Cognitive Function:     6CIT Screen 04/20/2021 04/13/2020  What Year? 0 points 0 points  What month? 0 points 0 points  What time? 0 points -  Count back from 20 0 points 0 points  Months in reverse 0 points 0 points  Repeat phrase 0 points 0 points  Total Score 0 -    Immunizations Immunization History  Administered Date(s) Administered   Influenza, High Dose Seasonal PF 01/19/2017, 11/17/2018, 01/07/2021   Influenza,inj,Quad PF,6+ Mos 12/21/2019   Influenza-Unspecified 01/28/2014   PFIZER(Purple Top)SARS-COV-2 Vaccination 05/02/2019, 05/30/2019, 01/02/2020, 07/17/2020   Pfizer Covid-19 Vaccine Bivalent Booster 55yrs & up 01/12/2021   Pneumococcal Conjugate-13 06/29/2015   Pneumococcal Polysaccharide-23 08/01/2016   Tdap 06/29/2015   Zoster Recombinat (Shingrix) 11/19/2018, 02/25/2019    TDAP status: Up to date  Flu Vaccine status: Up to date  Pneumococcal vaccine status: Up to date  Covid-19 vaccine status: Completed vaccines  Qualifies for Shingles Vaccine? Yes   Zostavax completed Yes   Shingrix Completed?: Yes  Screening Tests Health Maintenance  Topic Date Due   Hepatitis C Screening  Never done   MAMMOGRAM  02/02/2023   TETANUS/TDAP  06/28/2025   Pneumonia Vaccine 4+ Years old  Completed    INFLUENZA VACCINE  Completed   DEXA  SCAN  Completed   COVID-19 Vaccine  Completed   Zoster Vaccines- Shingrix  Completed   HPV VACCINES  Aged Out   COLONOSCOPY (Pts 45-66yrs Insurance coverage will need to be confirmed)  Discontinued    Health Maintenance  Health Maintenance Due  Topic Date Due   Hepatitis C Screening  Never done    Colorectal cancer screening: No longer required.   Mammogram status: Completed 02/01/21. Repeat every year  Bone Density status: Completed 12/06/17. Results reflect: Bone density results: OSTEOPOROSIS. Repeat every 2 years.   Additional Screening:  Hepatitis C Screening: does qualify  Vision Screening: Recommended annual ophthalmology exams for early detection of glaucoma and other disorders of the eye. Is the patient up to date with their annual eye exam?  Yes  Who is the provider or what is the name of the office in which the patient attends annual eye exams? Dr Zigmund Daniel every 2 months If pt is not established with a provider, would they like to be referred to a provider to establish care? No .   Dental Screening: Recommended annual dental exams for proper oral hygiene  Community Resource Referral / Chronic Care Management: CRR required this visit?  No   CCM required this visit?  No      Plan:     I have personally reviewed and noted the following in the patients chart:   Medical and social history Use of alcohol, tobacco or illicit drugs  Current medications and supplements including opioid prescriptions.  Functional ability and status Nutritional status Physical activity Advanced directives List of other physicians Hospitalizations, surgeries, and ER visits in previous 12 months Vitals Screenings to include cognitive, depression, and falls Referrals and appointments  In addition, I have reviewed and discussed with patient certain preventive protocols, quality metrics, and best practice recommendations. A written personalized  care plan for preventive services as well as general preventive health recommendations were provided to patient.     [name], LPN   [id number]   Nurse Notes: None

## 2021-04-20 NOTE — Patient Instructions (Addendum)
Ms. Stegeman , Thank you for taking time to come for your Medicare Wellness Visit. I appreciate your ongoing commitment to your health goals. Please review the following plan we discussed and let me know if I can assist you in the future.   Screening recommendations/referrals: Colonoscopy: No longer required  Mammogram: Done 02/01/21 repeat every 2 years Bone Density: Done 12/06/17 repeat every 2 years  Recommended yearly ophthalmology/optometry visit for glaucoma screening and checkup Recommended yearly dental visit for hygiene and checkup  Vaccinations: Influenza vaccine: Done 01/07/21 repeat every year  Pneumococcal vaccine: Up to date Tdap vaccine: Done 06/29/15 repeat every 10 years  Shingles vaccine: Completed 8/17, 02/25/19   Covid-19:Completed 1/28, 2/25, 01/02/20 & 07/17/20  Advanced directives: Please bring a copy of your health care power of attorney and living will to the office at your convenience.  Conditions/risks identified: None at this time  Next appointment: Follow up in one year for your annual wellness visit    Preventive Care 65 Years and Older, Female Preventive care refers to lifestyle choices and visits with your health care provider that can promote health and wellness. What does preventive care include? A yearly physical exam. This is also called an annual well check. Dental exams once or twice a year. Routine eye exams. Ask your health care provider how often you should have your eyes checked. Personal lifestyle choices, including: Daily care of your teeth and gums. Regular physical activity. Eating a healthy diet. Avoiding tobacco and drug use. Limiting alcohol use. Practicing safe sex. Taking low-dose aspirin every day. Taking vitamin and mineral supplements as recommended by your health care provider. What happens during an annual well check? The services and screenings done by your health care provider during your annual well check will depend on your  age, overall health, lifestyle risk factors, and family history of disease. Counseling  Your health care provider may ask you questions about your: Alcohol use. Tobacco use. Drug use. Emotional well-being. Home and relationship well-being. Sexual activity. Eating habits. History of falls. Memory and ability to understand (cognition). Work and work Statistician. Reproductive health. Screening  You may have the following tests or measurements: Height, weight, and BMI. Blood pressure. Lipid and cholesterol levels. These may be checked every 5 years, or more frequently if you are over 21 years old. Skin check. Lung cancer screening. You may have this screening every year starting at age 22 if you have a 30-pack-year history of smoking and currently smoke or have quit within the past 15 years. Fecal occult blood test (FOBT) of the stool. You may have this test every year starting at age 70. Flexible sigmoidoscopy or colonoscopy. You may have a sigmoidoscopy every 5 years or a colonoscopy every 10 years starting at age 31. Hepatitis C blood test. Hepatitis B blood test. Sexually transmitted disease (STD) testing. Diabetes screening. This is done by checking your blood sugar (glucose) after you have not eaten for a while (fasting). You may have this done every 1-3 years. Bone density scan. This is done to screen for osteoporosis. You may have this done starting at age 78. Mammogram. This may be done every 1-2 years. Talk to your health care provider about how often you should have regular mammograms. Talk with your health care provider about your test results, treatment options, and if necessary, the need for more tests. Vaccines  Your health care provider may recommend certain vaccines, such as: Influenza vaccine. This is recommended every year. Tetanus, diphtheria, and acellular pertussis (  Tdap, Td) vaccine. You may need a Td booster every 10 years. Zoster vaccine. You may need this after  age 74. Pneumococcal 13-valent conjugate (PCV13) vaccine. One dose is recommended after age 32. Pneumococcal polysaccharide (PPSV23) vaccine. One dose is recommended after age 87. Talk to your health care provider about which screenings and vaccines you need and how often you need them. This information is not intended to replace advice given to you by your health care provider. Make sure you discuss any questions you have with your health care provider. Document Released: 04/17/2015 Document Revised: 12/09/2015 Document Reviewed: 01/20/2015 Elsevier Interactive Patient Education  2017 [place] Prevention in the Home Falls can cause injuries. They can happen to people of all ages. There are many things you can do to make your home safe and to help prevent falls. What can I do on the outside of my home? Regularly fix the edges of walkways and driveways and fix any cracks. Remove anything that might make you trip as you walk through a door, such as a raised step or threshold. Trim any bushes or trees on the path to your home. Use bright outdoor lighting. Clear any walking paths of anything that might make someone trip, such as rocks or tools. Regularly check to see if handrails are loose or broken. Make sure that both sides of any steps have handrails. Any raised decks and porches should have guardrails on the edges. Have any leaves, snow, or ice cleared regularly. Use sand or salt on walking paths during winter. Clean up any spills in your garage right away. This includes oil or grease spills. What can I do in the bathroom? Use night lights. Install grab bars by the toilet and in the tub and shower. Do not use towel bars as grab bars. Use non-skid mats or decals in the tub or shower. If you need to sit down in the shower, use a plastic, non-slip stool. Keep the floor dry. Clean up any water that spills on the floor as soon as it happens. Remove soap buildup in the tub or shower  regularly. Attach bath mats securely with double-sided non-slip rug tape. Do not have throw rugs and other things on the floor that can make you trip. What can I do in the bedroom? Use night lights. Make sure that you have a light by your bed that is easy to reach. Do not use any sheets or blankets that are too big for your bed. They should not hang down onto the floor. Have a firm chair that has side arms. You can use this for support while you get dressed. Do not have throw rugs and other things on the floor that can make you trip. What can I do in the kitchen? Clean up any spills right away. Avoid walking on wet floors. Keep items that you use a lot in easy-to-reach places. If you need to reach something above you, use a strong step stool that has a grab bar. Keep electrical cords out of the way. Do not use floor polish or wax that makes floors slippery. If you must use wax, use non-skid floor wax. Do not have throw rugs and other things on the floor that can make you trip. What can I do with my stairs? Do not leave any items on the stairs. Make sure that there are handrails on both sides of the stairs and use them. Fix handrails that are broken or loose. Make sure that handrails are  as long as the stairways. Check any carpeting to make sure that it is firmly attached to the stairs. Fix any carpet that is loose or worn. Avoid having throw rugs at the top or bottom of the stairs. If you do have throw rugs, attach them to the floor with carpet tape. Make sure that you have a light switch at the top of the stairs and the bottom of the stairs. If you do not have them, ask someone to add them for you. What else can I do to help prevent falls? Wear shoes that: Do not have high heels. Have rubber bottoms. Are comfortable and fit you well. Are closed at the toe. Do not wear sandals. If you use a stepladder: Make sure that it is fully opened. Do not climb a closed stepladder. Make sure that  both sides of the stepladder are locked into place. Ask someone to hold it for you, if possible. Clearly mark and make sure that you can see: Any grab bars or handrails. First and last steps. Where the edge of each step is. Use tools that help you move around (mobility aids) if they are needed. These include: Canes. Walkers. Scooters. Crutches. Turn on the lights when you go into a dark area. Replace any light bulbs as soon as they burn out. Set up your furniture so you have a clear path. Avoid moving your furniture around. If any of your floors are uneven, fix them. If there are any pets around you, be aware of where they are. Review your medicines with your doctor. Some medicines can make you feel dizzy. This can increase your chance of falling. Ask your doctor what other things that you can do to help prevent falls. This information is not intended to replace advice given to you by your health care provider. Make sure you discuss any questions you have with your health care provider. Document Released: 01/15/2009 Document Revised: 08/27/2015 Document Reviewed: 04/25/2014 Elsevier Interactive Patient Education  2017 [employer].

## 2021-05-17 ENCOUNTER — Other Ambulatory Visit: Payer: Self-pay | Admitting: Physician Assistant

## 2021-05-18 ENCOUNTER — Other Ambulatory Visit: Payer: Self-pay

## 2021-05-18 ENCOUNTER — Encounter (INDEPENDENT_AMBULATORY_CARE_PROVIDER_SITE_OTHER): Payer: Medicare Other | Admitting: Ophthalmology

## 2021-05-18 DIAGNOSIS — H43813 Vitreous degeneration, bilateral: Secondary | ICD-10-CM

## 2021-05-18 DIAGNOSIS — B399 Histoplasmosis, unspecified: Secondary | ICD-10-CM | POA: Diagnosis not present

## 2021-05-18 DIAGNOSIS — H318 Other specified disorders of choroid: Secondary | ICD-10-CM | POA: Diagnosis not present

## 2021-06-04 ENCOUNTER — Other Ambulatory Visit: Payer: Self-pay | Admitting: *Deleted

## 2021-06-04 DIAGNOSIS — D509 Iron deficiency anemia, unspecified: Secondary | ICD-10-CM

## 2021-06-04 DIAGNOSIS — E538 Deficiency of other specified B group vitamins: Secondary | ICD-10-CM

## 2021-06-08 ENCOUNTER — Other Ambulatory Visit: Payer: Self-pay

## 2021-06-08 ENCOUNTER — Inpatient Hospital Stay: Payer: Medicare Other | Attending: Hematology

## 2021-06-08 ENCOUNTER — Inpatient Hospital Stay (HOSPITAL_BASED_OUTPATIENT_CLINIC_OR_DEPARTMENT_OTHER): Payer: Medicare Other | Admitting: Hematology

## 2021-06-08 VITALS — BP 147/72 | HR 100 | Temp 97.7°F | Resp 18 | Wt 184.8 lb

## 2021-06-08 DIAGNOSIS — Z8249 Family history of ischemic heart disease and other diseases of the circulatory system: Secondary | ICD-10-CM | POA: Diagnosis not present

## 2021-06-08 DIAGNOSIS — F17210 Nicotine dependence, cigarettes, uncomplicated: Secondary | ICD-10-CM | POA: Insufficient documentation

## 2021-06-08 DIAGNOSIS — Z833 Family history of diabetes mellitus: Secondary | ICD-10-CM | POA: Insufficient documentation

## 2021-06-08 DIAGNOSIS — Z86718 Personal history of other venous thrombosis and embolism: Secondary | ICD-10-CM | POA: Diagnosis not present

## 2021-06-08 DIAGNOSIS — E611 Iron deficiency: Secondary | ICD-10-CM | POA: Diagnosis not present

## 2021-06-08 DIAGNOSIS — E538 Deficiency of other specified B group vitamins: Secondary | ICD-10-CM

## 2021-06-08 DIAGNOSIS — D509 Iron deficiency anemia, unspecified: Secondary | ICD-10-CM

## 2021-06-08 DIAGNOSIS — D510 Vitamin B12 deficiency anemia due to intrinsic factor deficiency: Secondary | ICD-10-CM | POA: Diagnosis not present

## 2021-06-08 DIAGNOSIS — Z7982 Long term (current) use of aspirin: Secondary | ICD-10-CM | POA: Insufficient documentation

## 2021-06-08 LAB — CMP (CANCER CENTER ONLY)
ALT: 15 U/L (ref 0–44)
AST: 18 U/L (ref 15–41)
Albumin: 4.1 g/dL (ref 3.5–5.0)
Alkaline Phosphatase: 97 U/L (ref 38–126)
Anion gap: 5 (ref 5–15)
BUN: 26 mg/dL — ABNORMAL HIGH (ref 8–23)
CO2: 32 mmol/L (ref 22–32)
Calcium: 9.9 mg/dL (ref 8.9–10.3)
Chloride: 100 mmol/L (ref 98–111)
Creatinine: 0.92 mg/dL (ref 0.44–1.00)
GFR, Estimated: >60 mL/min (ref >60)
Glucose, Bld: 91 mg/dL (ref 70–99)
Potassium: 4 mmol/L (ref 3.5–5.1)
Sodium: 137 mmol/L (ref 135–145)
Total Bilirubin: 0.5 mg/dL (ref 0.3–1.2)
Total Protein: 7.6 g/dL (ref 6.5–8.1)

## 2021-06-08 LAB — CBC WITH DIFFERENTIAL (CANCER CENTER ONLY)
Abs Immature Granulocytes: 0.02 10*3/uL (ref 0.00–0.07)
Basophils Absolute: 0 10*3/uL (ref 0.0–0.1)
Basophils Relative: 1 %
Eosinophils Absolute: 0.2 10*3/uL (ref 0.0–0.5)
Eosinophils Relative: 2 %
HCT: 47.2 % — ABNORMAL HIGH (ref 36.0–46.0)
Hemoglobin: 15.8 g/dL — ABNORMAL HIGH (ref 12.0–15.0)
Immature Granulocytes: 0 %
Lymphocytes Relative: 18 %
Lymphs Abs: 1.3 10*3/uL (ref 0.7–4.0)
MCH: 31.5 pg (ref 26.0–34.0)
MCHC: 33.5 g/dL (ref 30.0–36.0)
MCV: 94.2 fL (ref 80.0–100.0)
Monocytes Absolute: 0.7 10*3/uL (ref 0.1–1.0)
Monocytes Relative: 9 %
Neutro Abs: 4.9 10*3/uL (ref 1.7–7.7)
Neutrophils Relative %: 70 %
Platelet Count: 164 10*3/uL (ref 150–400)
RBC: 5.01 MIL/uL (ref 3.87–5.11)
RDW: 13.3 % (ref 11.5–15.5)
WBC Count: 7.1 10*3/uL (ref 4.0–10.5)
nRBC: 0 % (ref 0.0–0.2)

## 2021-06-08 LAB — FERRITIN: Ferritin: 29 ng/mL (ref 11–307)

## 2021-06-08 LAB — VITAMIN B12: Vitamin B-12: 289 pg/mL (ref 180–914)

## 2021-06-08 LAB — IRON AND IRON BINDING CAPACITY (CC-WL,HP ONLY)
Iron: 105 ug/dL (ref 28–170)
Saturation Ratios: 25 % (ref 10.4–31.8)
TIBC: 421 ug/dL (ref 250–450)
UIBC: 316 ug/dL (ref 148–442)

## 2021-06-09 ENCOUNTER — Telehealth: Payer: Self-pay | Admitting: Hematology

## 2021-06-09 NOTE — Telephone Encounter (Signed)
Scheduled follow-up appointments per 3/7 los. Patient is aware. ?

## 2021-06-15 ENCOUNTER — Encounter: Payer: Self-pay | Admitting: Hematology

## 2021-06-15 MED ORDER — CYANOCOBALAMIN 1000 MCG/ML IJ SOLN: 1000.0000 ug | INTRAMUSCULAR | 3 refills | Status: DC

## 2021-06-15 NOTE — Progress Notes (Addendum)
? ? ?HEMATOLOGY/ONCOLOGY CLINIC NOTE ? ?Date of Service: 06/08/2021 ? ? ?Patient Care Team: ?Inda Coke, [place] as PCP - General (Physician Assistant) ?Hayden Pedro, MD as Consulting Physician (Ophthalmology) ?Brunetta Genera, MD as Consulting Physician (Hematology) ? ?CHIEF COMPLAINTS/PURPOSE OF CONSULTATION:  ?Follow-up for pernicious anemia causing B12 and iron deficiency ? ?HISTORY OF PRESENTING ILLNESS:  ?Please see previous note for details on initial presentation. ? ?Interval History: ? ?Jodi Myers is here for her scheduled follow-up for continued evaluation and management of pernicious anemia. ? ?Her last clinic visit with [place] was about nearly 1 year ago. ? ?She notes no acute new concerns.  No significant new fatigue.  No bleeding issues. ?No new leg pain or swelling.  No acute new shortness of breath. ?Still continuing to smoke cigarettes. ? ?Labs done today were reviewed in detail. ? ? ?MEDICAL HISTORY:  ?Past Medical History:  ?Diagnosis Date  ? Anemia 07/2018  ? bloood transfusion administered   ? Coronary artery disease   ? ? ?SURGICAL HISTORY: ?Past Surgical History:  ?Procedure Laterality Date  ? TONSILLECTOMY    ? ? ?SOCIAL HISTORY: ?Social History  ? ?Socioeconomic History  ? Marital status: Married  ?  Spouse name: Not on file  ? Number of children: Not on file  ? Years of education: Not on file  ? Highest education level: Not on file  ?Occupational History  ? Occupation: retired  ?Tobacco Use  ? Smoking status: Every Day  ?  Packs/day: 1.50  ?  Years: 49.00  ?  Pack years: 73.50  ?  Types: Cigarettes  ? Smokeless tobacco: Never  ?Vaping Use  ? Vaping Use: Never used  ?Substance and Sexual Activity  ? Alcohol use: Yes  ?  Alcohol/week: 7.0 standard drinks  ?  Types: 7 Cans of beer per week  ?  Comment: a beer a day  ? Drug use: Never  ? Sexual activity: Not on file  ?Other Topics Concern  ? Not on file  ?Social History Narrative  ? Not on file  ? ?Social Determinants of Health   ? ?Financial Resource Strain: Low Risk   ? Difficulty of Paying Living Expenses: Not hard at all  ?Food Insecurity: No Food Insecurity  ? Worried About Charity fundraiser in the Last Year: Never true  ? Ran Out of Food in the Last Year: Never true  ?Transportation Needs: No Transportation Needs  ? Lack of Transportation (Medical): No  ? Lack of Transportation (Non-Medical): No  ?Physical Activity: Inactive  ? Days of Exercise per Week: 0 days  ? Minutes of Exercise per Session: 0 min  ?Stress: No Stress Concern Present  ? Feeling of Stress : Not at all  ?Social Connections: Moderately Isolated  ? Frequency of Communication with Friends and Family: More than three times a week  ? Frequency of Social Gatherings with Friends and Family: More than three times a week  ? Attends Religious Services: Never  ? Active Member of Clubs or Organizations: No  ? Attends Archivist Meetings: Never  ? Marital Status: Married  ?Intimate Partner Violence: Not At Risk  ? Fear of Current or Ex-Partner: No  ? Emotionally Abused: No  ? Physically Abused: No  ? Sexually Abused: No  ? ? ?FAMILY HISTORY: ?Family History  ?Problem Relation Age of Onset  ? Diabetes Mellitus II Mother   ? Rheum arthritis Mother   ? CVA Mother   ? Diabetes Mellitus I Sister   ?  Hashimoto's thyroiditis Sister   ? Heart attack Father   ? Colon cancer Neg Hx   ? Liver disease Neg Hx   ? ? ?ALLERGIES:  is allergic to mango flavor, poison ivy extract, and sporanox [itraconazole]. ? ?MEDICATIONS:  ?Current Outpatient Medications  ?Medication Sig Dispense Refill  ? acetaminophen (TYLENOL) 325 MG tablet Take 2 tablets (650 mg total) by mouth every 6 (six) hours as needed for mild pain (or Fever >/= 101). 20 tablet 0  ? albuterol (VENTOLIN HFA) 108 (90 Base) MCG/ACT inhaler Inhale 2 puffs into the lungs every 6 (six) hours as needed for wheezing or shortness of breath. 8 g 0  ? ASPIRIN LOW DOSE 81 MG EC tablet TAKE 1 TABLET BY MOUTH EVERY DAY 90 tablet 3  ?  BESIVANCE 0.6 % SUSP Place 1 drop into the left eye See admin instructions. Instill 1 drop into left eye 4 times a day for 2 days after each monthly eye injection    ? CALCIUM PO Take 1 tablet by mouth daily.    ? cyanocobalamin (,VITAMIN B-12,) 1000 MCG/ML injection INJECT 1 ML (1,000 MCG TOTAL) INTO THE MUSCLE EVERY 30 DAYS. 3 mL 3  ? pantoprazole (PROTONIX) 40 MG tablet TAKE 1 TABLET BY MOUTH EVERY DAY 90 tablet 0  ? VITAMIN D PO Take 1 tablet by mouth daily.    ? ?No current facility-administered medications for this visit.  ? ? ?REVIEW OF SYSTEMS:   ?10 Point review of Systems was done is negative except as noted above. ? ?PHYSICAL EXAMINATION: ? ?Vitals:  ? 06/08/21 0928  ?BP: (!) 147/72  ?Pulse: 100  ?Resp: 18  ?Temp: 97.7 ?F (36.5 ?C)  ?SpO2: 95%  ? ?Filed Weights  ? 06/08/21 0928  ?Weight: 184 lb 12.8 oz (83.8 kg)  ? ?.Body mass index is 29.83 kg/m?Marland Kitchen  ?NAD ?GENERAL:alert, in no acute distress and comfortable ?SKIN: no acute rashes, no significant lesions ?EYES: conjunctiva are pink and non-injected, sclera anicteric ?OROPHARYNX: MMM, no exudates, no oropharyngeal erythema or ulceration ?NECK: supple, no JVD ?LYMPH:  no palpable lymphadenopathy in the cervical, axillary or inguinal regions ?LUNGS: clear to auscultation b/l with normal respiratory effort ?HEART: regular rate & rhythm ?ABDOMEN:  normoactive bowel sounds , non tender, not distended. ?Extremity: no pedal edema ?PSYCH: alert & oriented x 3 with fluent speech ?NEURO: no focal motor/sensory deficits ? ?LABORATORY DATA:  ?I have reviewed the data as listed ? ?. ?CBC Latest Ref Rng & Units 06/08/2021 07/15/2020 01/15/2020  ?WBC 4.0 - 10.5 K/uL 7.1 7.1 6.5  ?Hemoglobin 12.0 - 15.0 g/dL 15.8(H) 16.4(H) 16.0(H)  ?Hematocrit 36.0 - 46.0 % 47.2(H) 49.5(H) 47.8(H)  ?Platelets 150 - 400 K/uL 164 171 154  ? ? ?CMP Latest Ref Rng & Units 06/08/2021 07/15/2020 01/15/2020  ?Glucose 70 - 99 mg/dL 91 108(H) 93  ?BUN 8 - 23 mg/dL 26(H) 22 18  ?Creatinine 0.44 - 1.00  mg/dL 0.92 0.90 0.95  ?Sodium 135 - 145 mmol/L 137 140 141  ?Potassium 3.5 - 5.1 mmol/L 4.0 4.0 4.3  ?Chloride 98 - 111 mmol/L 100 102 104  ?CO2 22 - 32 mmol/L 32 25 32  ?Calcium 8.9 - 10.3 mg/dL 9.9 9.7 10.0  ?Total Protein 6.5 - 8.1 g/dL 7.6 7.5 7.5  ?Total Bilirubin 0.3 - 1.2 mg/dL 0.5 0.4 0.4  ?Alkaline Phos 38 - 126 U/L 97 98 85  ?AST 15 - 41 U/L 18 14(L) 18  ?ALT 0 - 44 U/L '15 10 17  '$ ? ?  Intrinsic Factor ?    0.0 - 1.1 AU/mL  18.2 (H)  ?Parietal Cell Antibody-IgG ?    0.0 - 20.0 Units  70.1 (H)  ? ?. ?Lab Results  ?Component Value Date  ? IRON 105 06/08/2021  ? TIBC 421 06/08/2021  ? IRONPCTSAT 25 06/08/2021  ? ?(Iron and TIBC) ? ?Lab Results  ?Component Value Date  ? FERRITIN 29 06/08/2021  ? ?B12 --- 289 ? ?RADIOGRAPHIC STUDIES: ? ?I have personally reviewed the radiological images as listed and agreed with the findings in the report. ?No results found. ? ?ASSESSMENT & PLAN:  ? ?71 y.o. female with ? ?1. S/p Pancytopenia likely due to severe B12 deficiency- now resolved. ?Patient's labs upon initial presentation 01/23/18, Vitamin B12 low at <50. S/p blood transfusion the pt's HGB increased from 4.9 to 8.6.  ? ?Anemia a combination of pernicious anemia causing B12 and iron deficiency, and a concern of GI bleeding ? ?2. Severe B12 deficiency due to pernicious anemia ?+ve parietal cell and Intrinsic factor antibodies. ?01/24/18 anti-parietal antibody and intrinsic factor antibody are both elevated consistent with pernicious anemia.  ? ?3. H/o DVT rt posterior tibial veins. ?Likely triggering event - 2 packs/day of cigarette smoking. ? ?01/23/18 VAS US revealed  Right: findings consistent with acute DVT involving the right posterior tibial vein  ?S/p 6 months of Xarelto ?Plan ?-conitnue ASA '81mg'$  po daily ? ?4. Iron deficiency ? ?PLAN: ?-Patient has labs done today 06/08/2021 were discussed in details. ?-CBC shows normal blood counts with a hemoglobin of 15.8, WBC count of 7.1k and platelets of 164k. ?-B12 levels  289 ?-Ferritin 29 with an iron saturation of 25% ?CMP within normal limits ?-Continue '81mg'$  Aspirin ?-Her B12 levels are still running low which would increase the subcutaneous B12 to 1000 mcg every 3 weeks instea

## 2021-06-15 NOTE — Addendum Note (Signed)
Addended by: Sullivan Lone on: 06/15/2021 07:58 AM ? ? Modules accepted: Orders ? ?

## 2021-06-17 ENCOUNTER — Telehealth: Payer: Self-pay

## 2021-06-17 NOTE — Telephone Encounter (Signed)
Contacted pt per Dr Irene Limbo: to let the patient know her B12 levels are still a little on the lower side at 289.  Have changed her B12 prescription to 1000 mcg subcu every 3 weeks instead of every 4 weeks to optimize this.  Also offered her the option of 1 additional dose of IV iron to build up her iron stores in the context of pernicious anemia since she might not absorb oral iron well.  If she is agreeable to IV iron we can schedule this.  ?Pt acknowledged and verbalized understanding. Will send a scheduling message for iron.  ?

## 2021-06-21 ENCOUNTER — Telehealth: Payer: Self-pay | Admitting: Hematology

## 2021-06-21 NOTE — Telephone Encounter (Signed)
.  Called patient to schedule appointment per 3/16 inbasket, patient is aware of date and time.   ?

## 2021-06-22 ENCOUNTER — Other Ambulatory Visit: Payer: Self-pay | Admitting: *Deleted

## 2021-06-23 ENCOUNTER — Other Ambulatory Visit: Payer: Self-pay | Admitting: Hematology

## 2021-06-23 ENCOUNTER — Other Ambulatory Visit: Payer: Self-pay

## 2021-06-23 ENCOUNTER — Inpatient Hospital Stay: Payer: Medicare Other

## 2021-06-23 VITALS — BP 147/89 | HR 89 | Temp 98.5°F | Resp 16

## 2021-06-23 DIAGNOSIS — D509 Iron deficiency anemia, unspecified: Secondary | ICD-10-CM

## 2021-06-23 DIAGNOSIS — E611 Iron deficiency: Secondary | ICD-10-CM | POA: Diagnosis not present

## 2021-06-23 DIAGNOSIS — Z8249 Family history of ischemic heart disease and other diseases of the circulatory system: Secondary | ICD-10-CM | POA: Diagnosis not present

## 2021-06-23 DIAGNOSIS — F17210 Nicotine dependence, cigarettes, uncomplicated: Secondary | ICD-10-CM | POA: Diagnosis not present

## 2021-06-23 DIAGNOSIS — D510 Vitamin B12 deficiency anemia due to intrinsic factor deficiency: Secondary | ICD-10-CM | POA: Diagnosis not present

## 2021-06-23 DIAGNOSIS — Z86718 Personal history of other venous thrombosis and embolism: Secondary | ICD-10-CM | POA: Diagnosis not present

## 2021-06-23 DIAGNOSIS — Z7982 Long term (current) use of aspirin: Secondary | ICD-10-CM | POA: Diagnosis not present

## 2021-06-23 MED ORDER — SODIUM CHLORIDE 0.9 % IV SOLN: INTRAVENOUS | Status: DC

## 2021-06-23 MED ORDER — SODIUM CHLORIDE 0.9 % IV SOLN
750.0000 mg | Freq: Once | INTRAVENOUS | Status: AC
  Administered 2021-06-23: 750 mg via INTRAVENOUS
  Filled 2021-06-23: qty 15

## 2021-06-23 NOTE — Patient Instructions (Signed)
Ferric Carboxymaltose Injection °What is this medication? °FERRIC CARBOXYMALTOSE (FER ik kar BOX ee MAWL tose) treats low levels of iron in your body (iron deficiency anemia). Iron is a mineral that plays an important role in making red blood cells, which carry oxygen from your lungs to the rest of your body. °This medicine may be used for other purposes; ask your health care provider or pharmacist if you have questions. °COMMON BRAND NAME(S): Injectafer °What should I tell my care team before I take this medication? °They need to know if you have any of these conditions: °High blood pressure °High levels of iron in the blood °An unusual or allergic reaction to iron, other medications, foods, dyes, or preservatives °Pregnant or trying to get pregnant °Breast-feeding °How should I use this medication? °This medication is injected into a vein. It is given by your care team in a hospital or clinic setting. °Talk to your care team about the use of this medication in children. While it may be given to children as young as 1 year for selected conditions, precautions do apply. °Overdosage: If you think you have taken too much of this medicine contact a poison control center or emergency room at once. °NOTE: This medicine is only for you. Do not share this medicine with others. °What if I miss a dose? °Keep appointments for follow-up doses. It is important not to miss your dose. Call your care team if you are unable to keep an appointment. °What may interact with this medication? °Do not take this medication with any of the following medications: °Deferoxamine °Dimercaprol °Other iron products °This list may not describe all possible interactions. Give your health care provider a list of all the medicines, herbs, non-prescription drugs, or dietary supplements you use. Also tell them if you smoke, drink alcohol, or use illegal drugs. Some items may interact with your medicine. °What should I watch for while using this  medication? °Visit your care team for regular checks on your progress. Tell your care team if your symptoms do not start to get better or if they get worse. °You may need blood work while you are taking this medication. °You may need to eat more foods that contain iron. Talk to your care team. Foods that contain iron include whole grains/cereals, dried fruits, beans, or peas, leafy green vegetables, and organ meats (liver, kidney). °What side effects may I notice from receiving this medication? °Side effects that you should report to your care team as soon as possible: °Allergic reactions--skin rash, itching, hives, swelling of the face, lips, tongue, or throat °Increase in blood pressure °Low blood pressure--dizziness, feeling faint or lightheaded, blurry vision °Shortness of breath °Side effects that usually do not require medical attention (report to your care team if they continue or are bothersome): °Flushing °Headache °Joint pain °Muscle pain °Nausea °Pain, redness, or irritation at injection site °This list may not describe all possible side effects. Call your doctor for medical advice about side effects. You may report side effects to FDA at 1-800-FDA-1088. °Where should I keep my medication? °This medication is given in a hospital or clinic. It will not be stored at home. °NOTE: This sheet is a summary. It may not cover all possible information. If you have questions about this medicine, talk to your doctor, pharmacist, or health care provider. °© 2022 Elsevier/[employer] (2020-08-14 00:00:00) ° °

## 2021-07-06 ENCOUNTER — Encounter (INDEPENDENT_AMBULATORY_CARE_PROVIDER_SITE_OTHER): Payer: Medicare Other | Admitting: Ophthalmology

## 2021-07-06 DIAGNOSIS — H43813 Vitreous degeneration, bilateral: Secondary | ICD-10-CM

## 2021-07-06 DIAGNOSIS — B399 Histoplasmosis, unspecified: Secondary | ICD-10-CM

## 2021-07-06 DIAGNOSIS — H318 Other specified disorders of choroid: Secondary | ICD-10-CM

## 2021-08-15 ENCOUNTER — Other Ambulatory Visit: Payer: Self-pay | Admitting: Physician Assistant

## 2021-08-31 ENCOUNTER — Encounter (INDEPENDENT_AMBULATORY_CARE_PROVIDER_SITE_OTHER): Payer: Medicare Other | Admitting: Ophthalmology

## 2021-08-31 DIAGNOSIS — H43813 Vitreous degeneration, bilateral: Secondary | ICD-10-CM | POA: Diagnosis not present

## 2021-08-31 DIAGNOSIS — H318 Other specified disorders of choroid: Secondary | ICD-10-CM | POA: Diagnosis not present

## 2021-08-31 DIAGNOSIS — B399 Histoplasmosis, unspecified: Secondary | ICD-10-CM

## 2021-09-28 ENCOUNTER — Encounter: Payer: Self-pay | Admitting: Family

## 2021-09-28 ENCOUNTER — Ambulatory Visit (INDEPENDENT_AMBULATORY_CARE_PROVIDER_SITE_OTHER): Payer: Medicare Other | Admitting: Family

## 2021-09-28 VITALS — BP 116/62 | HR 68 | Temp 97.9°F | Ht 66.0 in | Wt 183.1 lb

## 2021-09-28 DIAGNOSIS — S80869A Insect bite (nonvenomous), unspecified lower leg, initial encounter: Secondary | ICD-10-CM

## 2021-09-28 DIAGNOSIS — W57XXXA Bitten or stung by nonvenomous insect and other nonvenomous arthropods, initial encounter: Secondary | ICD-10-CM | POA: Diagnosis not present

## 2021-09-28 MED ORDER — METHYLPREDNISOLONE ACETATE 80 MG/ML IJ SUSP
80.0000 mg | Freq: Once | INTRAMUSCULAR | Status: AC
  Administered 2021-09-28: 80 mg via INTRAMUSCULAR

## 2021-09-28 MED ORDER — TRIAMCINOLONE ACETONIDE 0.5 % EX OINT: 1.0000 | TOPICAL_OINTMENT | Freq: Two times a day (BID) | CUTANEOUS | 0 refills | Status: DC

## 2021-09-28 MED ORDER — HYDROXYZINE HCL 10 MG PO TABS: 10.0000 mg | ORAL_TABLET | Freq: Three times a day (TID) | ORAL | 0 refills | Status: DC | PRN

## 2021-10-26 ENCOUNTER — Encounter (INDEPENDENT_AMBULATORY_CARE_PROVIDER_SITE_OTHER): Payer: Medicare Other | Admitting: Ophthalmology

## 2021-10-26 DIAGNOSIS — H43813 Vitreous degeneration, bilateral: Secondary | ICD-10-CM | POA: Diagnosis not present

## 2021-10-26 DIAGNOSIS — B399 Histoplasmosis, unspecified: Secondary | ICD-10-CM | POA: Diagnosis not present

## 2021-10-26 DIAGNOSIS — H318 Other specified disorders of choroid: Secondary | ICD-10-CM

## 2021-11-15 ENCOUNTER — Other Ambulatory Visit: Payer: Self-pay | Admitting: Physician Assistant

## 2021-12-21 ENCOUNTER — Encounter (INDEPENDENT_AMBULATORY_CARE_PROVIDER_SITE_OTHER): Payer: Medicare Other | Admitting: Ophthalmology

## 2021-12-21 DIAGNOSIS — H318 Other specified disorders of choroid: Secondary | ICD-10-CM

## 2021-12-21 DIAGNOSIS — H43813 Vitreous degeneration, bilateral: Secondary | ICD-10-CM

## 2021-12-21 DIAGNOSIS — B399 Histoplasmosis, unspecified: Secondary | ICD-10-CM | POA: Diagnosis not present

## 2021-12-27 ENCOUNTER — Encounter: Payer: Self-pay | Admitting: *Deleted

## 2022-01-05 DIAGNOSIS — Z23 Encounter for immunization: Secondary | ICD-10-CM | POA: Diagnosis not present

## 2022-02-03 DIAGNOSIS — Z1231 Encounter for screening mammogram for malignant neoplasm of breast: Secondary | ICD-10-CM | POA: Diagnosis not present

## 2022-02-03 LAB — HM MAMMOGRAPHY

## 2022-02-11 ENCOUNTER — Encounter: Payer: Self-pay | Admitting: Physician Assistant

## 2022-02-11 ENCOUNTER — Ambulatory Visit (INDEPENDENT_AMBULATORY_CARE_PROVIDER_SITE_OTHER): Payer: Medicare Other | Admitting: Physician Assistant

## 2022-02-11 VITALS — BP 145/81 | HR 87 | Temp 97.1°F | Ht 66.0 in | Wt 178.6 lb

## 2022-02-11 DIAGNOSIS — D510 Vitamin B12 deficiency anemia due to intrinsic factor deficiency: Secondary | ICD-10-CM | POA: Diagnosis not present

## 2022-02-11 DIAGNOSIS — J189 Pneumonia, unspecified organism: Secondary | ICD-10-CM

## 2022-02-11 MED ORDER — AZITHROMYCIN 250 MG PO TABS: ORAL_TABLET | ORAL | 0 refills | Status: AC

## 2022-02-11 MED ORDER — METHYLPREDNISOLONE ACETATE 80 MG/ML IJ SUSP
80.0000 mg | Freq: Once | INTRAMUSCULAR | Status: AC
  Administered 2022-02-11: 80 mg via INTRAMUSCULAR

## 2022-02-11 MED ORDER — AMOXICILLIN-POT CLAVULANATE 875-125 MG PO TABS: 1.0000 | ORAL_TABLET | Freq: Two times a day (BID) | ORAL | 0 refills | Status: DC

## 2022-02-11 NOTE — Addendum Note (Signed)
Addended by: Loura Back on: 02/11/2022 03:47 PM   Modules accepted: Orders

## 2022-02-11 NOTE — Addendum Note (Signed)
Addended by: Loura Back on: 02/11/2022 03:48 PM   Modules accepted: Orders

## 2022-02-11 NOTE — Patient Instructions (Addendum)
It was great to see you!  I am concerned you may have pneumonia  Start the augmentin (amoxicillin-clavulanate) and azithromycin antibiotic  We did a steroid injection today  Push fluids and get plenty of rest. Please return if you are not improving as expected, or if you have high fevers (>101.5) or difficulty swallowing or worsening productive cough.  Call clinic with questions.  I hope you start feeling better soon!   Follow-up in 1 week for symptoms/lung recheck, sooner if concerns

## 2022-02-11 NOTE — Progress Notes (Signed)
Jodi Myers is a 71 y.o. female here for a new problem.  History of Present Illness:   Chief Complaint  Patient presents with   Cough    Started last Friday. Pt says that she has been sick for a week, she is getting better.She has taken OTC Mucinex. Cough is clear.    Nasal Congestion    HPI  Cough Onset a week ago, she states it seems to be improving but having trouble taking deep breaths. She is a heavy smoker about 1.5 PPD but hasn't had "heavy smoking" since her cough. She is using her albuterol x2-3 times a day. She has also tried Mucinex. Felt awful after sx initially started but they have improved.  Denies any chest pain, shortness or breath or any exposure to sickness.   Pernicious Anemia She sees Dr. Irene Limbo for this issue and would like blood work checked for this while she is here today.    Past Medical History:  Diagnosis Date   Anemia 07/2018   bloood transfusion administered    Coronary artery disease      Social History   Tobacco Use   Smoking status: Every Day    Packs/day: 1.50    Years: 49.00    Total pack years: 73.50    Types: Cigarettes   Smokeless tobacco: Never  Vaping Use   Vaping Use: Never used  Substance Use Topics   Alcohol use: Yes    Alcohol/week: 7.0 standard drinks of alcohol    Types: 7 Cans of beer per week    Comment: a beer a day   Drug use: Never    Past Surgical History:  Procedure Laterality Date   TONSILLECTOMY      Family History  Problem Relation Age of Onset   Diabetes Mellitus II Mother    Rheum arthritis Mother    CVA Mother    Diabetes Mellitus I Sister    Hashimoto's thyroiditis Sister    Heart attack Father    Colon cancer Neg Hx    Liver disease Neg Hx     Allergies  Allergen Reactions   Mango Flavor Other (See Comments)    Blisters, JUST MANGO, skin of the mango   Poison Ivy Extract Other (See Comments)    Blisters   Sporanox [Itraconazole] Other (See Comments)    Blisters    Current  Medications:   Current Outpatient Medications:    acetaminophen (TYLENOL) 325 MG tablet, Take 2 tablets (650 mg total) by mouth every 6 (six) hours as needed for mild pain (or Fever >/= 101)., Disp: 20 tablet, Rfl: 0   albuterol (VENTOLIN HFA) 108 (90 Base) MCG/ACT inhaler, Inhale 2 puffs into the lungs every 6 (six) hours as needed for wheezing or shortness of breath., Disp: 8 g, Rfl: 0   amoxicillin-clavulanate (AUGMENTIN) 875-125 MG tablet, Take 1 tablet by mouth 2 (two) times daily., Disp: 20 tablet, Rfl: 0   ASPIRIN LOW DOSE 81 MG EC tablet, TAKE 1 TABLET BY MOUTH EVERY DAY, Disp: 90 tablet, Rfl: 3   azithromycin (ZITHROMAX) 250 MG tablet, Take 2 tablets on day 1, then 1 tablet daily on days 2 through 5, Disp: 6 tablet, Rfl: 0   BESIVANCE 0.6 % SUSP, Place 1 drop into the left eye See admin instructions. Instill 1 drop into left eye 4 times a day for 2 days after each monthly eye injection, Disp: , Rfl:    CALCIUM PO, Take 1 tablet by mouth daily., Disp: , Rfl:  cyanocobalamin (,VITAMIN B-12,) 1000 MCG/ML injection, Inject 1 mL (1,000 mcg total) into the skin every 21 ( twenty-one) days., Disp: 12 mL, Rfl: 3   hydrOXYzine (ATARAX) 10 MG tablet, Take 1 tablet (10 mg total) by mouth 3 (three) times daily as needed., Disp: 30 tablet, Rfl: 0   pantoprazole (PROTONIX) 40 MG tablet, TAKE 1 TABLET BY MOUTH EVERY DAY, Disp: 90 tablet, Rfl: 0   triamcinolone ointment (KENALOG) 0.5 %, Apply 1 Application topically 2 (two) times daily., Disp: 30 g, Rfl: 0   VITAMIN D PO, Take 1 tablet by mouth daily., Disp: , Rfl:    Review of Systems:   Review of Systems  Constitutional:  Negative for chills, fever, malaise/fatigue and weight loss.  HENT:  Positive for congestion. Negative for hearing loss, sinus pain and sore throat.   Respiratory:  Positive for cough and sputum production. Negative for hemoptysis.   Cardiovascular:  Negative for chest pain, palpitations, leg swelling and PND.  Gastrointestinal:   Negative for abdominal pain, constipation, diarrhea, heartburn, nausea and vomiting.  Genitourinary:  Negative for dysuria, frequency and urgency.  Musculoskeletal:  Negative for back pain, myalgias and neck pain.  Skin:  Negative for itching and rash.  Neurological:  Negative for dizziness, tingling, seizures and headaches.  Endo/Heme/Allergies:  Negative for polydipsia.  Psychiatric/Behavioral:  Negative for depression. The patient is not nervous/anxious.     Vitals:   Vitals:   02/11/22 1516  BP: (!) 145/81  Pulse: 87  Temp: (!) 97.1 F (36.2 C)  TempSrc: Temporal  SpO2: 93%  Weight: 178 lb 9.6 oz (81 kg)  Height: '5\' 6"'$  (1.676 m)     Body mass index is 28.83 kg/m.  Physical Exam:   Physical Exam Vitals and nursing note reviewed.  Constitutional:      General: She is not in acute distress.    Appearance: She is well-developed. She is not ill-appearing or toxic-appearing.  HENT:     Head: Normocephalic and atraumatic.     Right Ear: Tympanic membrane, ear canal and external ear normal. Tympanic membrane is not erythematous, retracted or bulging.     Left Ear: Tympanic membrane, ear canal and external ear normal. Tympanic membrane is not erythematous, retracted or bulging.     Nose: Nose normal.     Right Sinus: No maxillary sinus tenderness or frontal sinus tenderness.     Left Sinus: No maxillary sinus tenderness or frontal sinus tenderness.     Mouth/Throat:     Pharynx: Uvula midline. No posterior oropharyngeal erythema.  Eyes:     General: Lids are normal.     Conjunctiva/sclera: Conjunctivae normal.  Neck:     Trachea: Trachea normal.  Cardiovascular:     Rate and Rhythm: Normal rate and regular rhythm.     Pulses: Normal pulses.     Heart sounds: Normal heart sounds, S1 normal and S2 normal.  Pulmonary:     Effort: Pulmonary effort is normal.     Breath sounds: Examination of the right-lower field reveals rales. Rales present. No decreased breath sounds,  wheezing or rhonchi.  Lymphadenopathy:     Cervical: No cervical adenopathy.  Skin:    General: Skin is warm and dry.  Neurological:     Mental Status: She is alert.     GCS: GCS eye subscore is 4. GCS verbal subscore is 5. GCS motor subscore is 6.  Psychiatric:        Speech: Speech normal.  Behavior: Behavior normal. Behavior is cooperative.     Assessment and Plan:   Community acquired pneumonia, unspecified laterality Suspect PNA No sx to suggest sepsis at this time, she overall appears well and vitals are stable She received depo-medrol 80 mg today  Start oral augmentin and azithromycin Follow-up in 1 week to recheck We are also getting CBC-- see below Any worsening sx, needs close follow-up -- CXR and possible ER  Pernicious anemia Update blood work per patient request for heme/onc provider  I,Moesha Myer,acting as a Education administrator for [employer], PA.,have documented all relevant documentation on the behalf of Inda Coke, PA,as directed by  Inda Coke, PA while in the presence of Inda Coke, [place].  I, Inda Coke, [place], have reviewed all documentation for this visit. The documentation on 02/11/22 for the exam, diagnosis, procedures, and orders are all accurate and complete.  Inda Coke PA-C

## 2022-02-12 LAB — COMPREHENSIVE METABOLIC PANEL
AG Ratio: 1.1 (calc) (ref 1.0–2.5)
ALT: 14 U/L (ref 6–29)
AST: 15 U/L (ref 10–35)
Albumin: 3.9 g/dL (ref 3.6–5.1)
Alkaline phosphatase (APISO): 90 U/L (ref 37–153)
BUN: 22 mg/dL (ref 7–25)
CO2: 27 mmol/L (ref 20–32)
Calcium: 9.6 mg/dL (ref 8.6–10.4)
Chloride: 102 mmol/L (ref 98–110)
Creat: 1 mg/dL (ref 0.60–1.00)
Globulin: 3.6 g/dL (calc) (ref 1.9–3.7)
Glucose, Bld: 110 mg/dL — ABNORMAL HIGH (ref 65–99)
Potassium: 3.7 mmol/L (ref 3.5–5.3)
Sodium: 140 mmol/L (ref 135–146)
Total Bilirubin: 0.5 mg/dL (ref 0.2–1.2)
Total Protein: 7.5 g/dL (ref 6.1–8.1)

## 2022-02-12 LAB — CBC WITH DIFFERENTIAL/PLATELET
Absolute Monocytes: 608 cells/uL (ref 200–950)
Basophils Absolute: 30 cells/uL (ref 0–200)
Basophils Relative: 0.4 %
Eosinophils Absolute: 143 cells/uL (ref 15–500)
Eosinophils Relative: 1.9 %
HCT: 48.3 % — ABNORMAL HIGH (ref 35.0–45.0)
Hemoglobin: 16.9 g/dL — ABNORMAL HIGH (ref 11.7–15.5)
Lymphs Abs: 1395 cells/uL (ref 850–3900)
MCH: 31.6 pg (ref 27.0–33.0)
MCHC: 35 g/dL (ref 32.0–36.0)
MCV: 90.3 fL (ref 80.0–100.0)
MPV: 11.8 fL (ref 7.5–12.5)
Monocytes Relative: 8.1 %
Neutro Abs: 5325 cells/uL (ref 1500–7800)
Neutrophils Relative %: 71 %
Platelets: 156 10*3/uL (ref 140–400)
RBC: 5.35 10*6/uL — ABNORMAL HIGH (ref 3.80–5.10)
RDW: 12.7 % (ref 11.0–15.0)
Total Lymphocyte: 18.6 %
WBC: 7.5 10*3/uL (ref 3.8–10.8)

## 2022-02-12 LAB — IRON,TIBC AND FERRITIN PANEL
%SAT: 12 % (calc) — ABNORMAL LOW (ref 16–45)
Ferritin: 117 ng/mL (ref 16–288)
Iron: 37 ug/dL — ABNORMAL LOW (ref 45–160)
TIBC: 315 mcg/dL (calc) (ref 250–450)

## 2022-02-12 LAB — VITAMIN B12: Vitamin B-12: 1269 pg/mL — ABNORMAL HIGH (ref 200–1100)

## 2022-02-15 ENCOUNTER — Encounter (INDEPENDENT_AMBULATORY_CARE_PROVIDER_SITE_OTHER): Payer: Medicare Other | Admitting: Ophthalmology

## 2022-02-15 ENCOUNTER — Other Ambulatory Visit: Payer: Self-pay | Admitting: Physician Assistant

## 2022-02-15 DIAGNOSIS — H43813 Vitreous degeneration, bilateral: Secondary | ICD-10-CM

## 2022-02-15 DIAGNOSIS — H318 Other specified disorders of choroid: Secondary | ICD-10-CM

## 2022-02-15 DIAGNOSIS — B399 Histoplasmosis, unspecified: Secondary | ICD-10-CM | POA: Diagnosis not present

## 2022-02-17 ENCOUNTER — Ambulatory Visit: Payer: Medicare Other | Admitting: Physician Assistant

## 2022-03-03 ENCOUNTER — Encounter: Payer: Self-pay | Admitting: Physician Assistant

## 2022-03-10 ENCOUNTER — Other Ambulatory Visit: Payer: Medicare Other

## 2022-03-14 ENCOUNTER — Telehealth: Payer: Self-pay | Admitting: Physician Assistant

## 2022-03-14 DIAGNOSIS — L600 Ingrowing nail: Secondary | ICD-10-CM

## 2022-03-14 NOTE — Telephone Encounter (Signed)
Please advise 

## 2022-03-14 NOTE — Telephone Encounter (Signed)
Referral has been placed. 

## 2022-03-14 NOTE — Telephone Encounter (Signed)
Patient is requesting any recommendations to a podiatrist for ingrown toenail.   Pt states she hasn't had to have a referral before with her insurance. States she will make an OV with PCP if a referral is needed by PCP's recommended podiatrist.   Please advise.

## 2022-03-22 ENCOUNTER — Ambulatory Visit (INDEPENDENT_AMBULATORY_CARE_PROVIDER_SITE_OTHER): Payer: Medicare Other | Admitting: Podiatry

## 2022-03-22 VITALS — BP 145/83 | HR 81

## 2022-03-22 DIAGNOSIS — L600 Ingrowing nail: Secondary | ICD-10-CM

## 2022-03-22 DIAGNOSIS — I739 Peripheral vascular disease, unspecified: Secondary | ICD-10-CM | POA: Diagnosis not present

## 2022-03-22 NOTE — Patient Instructions (Signed)

## 2022-03-22 NOTE — Progress Notes (Signed)
Subjective:   Patient ID: Jodi Myers, female   DOB: 71 y.o.   MRN: [id number]   HPI Chief Complaint  Patient presents with   Ingrown Toenail    Right ingrown toenail, started 2 weeks ago, no drainage, no swelling     71 year old female presents the office with above concerns.  She states it has been ingrown for years but it didn't hurt. It started hurting about 2 weeks ago. She trimmed it herself and now it is not hurting as much. No drainage or pus.    Review of Systems  All other systems reviewed and are negative.   Past Medical History:  Diagnosis Date   Anemia 07/2018   bloood transfusion administered    Coronary artery disease     Past Surgical History:  Procedure Laterality Date   TONSILLECTOMY       Current Outpatient Medications:    acetaminophen (TYLENOL) 325 MG tablet, Take 2 tablets (650 mg total) by mouth every 6 (six) hours as needed for mild pain (or Fever >/= 101)., Disp: 20 tablet, Rfl: 0   albuterol (VENTOLIN HFA) 108 (90 Base) MCG/ACT inhaler, Inhale 2 puffs into the lungs every 6 (six) hours as needed for wheezing or shortness of breath., Disp: 8 g, Rfl: 0   amoxicillin-clavulanate (AUGMENTIN) 875-125 MG tablet, Take 1 tablet by mouth 2 (two) times daily., Disp: 20 tablet, Rfl: 0   ASPIRIN LOW DOSE 81 MG EC tablet, TAKE 1 TABLET BY MOUTH EVERY DAY, Disp: 90 tablet, Rfl: 3   BESIVANCE 0.6 % SUSP, Place 1 drop into the left eye See admin instructions. Instill 1 drop into left eye 4 times a day for 2 days after each monthly eye injection, Disp: , Rfl:    CALCIUM PO, Take 1 tablet by mouth daily., Disp: , Rfl:    cyanocobalamin (,VITAMIN B-12,) 1000 MCG/ML injection, Inject 1 mL (1,000 mcg total) into the skin every 21 ( twenty-one) days., Disp: 12 mL, Rfl: 3   hydrOXYzine (ATARAX) 10 MG tablet, Take 1 tablet (10 mg total) by mouth 3 (three) times daily as needed., Disp: 30 tablet, Rfl: 0   pantoprazole (PROTONIX) 40 MG tablet, TAKE 1 TABLET BY MOUTH EVERY  DAY, Disp: 90 tablet, Rfl: 0   triamcinolone ointment (KENALOG) 0.5 %, Apply 1 Application topically 2 (two) times daily., Disp: 30 g, Rfl: 0   VITAMIN D PO, Take 1 tablet by mouth daily., Disp: , Rfl:   Allergies  Allergen Reactions   Mango Flavor Other (See Comments)    Blisters, JUST MANGO, skin of the mango   Poison Ivy Extract Other (See Comments)    Blisters   Sporanox [Itraconazole] Other (See Comments)    Blisters          Objective:  Physical Exam  General: AAO x3, NAD  Dermatological: The right hallux nail is hypertrophic, dystrophic with yellow, brown discoloration.  There is incurvation of the nail borders.  No edema, erythema.  No open lesions.  Vascular: Dorsalis Pedis artery and Posterior Tibial artery pedal pulses are palpable bilateral with immedate capillary fill time. There is no pain with calf compression, swelling, warmth, erythema.   Neruologic: Grossly intact via light touch bilateral.  Musculoskeletal: Tenderness to the right hallux toenail as well as the ingrown toenail portion.  No obvious discomfort.  Gait: Unassisted, Nonantalgic.       Assessment:   Right hallux onychodystrophy, ingrown toenail     Plan:  -Treatment options discussed including all alternatives,  risks, and complications -Etiology of symptoms were discussed -ABI performed in the office which was normal. -At this time, the patient is requesting total nail removal with chemical matricectomy due to ongoing pain to the toenail. We discussed partial removal but she wants to proceed with total.  Risks and complications were discussed with the patient for which they understand and written consent was obtained. Under sterile conditions a total of 3 mL of a mixture of 2% lidocaine plain and 0.5% Marcaine plain was infiltrated in a hallux block fashion. Once anesthetized, the skin was prepped in sterile fashion. A tourniquet was then applied. Next the symptomatic borders of the hallux nail  border was then sharply excised making sure to remove the entire offending nail border. Once the nails were ensured to be removed area was debrided and the underlying skin was intact. There is no purulence identified in the procedure. Next phenol was then applied under standard conditions and copiously irrigated.  Silvadene was applied. A dry sterile dressing was applied. After application of the dressing the tourniquet was removed and there is found to be an immediate capillary refill time to the digit. The patient tolerated the procedure well any complications. Post procedure instructions were discussed the patient for which he verbally understood. Discussed signs/symptoms of infection and directed to call the office immediately should any occur or go directly to the emergency room. In the meantime, encouraged to call the office with any questions, concerns, changes symptoms.  Trula Slade DPM

## 2022-04-05 ENCOUNTER — Ambulatory Visit (INDEPENDENT_AMBULATORY_CARE_PROVIDER_SITE_OTHER): Payer: Medicare Other | Admitting: Podiatry

## 2022-04-05 ENCOUNTER — Encounter: Payer: Self-pay | Admitting: Podiatry

## 2022-04-05 VITALS — BP 158/73 | HR 73 | Temp 97.5°F

## 2022-04-05 DIAGNOSIS — L600 Ingrowing nail: Secondary | ICD-10-CM | POA: Diagnosis not present

## 2022-04-05 MED ORDER — CEPHALEXIN 500 MG PO CAPS: 500.0000 mg | ORAL_CAPSULE | Freq: Three times a day (TID) | ORAL | 0 refills | Status: DC

## 2022-04-05 NOTE — Patient Instructions (Signed)
For soaking you can switch to a warm water with a small amount of betadine two times a day for 20 minutes each. After soaking, dry well then apply a small amount of antibiotic ointment and bandage. You can leave it open at night or at home but if you are wearing shoes/socks I would keep it covered.  Monitor for any signs/symptoms of infection. Call the office immediately if any occur or go directly to the emergency room. Call with any questions/concerns.

## 2022-04-05 NOTE — Progress Notes (Signed)
Subjective: Chief Complaint  Patient presents with   Toe Pain    Follow up right hallux   "There is still drainage. I called on Friday. I'm still soaking and covering"   Denies any systemic complaints such as fevers, chills, nausea, vomiting. No acute changes since last appointment, and no other complaints at this time.   Objective: AAO x3, NAD DP/PT pulses palpable bilaterally, CRT less than 3 seconds Little red and fibrotic tissue No pain with calf compression, swelling, warmth, erythema  Assessment:  Plan: -All treatment options discussed with the patient including all alternatives, risks, complications.  -Keflex -Switch to betadine -Patient encouraged to call the office with any questions, concerns, change in symptoms.

## 2022-04-11 ENCOUNTER — Encounter: Payer: Self-pay | Admitting: Hematology

## 2022-04-19 ENCOUNTER — Encounter: Payer: Self-pay | Admitting: Hematology

## 2022-04-19 ENCOUNTER — Encounter (INDEPENDENT_AMBULATORY_CARE_PROVIDER_SITE_OTHER): Payer: Medicare Other | Admitting: Ophthalmology

## 2022-04-19 DIAGNOSIS — H43813 Vitreous degeneration, bilateral: Secondary | ICD-10-CM

## 2022-04-19 DIAGNOSIS — H318 Other specified disorders of choroid: Secondary | ICD-10-CM | POA: Diagnosis not present

## 2022-04-19 DIAGNOSIS — B399 Histoplasmosis, unspecified: Secondary | ICD-10-CM | POA: Diagnosis not present

## 2022-04-22 ENCOUNTER — Ambulatory Visit (INDEPENDENT_AMBULATORY_CARE_PROVIDER_SITE_OTHER): Payer: Medicare Other | Admitting: Podiatry

## 2022-04-22 DIAGNOSIS — L600 Ingrowing nail: Secondary | ICD-10-CM | POA: Diagnosis not present

## 2022-04-22 NOTE — Progress Notes (Signed)
Subjective: Chief Complaint  Patient presents with   NAIL FOLLOW UP    RIGHT, GREAT HALLUX, PATIENT STATED SHE'S DOING FAIR     72 year old female presents the office today for follow up evaluation of ingrown toenail procedure on the right side.  She is doing "fair".  She has been on her feet more wearing shoes.  Also she had no reaction to the Betadine.  Her skin broke out she has been using steroid cream on this.  This is much better.  She denies any drainage or pus.  No fevers or chills.  Objective: AAO x3, NAD DP/PT pulses palpable bilaterally, CRT less than 3 seconds Status post nail avulsion of the right hallux.  Wound base is mostly granular wound with fibrotic tissue but overall looks healthy.  There is no drainage or pus.  No fluctuance or crepitation there is no malodor.  No exposed bone or tendon.  No malodor. No pain with calf compression, swelling, warmth, erythema  Assessment: Status post partial nail avulsion  Plan: -All treatment options discussed with the patient including all alternatives, risks, complications.  -Orders switched using Medihoney dressing changes daily which I gave her today. -Will hold off on soaking as it seems to irritate her.  Discussed washing with soap and water, dry thoroughly apply bandage.  Can leave the area open at night or around the house to hopefully allow it to heal better.  -Monitor for any clinical signs or symptoms of infection and directed to call the office immediately should any occur or go to the ER. -Patient encouraged to call the office with any questions, concerns, change in symptoms.   Return in about 3 weeks (around 05/13/2022) for nail check.  Trula Slade DPM

## 2022-04-26 ENCOUNTER — Encounter: Payer: Self-pay | Admitting: Hematology

## 2022-05-02 DIAGNOSIS — Z23 Encounter for immunization: Secondary | ICD-10-CM | POA: Diagnosis not present

## 2022-05-03 ENCOUNTER — Ambulatory Visit (INDEPENDENT_AMBULATORY_CARE_PROVIDER_SITE_OTHER): Payer: Medicare Other

## 2022-05-03 VITALS — Wt 178.0 lb

## 2022-05-03 DIAGNOSIS — Z0000 Encounter for general adult medical examination without abnormal findings: Secondary | ICD-10-CM | POA: Diagnosis not present

## 2022-05-03 DIAGNOSIS — Z122 Encounter for screening for malignant neoplasm of respiratory organs: Secondary | ICD-10-CM | POA: Diagnosis not present

## 2022-05-03 NOTE — Progress Notes (Deleted)
I connected with  Rodney Langton on 05/03/22 by a audio enabled telemedicine application and verified that I am speaking with the correct person using two identifiers.  Patient Location: Home  Provider Location: Office/Clinic  I discussed the limitations of evaluation and management by telemedicine. The patient expressed understanding and agreed to proceed.  Subjective:   Jodi Myers is a 72 y.o. female who presents for Medicare Annual (Subsequent) preventive examination.  Review of Systems     Cardiac Risk Factors include: advanced age (>55mn, >>66women);smoking/ tobacco exposure     Objective:    Today's Vitals   05/03/22 0832  Weight: 178 lb (80.7 kg)   Body mass index is 28.73 kg/m.     05/03/2022    8:36 AM 06/23/2021    8:48 AM 04/20/2021    8:33 AM 08/12/2020    8:14 AM 04/13/2020    8:12 AM 07/16/2019   12:19 PM 11/12/2018   10:29 AM  Advanced Directives  Does Patient Have a Medical Advance Directive? Yes Yes Yes No;Yes Yes Yes Yes  Type of AParamedicof APerryLiving will HMus[place]Living will Healthcare Power of AIdalouLiving will HZalmaLiving will HDoverLiving will HNorridgeLiving will  Does patient want to make changes to medical advance directive?    No - Patient declined  No - Patient declined No - Patient declined  Copy of HK[place]in Chart? No - copy requested  No - copy requested No - copy requested No - copy requested No - copy requested No - copy requested  Would patient like information on creating a medical advance directive?  No - Patient declined  No - Patient declined  No - Patient declined     Current Medications (verified) Outpatient Encounter Medications as of 05/03/2022  Medication Sig   acetaminophen (TYLENOL) 325 MG tablet Take 2 tablets (650 mg total) by mouth every 6 (six) hours as  needed for mild pain (or Fever >/= 101).   ASPIRIN LOW DOSE 81 MG EC tablet TAKE 1 TABLET BY MOUTH EVERY DAY   BESIVANCE 0.6 % SUSP Place 1 drop into the left eye See admin instructions. Instill 1 drop into left eye 4 times a day for 2 days after each monthly eye injection   CALCIUM PO Take 1 tablet by mouth daily.   cyanocobalamin (,VITAMIN B-12,) 1000 MCG/ML injection Inject 1 mL (1,000 mcg total) into the skin every 21 ( twenty-one) days.   pantoprazole (PROTONIX) 40 MG tablet TAKE 1 TABLET BY MOUTH EVERY DAY   VITAMIN D PO Take 1 tablet by mouth daily.   albuterol (VENTOLIN HFA) 108 (90 Base) MCG/ACT inhaler Inhale 2 puffs into the lungs every 6 (six) hours as needed for wheezing or shortness of breath. (Patient not taking: Reported on 05/03/2022)   [DISCONTINUED] cephALEXin (KEFLEX) 500 MG capsule Take 1 capsule (500 mg total) by mouth 3 (three) times daily.   [DISCONTINUED] hydrOXYzine (ATARAX) 10 MG tablet Take 1 tablet (10 mg total) by mouth 3 (three) times daily as needed.   [DISCONTINUED] triamcinolone ointment (KENALOG) 0.5 % Apply 1 Application topically 2 (two) times daily.   No facility-administered encounter medications on file as of 05/03/2022.    Allergies (verified) Betadine [povidone iodine], Mango flavor, Poison ivy extract, and Sporanox [itraconazole]   History: Past Medical History:  Diagnosis Date   Anemia 07/2018   bloood transfusion administered  Coronary artery disease    Past Surgical History:  Procedure Laterality Date   TONSILLECTOMY     Family History  Problem Relation Age of Onset   Diabetes Mellitus II Mother    Rheum arthritis Mother    CVA Mother    Diabetes Mellitus I Sister    Hashimoto's thyroiditis Sister    Heart attack Father    Colon cancer Neg Hx    Liver disease Neg Hx    Social History   Socioeconomic History   Marital status: Married    Spouse name: Not on file   Number of children: Not on file   Years of education: Not on  file   Highest education level: Not on file  Occupational History   Occupation: retired  Tobacco Use   Smoking status: Every Day    Packs/day: 1.50    Years: 49.00    Total pack years: 73.50    Types: Cigarettes   Smokeless tobacco: Never  Vaping Use   Vaping Use: Never used  Substance and Sexual Activity   Alcohol use: Yes    Alcohol/week: 7.0 standard drinks of alcohol    Types: 7 Cans of beer per week    Comment: a beer a day   Drug use: Never   Sexual activity: Not on file  Other Topics Concern   Not on file  Social History Narrative   Not on file   Social Determinants of Health   Financial Resource Strain: Low Risk  (04/20/2021)   Overall Financial Resource Strain (CARDIA)    Difficulty of Paying Living Expenses: Not hard at all  Food Insecurity: No Food Insecurity (04/20/2021)   Hunger Vital Sign    Worried About Running Out of Food in the Last Year: Never true    Ran Out of Food in the Last Year: Never true  Transportation Needs: No Transportation Needs (04/20/2021)   PRAPARE - Hydrologist (Medical): No    Lack of Transportation (Non-Medical): No  Physical Activity: Inactive (04/20/2021)   Exercise Vital Sign    Days of Exercise per Week: 0 days    Minutes of Exercise per Session: 0 min  Stress: No Stress Concern Present (04/20/2021)   [place]    Feeling of Stress : Not at all  Social Connections: Unknown (05/02/2022)   Social Connection and Isolation Panel [NHANES]    Frequency of Communication with Friends and Family: Not on file    Frequency of Social Gatherings with Friends and Family: Not on file    Attends Religious Services: Never    Marine scientist or Organizations: Not on file    Attends Archivist Meetings: Not on file    Marital Status: Not on file    Tobacco Counseling Ready to quit: Not Answered Counseling given: Not  Answered   Clinical Intake:  Pre-visit preparation completed: Yes  Pain : No/denies pain     BMI - recorded: 28.73 Nutritional Status: BMI 25 -29 Overweight Nutritional Risks: None Diabetes: No  How often do you need to have someone help you when you read instructions, pamphlets, or other written materials from your doctor or pharmacy?: 1 - Never  Diabetic?no  Interpreter Needed?: No  Information entered by :: Charlott Rakes, LPN   Activities of Daily Living    05/02/2022    9:45 AM  In your present state of health, do you have any difficulty performing  the following activities:  Hearing? 0  Vision? 0  Difficulty concentrating or making decisions? 0  Walking or climbing stairs? 0  Dressing or bathing? 0  Doing errands, shopping? 0  Preparing Food and eating ? N  Using the Toilet? N  In the past six months, have you accidently leaked urine? N  Do you have problems with loss of bowel control? N  Managing your Finances? N  Housekeeping or managing your Housekeeping? N    Patient Care Team: Inda Coke, [place] as PCP - General (Physician Assistant) Hayden Pedro, MD as Consulting Physician (Ophthalmology) Brunetta Genera, MD as Consulting Physician (Hematology)  Indicate any recent Medical Services you may have received from other than [facility] providers in the past year (date may be approximate).     Assessment:   This is a routine wellness examination for [place].  Hearing/Vision screen Hearing Screening - Comments:: Pt denies any hearing issues  Vision Screening - Comments:: Pt follows up with Dr Erline Levine for annual ey exams   Dietary issues and exercise activities discussed: Current Exercise Habits: The patient does not participate in regular exercise at present   Goals Addressed             This Visit's Progress    Patient Stated       None at this time        Depression Screen    05/03/2022    8:35 AM 04/20/2021    8:32 AM 04/13/2020     8:11 AM  PHQ 2/9 Scores  PHQ - 2 Score 0 0 0    Fall Risk    05/03/2022    8:37 AM 05/02/2022    9:45 AM 04/20/2021    8:33 AM 04/13/2020    8:13 AM 07/11/2019   10:04 AM  Fall Risk   Falls in the past year? 0 0 1 0 0  Number falls in past yr: 0 0 1 0 0  Injury with Fall? 0 0 1 0 0  Comment   knee    Risk for fall due to : Impaired vision  Impaired vision Impaired vision   Risk for fall due to: Comment    just had catarct surgery and wears reading glasses   Follow up Falls prevention discussed  Falls prevention discussed Falls prevention discussed     FALL RISK PREVENTION PERTAINING TO THE HOME:  Any stairs in or around the home? Yes  If so, are there any without handrails? No  Home free of loose throw rugs in walkways, pet beds, electrical cords, etc? Yes  Adequate lighting in your home to reduce risk of falls? Yes   ASSISTIVE DEVICES UTILIZED TO PREVENT FALLS:  Life alert? No  Use of a cane, walker or w/c? No  Grab bars in the bathroom? No  Shower chair or bench in shower? No  Elevated toilet seat or a handicapped toilet? No   TIMED UP AND GO:  Was the test performed? No .    Cognitive Function:        05/03/2022    8:39 AM 04/20/2021    8:36 AM 04/13/2020    8:16 AM  6CIT Screen  What Year? 0 points 0 points 0 points  What month? 0 points 0 points 0 points  What time? 0 points 0 points   Count back from 20 0 points 0 points 0 points  Months in reverse 0 points 0 points 0 points  Repeat phrase 0 points 0 points  0 points  Total Score 0 points 0 points     Immunizations Immunization History  Administered Date(s) Administered   Influenza, High Dose Seasonal PF 01/19/2017, 11/17/2018, 01/07/2021   Influenza,inj,Quad PF,6+ Mos 12/21/2019, 01/05/2022   Influenza-Unspecified 01/28/2014   PFIZER(Purple Top)SARS-COV-2 Vaccination 05/02/2019, 05/30/2019, 01/02/2020, 07/17/2020   PNEUMOCOCCAL CONJUGATE-20 05/02/2022   Pfizer Covid-19 Vaccine Bivalent Booster 82yr &  up 01/12/2021, 01/05/2022   Pneumococcal Conjugate-13 06/29/2015   Pneumococcal Polysaccharide-23 08/01/2016   Rsv, Mab, NKathie Rhodes 1 Ml, Neonate To 24 Mos(Beyfortus) 03/11/2022   Tdap 06/29/2015, 03/11/2022   Zoster Recombinat (Shingrix) 11/19/2018, 02/25/2019    TDAP status: Up to date  Flu Vaccine status: Up to date  Pneumococcal vaccine status: Up to date  Covid-19 vaccine status: Completed vaccines  Qualifies for Shingles Vaccine? Yes   Zostavax completed Yes   Shingrix Completed?: Yes  Screening Tests Health Maintenance  Topic Date Due   Hepatitis C Screening  Never done   COVID-19 Vaccine (7 - 2023-24 season) 03/02/2022   Lung Cancer Screening  03/09/2022   Medicare Annual Wellness (AWV)  05/04/2023   MAMMOGRAM  02/04/2024   DTaP/Tdap/Td (3 - Td or Tdap) 03/11/2032   Pneumonia Vaccine 72 Years old  Completed   INFLUENZA VACCINE  Completed   DEXA SCAN  Completed   Zoster Vaccines- Shingrix  Completed   HPV VACCINES  Aged Out   COLONOSCOPY (Pts [phone] coverage will need to be confirmed)  DiSpreckelsaintenance Due  Topic Date Due   Hepatitis C Screening  Never done   COVID-19 Vaccine (7 - 2023-24 season) 03/02/2022   Lung Cancer Screening  03/09/2022    Colorectal cancer screening: No longer required.   Mammogram status: Completed 02/03/22. Repeat every year  Bone Density status: Completed 12/06/17. Results reflect: Bone density results: OSTEOPOROSIS. Repeat every 2 years.  Lung Cancer Screening: (Low Dose CT Chest recommended if Age 73-80ears, 30 pack-year currently smoking OR have quit w/in 15years.) does qualify.   Lung Cancer Screening Referral: referral place 05/03/22  Additional Screening:  Hepatitis C Screening: does qualify;   Vision Screening: Recommended annual ophthalmology exams for early detection of glaucoma and other disorders of the eye. Is the patient up to date with their annual eye  exam?  Yes  Who is the provider or what is the name of the office in which the patient attends annual eye exams? Dr StCharlene BrookeIf pt is not established with a provider, would they like to be referred to a provider to establish care? No .   Dental Screening: Recommended annual dental exams for proper oral hygiene  Community Resource Referral / Chronic Care Management: CRR required this visit?  No   CCM required this visit?  No      Plan:     I have personally reviewed and noted the following in the patient's chart:   Medical and social history Use of alcohol, tobacco or illicit drugs  Current medications and supplements including opioid prescriptions. Patient is not currently taking opioid prescriptions. Functional ability and status Nutritional status Physical activity Advanced directives List of other physicians Hospitalizations, surgeries, and ER visits in previous 12 months Vitals Screenings to include cognitive, depression, and falls Referrals and appointments  In addition, I have reviewed and discussed with patient certain preventive protocols, quality metrics, and best practice recommendations. A written personalized care plan for preventive services as well as general preventive health recommendations were provided to patient.  [name], LPN   06/09/5692   Nurse Notes: none

## 2022-05-03 NOTE — Patient Instructions (Signed)
Jodi Myers , Thank you for taking time to come for your Medicare Wellness Visit. I appreciate your ongoing commitment to your health goals. Please review the following plan we discussed and let me know if I can assist you in the future.   These are the goals we discussed:  Goals      Patient Stated     None at this time     Patient Stated     None at this time      Patient Stated     None at this time         This is a list of the screening recommended for you and due dates:  Health Maintenance  Topic Date Due   Hepatitis C Screening: USPSTF Recommendation to screen - Ages 50-79 yo.  Never done   COVID-19 Vaccine (7 - 2023-24 season) 03/02/2022   Screening for Lung Cancer  03/09/2022   Medicare Annual Wellness Visit  05/04/2023   Mammogram  02/04/2024   DTaP/Tdap/Td vaccine (3 - Td or Tdap) 03/11/2032   Pneumonia Vaccine  Completed   Flu Shot  Completed   DEXA scan (bone density measurement)  Completed   Zoster (Shingles) Vaccine  Completed   HPV Vaccine  Aged Out   Colon Cancer Screening  Discontinued    Advanced directives: Please bring a copy of your health care power of attorney and living will to the office at your convenience.  Conditions/risks identified: none at this time   Next appointment: Follow up in one year for your annual wellness visit    Preventive Care 65 Years and Older, Female Preventive care refers to lifestyle choices and visits with your health care provider that can promote health and wellness. What does preventive care include? A yearly physical exam. This is also called an annual well check. Dental exams once or twice a year. Routine eye exams. Ask your health care provider how often you should have your eyes checked. Personal lifestyle choices, including: Daily care of your teeth and gums. Regular physical activity. Eating a healthy diet. Avoiding tobacco and drug use. Limiting alcohol use. Practicing safe sex. Taking low-dose aspirin  every day. Taking vitamin and mineral supplements as recommended by your health care provider. What happens during an annual well check? The services and screenings done by your health care provider during your annual well check will depend on your age, overall health, lifestyle risk factors, and family history of disease. Counseling  Your health care provider may ask you questions about your: Alcohol use. Tobacco use. Drug use. Emotional well-being. Home and relationship well-being. Sexual activity. Eating habits. History of falls. Memory and ability to understand (cognition). Work and work Statistician. Reproductive health. Screening  You may have the following tests or measurements: Height, weight, and BMI. Blood pressure. Lipid and cholesterol levels. These may be checked every 5 years, or more frequently if you are over 52 years old. Skin check. Lung cancer screening. You may have this screening every year starting at age 57 if you have a 30-pack-year history of smoking and currently smoke or have quit within the past 15 years. Fecal occult blood test (FOBT) of the stool. You may have this test every year starting at age 21. Flexible sigmoidoscopy or colonoscopy. You may have a sigmoidoscopy every 5 years or a colonoscopy every 10 years starting at age 40. Hepatitis C blood test. Hepatitis B blood test. Sexually transmitted disease (STD) testing. Diabetes screening. This is done by checking your blood  sugar (glucose) after you have not eaten for a while (fasting). You may have this done every 1-3 years. Bone density scan. This is done to screen for osteoporosis. You may have this done starting at age 28. Mammogram. This may be done every 1-2 years. Talk to your health care provider about how often you should have regular mammograms. Talk with your health care provider about your test results, treatment options, and if necessary, the need for more tests. Vaccines  Your health  care provider may recommend certain vaccines, such as: Influenza vaccine. This is recommended every year. Tetanus, diphtheria, and acellular pertussis (Tdap, Td) vaccine. You may need a Td booster every 10 years. Zoster vaccine. You may need this after age 67. Pneumococcal 13-valent conjugate (PCV13) vaccine. One dose is recommended after age 88. Pneumococcal polysaccharide (PPSV23) vaccine. One dose is recommended after age 76. Talk to your health care provider about which screenings and vaccines you need and how often you need them. This information is not intended to replace advice given to you by your health care provider. Make sure you discuss any questions you have with your health care provider. Document Released: 04/17/2015 Document Revised: 12/09/2015 Document Reviewed: 01/20/2015 Elsevier Interactive Patient Education  2017 [employer] Prevention in the Home Falls can cause injuries. They can happen to people of all ages. There are many things you can do to make your home safe and to help prevent falls. What can I do on the outside of my home? Regularly fix the edges of walkways and driveways and fix any cracks. Remove anything that might make you trip as you walk through a door, such as a raised step or threshold. Trim any bushes or trees on the path to your home. Use bright outdoor lighting. Clear any walking paths of anything that might make someone trip, such as rocks or tools. Regularly check to see if handrails are loose or broken. Make sure that both sides of any steps have handrails. Any raised decks and porches should have guardrails on the edges. Have any leaves, snow, or ice cleared regularly. Use sand or salt on walking paths during winter. Clean up any spills in your garage right away. This includes oil or grease spills. What can I do in the bathroom? Use night lights. Install grab bars by the toilet and in the tub and shower. Do not use towel bars as grab  bars. Use non-skid mats or decals in the tub or shower. If you need to sit down in the shower, use a plastic, non-slip stool. Keep the floor dry. Clean up any water that spills on the floor as soon as it happens. Remove soap buildup in the tub or shower regularly. Attach bath mats securely with double-sided non-slip rug tape. Do not have throw rugs and other things on the floor that can make you trip. What can I do in the bedroom? Use night lights. Make sure that you have a light by your bed that is easy to reach. Do not use any sheets or blankets that are too big for your bed. They should not hang down onto the floor. Have a firm chair that has side arms. You can use this for support while you get dressed. Do not have throw rugs and other things on the floor that can make you trip. What can I do in the kitchen? Clean up any spills right away. Avoid walking on wet floors. Keep items that you use a lot in easy-to-reach  places. If you need to reach something above you, use a strong step stool that has a grab bar. Keep electrical cords out of the way. Do not use floor polish or wax that makes floors slippery. If you must use wax, use non-skid floor wax. Do not have throw rugs and other things on the floor that can make you trip. What can I do with my stairs? Do not leave any items on the stairs. Make sure that there are handrails on both sides of the stairs and use them. Fix handrails that are broken or loose. Make sure that handrails are as long as the stairways. Check any carpeting to make sure that it is firmly attached to the stairs. Fix any carpet that is loose or worn. Avoid having throw rugs at the top or bottom of the stairs. If you do have throw rugs, attach them to the floor with carpet tape. Make sure that you have a light switch at the top of the stairs and the bottom of the stairs. If you do not have them, ask someone to add them for you. What else can I do to help prevent  falls? Wear shoes that: Do not have high heels. Have rubber bottoms. Are comfortable and fit you well. Are closed at the toe. Do not wear sandals. If you use a stepladder: Make sure that it is fully opened. Do not climb a closed stepladder. Make sure that both sides of the stepladder are locked into place. Ask someone to hold it for you, if possible. Clearly mark and make sure that you can see: Any grab bars or handrails. First and last steps. Where the edge of each step is. Use tools that help you move around (mobility aids) if they are needed. These include: Canes. Walkers. Scooters. Crutches. Turn on the lights when you go into a dark area. Replace any light bulbs as soon as they burn out. Set up your furniture so you have a clear path. Avoid moving your furniture around. If any of your floors are uneven, fix them. If there are any pets around you, be aware of where they are. Review your medicines with your doctor. Some medicines can make you feel dizzy. This can increase your chance of falling. Ask your doctor what other things that you can do to help prevent falls. This information is not intended to replace advice given to you by your health care provider. Make sure you discuss any questions you have with your health care provider. Document Released: 01/15/2009 Document Revised: 08/27/2015 Document Reviewed: 04/25/2014 Elsevier Interactive Patient Education  2017 [employer].

## 2022-05-06 ENCOUNTER — Other Ambulatory Visit: Payer: Self-pay

## 2022-05-06 DIAGNOSIS — D509 Iron deficiency anemia, unspecified: Secondary | ICD-10-CM

## 2022-05-09 ENCOUNTER — Inpatient Hospital Stay: Payer: Medicare Other | Attending: Hematology

## 2022-05-09 DIAGNOSIS — E611 Iron deficiency: Secondary | ICD-10-CM | POA: Insufficient documentation

## 2022-05-09 DIAGNOSIS — F17210 Nicotine dependence, cigarettes, uncomplicated: Secondary | ICD-10-CM | POA: Insufficient documentation

## 2022-05-09 DIAGNOSIS — Z833 Family history of diabetes mellitus: Secondary | ICD-10-CM | POA: Diagnosis not present

## 2022-05-09 DIAGNOSIS — Z86718 Personal history of other venous thrombosis and embolism: Secondary | ICD-10-CM | POA: Diagnosis not present

## 2022-05-09 DIAGNOSIS — D510 Vitamin B12 deficiency anemia due to intrinsic factor deficiency: Secondary | ICD-10-CM | POA: Diagnosis not present

## 2022-05-09 DIAGNOSIS — Z8249 Family history of ischemic heart disease and other diseases of the circulatory system: Secondary | ICD-10-CM | POA: Diagnosis not present

## 2022-05-09 DIAGNOSIS — Z7982 Long term (current) use of aspirin: Secondary | ICD-10-CM | POA: Insufficient documentation

## 2022-05-09 DIAGNOSIS — D509 Iron deficiency anemia, unspecified: Secondary | ICD-10-CM

## 2022-05-09 LAB — IRON AND IRON BINDING CAPACITY (CC-WL,HP ONLY)
Iron: 74 ug/dL (ref 28–170)
Saturation Ratios: 20 % (ref 10.4–31.8)
TIBC: 363 ug/dL (ref 250–450)
UIBC: 289 ug/dL (ref 148–442)

## 2022-05-09 LAB — CMP (CANCER CENTER ONLY)
ALT: 11 U/L (ref 0–44)
AST: 16 U/L (ref 15–41)
Albumin: 3.8 g/dL (ref 3.5–5.0)
Alkaline Phosphatase: 113 U/L (ref 38–126)
Anion gap: 9 (ref 5–15)
BUN: 23 mg/dL (ref 8–23)
CO2: 28 mmol/L (ref 22–32)
Calcium: 10 mg/dL (ref 8.9–10.3)
Chloride: 100 mmol/L (ref 98–111)
Creatinine: 1.04 mg/dL — ABNORMAL HIGH (ref 0.44–1.00)
GFR, Estimated: 57 mL/min — ABNORMAL LOW (ref >60)
Glucose, Bld: 92 mg/dL (ref 70–99)
Potassium: 3.9 mmol/L (ref 3.5–5.1)
Sodium: 137 mmol/L (ref 135–145)
Total Bilirubin: 0.4 mg/dL (ref 0.3–1.2)
Total Protein: 7 g/dL (ref 6.5–8.1)

## 2022-05-09 LAB — CBC WITH DIFFERENTIAL (CANCER CENTER ONLY)
Abs Immature Granulocytes: 0.03 10*3/uL (ref 0.00–0.07)
Basophils Absolute: 0.1 10*3/uL (ref 0.0–0.1)
Basophils Relative: 1 %
Eosinophils Absolute: 0.2 10*3/uL (ref 0.0–0.5)
Eosinophils Relative: 2 %
HCT: 48.3 % — ABNORMAL HIGH (ref 36.0–46.0)
Hemoglobin: 16.3 g/dL — ABNORMAL HIGH (ref 12.0–15.0)
Immature Granulocytes: 0 %
Lymphocytes Relative: 20 %
Lymphs Abs: 1.5 10*3/uL (ref 0.7–4.0)
MCH: 31 pg (ref 26.0–34.0)
MCHC: 33.7 g/dL (ref 30.0–36.0)
MCV: 92 fL (ref 80.0–100.0)
Monocytes Absolute: 0.5 10*3/uL (ref 0.1–1.0)
Monocytes Relative: 7 %
Neutro Abs: 5.3 10*3/uL (ref 1.7–7.7)
Neutrophils Relative %: 70 %
Platelet Count: 184 10*3/uL (ref 150–400)
RBC: 5.25 MIL/uL — ABNORMAL HIGH (ref 3.87–5.11)
RDW: 13.7 % (ref 11.5–15.5)
WBC Count: 7.5 10*3/uL (ref 4.0–10.5)
nRBC: 0 % (ref 0.0–0.2)

## 2022-05-09 LAB — FERRITIN: Ferritin: 32 ng/mL (ref 11–307)

## 2022-05-09 LAB — VITAMIN B12: Vitamin B-12: 424 pg/mL (ref 180–914)

## 2022-05-10 ENCOUNTER — Ambulatory Visit: Payer: Medicare Other | Admitting: Podiatry

## 2022-05-11 ENCOUNTER — Inpatient Hospital Stay (HOSPITAL_BASED_OUTPATIENT_CLINIC_OR_DEPARTMENT_OTHER): Payer: Medicare Other | Admitting: Hematology

## 2022-05-11 VITALS — BP 132/67 | HR 73 | Temp 97.0°F | Resp 18 | Wt 183.1 lb

## 2022-05-11 DIAGNOSIS — E611 Iron deficiency: Secondary | ICD-10-CM | POA: Diagnosis not present

## 2022-05-11 DIAGNOSIS — D510 Vitamin B12 deficiency anemia due to intrinsic factor deficiency: Secondary | ICD-10-CM | POA: Diagnosis not present

## 2022-05-11 DIAGNOSIS — Z8249 Family history of ischemic heart disease and other diseases of the circulatory system: Secondary | ICD-10-CM | POA: Diagnosis not present

## 2022-05-11 DIAGNOSIS — Z86718 Personal history of other venous thrombosis and embolism: Secondary | ICD-10-CM | POA: Diagnosis not present

## 2022-05-11 DIAGNOSIS — Z7982 Long term (current) use of aspirin: Secondary | ICD-10-CM

## 2022-05-11 DIAGNOSIS — D509 Iron deficiency anemia, unspecified: Secondary | ICD-10-CM

## 2022-05-11 DIAGNOSIS — E538 Deficiency of other specified B group vitamins: Secondary | ICD-10-CM

## 2022-05-11 DIAGNOSIS — F17210 Nicotine dependence, cigarettes, uncomplicated: Secondary | ICD-10-CM | POA: Diagnosis not present

## 2022-05-11 NOTE — Progress Notes (Signed)
HEMATOLOGY/ONCOLOGY CLINIC NOTE  Date of Service: 05/11/22    Patient Care Team: Inda Coke, [place] as PCP - General (Physician Assistant) Hayden Pedro, MD as Consulting Physician (Ophthalmology) Brunetta Genera, MD as Consulting Physician (Hematology)  CHIEF COMPLAINTS/PURPOSE OF CONSULTATION:  Follow-up for pernicious anemia causing B12 and iron deficiency  HISTORY OF PRESENTING ILLNESS:  Please see previous note for details on initial presentation.  Interval History:  Jodi Myers is here for her scheduled follow-up for continued evaluation and management of pernicious anemia.  Patient was last seen by me on 06/08/2021 and she was doing well overall.  Patient reports she has been doing well overall without any new medical concerns. She has been taking Vitamin B-12 shots every 3 week and is tolerating it well. She denies taking iron supplements.   She is still smoking cigarettes, around a pack and half.   She denies fever, chills, night sweats, fatigue, shortness of breath, back pain, chest pain, or leg swelling. She does complain of bilateral shoulder pain and neck pain. She is taking ibuprofen for her pain.   Patient notes she does not drink water as much as she needs to. She drinks sweet tea.     MEDICAL HISTORY:  Past Medical History:  Diagnosis Date   Anemia 07/2018   bloood transfusion administered    Coronary artery disease     SURGICAL HISTORY: Past Surgical History:  Procedure Laterality Date   TONSILLECTOMY      SOCIAL HISTORY: Social History   Socioeconomic History   Marital status: Married    Spouse name: Not on file   Number of children: Not on file   Years of education: Not on file   Highest education level: Not on file  Occupational History   Occupation: retired  Tobacco Use   Smoking status: Every Day    Packs/day: 1.50    Years: 49.00    Total pack years: 73.50    Types: Cigarettes   Smokeless tobacco: Never  Vaping  Use   Vaping Use: Never used  Substance and Sexual Activity   Alcohol use: Yes    Alcohol/week: 7.0 standard drinks of alcohol    Types: 7 Cans of beer per week    Comment: a beer a day   Drug use: Never   Sexual activity: Not on file  Other Topics Concern   Not on file  Social History Narrative   Not on file   Social Determinants of Health   Financial Resource Strain: Low Risk  (04/20/2021)   Overall Financial Resource Strain (CARDIA)    Difficulty of Paying Living Expenses: Not hard at all  Food Insecurity: No Food Insecurity (04/20/2021)   Hunger Vital Sign    Worried About Running Out of Food in the Last Year: Never true    Ran Out of Food in the Last Year: Never true  Transportation Needs: No Transportation Needs (04/20/2021)   PRAPARE - Hydrologist (Medical): No    Lack of Transportation (Non-Medical): No  Physical Activity: Inactive (04/20/2021)   Exercise Vital Sign    Days of Exercise per Week: 0 days    Minutes of Exercise per Session: 0 min  Stress: No Stress Concern Present (04/20/2021)   [place]    Feeling of Stress : Not at all  Social Connections: Unknown (05/02/2022)   Social Connection and Isolation Panel [NHANES]    Frequency of Communication  with Friends and Family: Not on file    Frequency of Social Gatherings with Friends and Family: Not on file    Attends Religious Services: Never    Active Member of Clubs or Organizations: Not on file    Attends Archivist Meetings: Not on file    Marital Status: Not on file  Intimate Partner Violence: Not At Risk (05/03/2022)   Humiliation, Afraid, Rape, and Kick questionnaire    Fear of Current or Ex-Partner: No    Emotionally Abused: No    Physically Abused: No    Sexually Abused: No    FAMILY HISTORY: Family History  Problem Relation Age of Onset   Diabetes Mellitus II Mother    Rheum arthritis Mother     CVA Mother    Diabetes Mellitus I Sister    Hashimoto's thyroiditis Sister    Heart attack Father    Colon cancer Neg Hx    Liver disease Neg Hx     ALLERGIES:  is allergic to betadine [povidone iodine], mango flavor, poison ivy extract, and sporanox [itraconazole].  MEDICATIONS:  Current Outpatient Medications  Medication Sig Dispense Refill   acetaminophen (TYLENOL) 325 MG tablet Take 2 tablets (650 mg total) by mouth every 6 (six) hours as needed for mild pain (or Fever >/= 101). 20 tablet 0   albuterol (VENTOLIN HFA) 108 (90 Base) MCG/ACT inhaler Inhale 2 puffs into the lungs every 6 (six) hours as needed for wheezing or shortness of breath. (Patient not taking: Reported on 05/03/2022) 8 g 0   ASPIRIN LOW DOSE 81 MG EC tablet TAKE 1 TABLET BY MOUTH EVERY DAY 90 tablet 3   BESIVANCE 0.6 % SUSP Place 1 drop into the left eye See admin instructions. Instill 1 drop into left eye 4 times a day for 2 days after each monthly eye injection     CALCIUM PO Take 1 tablet by mouth daily.     cyanocobalamin (,VITAMIN B-12,) 1000 MCG/ML injection Inject 1 mL (1,000 mcg total) into the skin every 21 ( twenty-one) days. 12 mL 3   pantoprazole (PROTONIX) 40 MG tablet TAKE 1 TABLET BY MOUTH EVERY DAY 90 tablet 0   VITAMIN D PO Take 1 tablet by mouth daily.     No current facility-administered medications for this visit.    REVIEW OF SYSTEMS:   10 Point review of Systems was done is negative except as noted above.  PHYSICAL EXAMINATION:  Vitals:   05/11/22 0910  BP: 132/67  Pulse: 73  Resp: 18  Temp: (!) 97 F (36.1 C)    Filed Weights   05/11/22 0910  Weight: 183 lb 1.6 oz (83.1 kg)    .Body mass index is 29.55 kg/m.  NAD GENERAL:alert, in no acute distress and comfortable SKIN: no acute rashes, no significant lesions EYES: conjunctiva are pink and non-injected, sclera anicteric OROPHARYNX: MMM, no exudates, no oropharyngeal erythema or ulceration NECK: supple, no JVD LYMPH:   no palpable lymphadenopathy in the cervical, axillary or inguinal regions LUNGS: clear to auscultation b/l with normal respiratory effort HEART: regular rate & rhythm ABDOMEN:  normoactive bowel sounds , non tender, not distended. Extremity: no pedal edema PSYCH: alert & oriented x 3 with fluent speech NEURO: no focal motor/sensory deficits  LABORATORY DATA:  I have reviewed the data as listed  .    Latest Ref Rng & Units 05/09/2022    9:55 AM 02/11/2022    3:48 PM 06/08/2021    9:18 AM  CBC  WBC 4.0 - 10.5 K/uL 7.5  7.5  7.1   Hemoglobin 12.0 - 15.0 g/dL 16.3  16.9  15.8   Hematocrit 36.0 - 46.0 % 48.3  48.3  47.2   Platelets 150 - 400 K/uL 184  156  164        Latest Ref Rng & Units 05/09/2022    9:55 AM 02/11/2022    3:48 PM 06/08/2021    9:18 AM  CMP  Glucose 70 - 99 mg/dL 92  110  91   BUN 8 - 23 mg/dL 23  22  26   $ Creatinine 0.44 - 1.00 mg/dL 1.04  1.00  0.92   Sodium 135 - 145 mmol/L 137  140  137   Potassium 3.5 - 5.1 mmol/L 3.9  3.7  4.0   Chloride 98 - 111 mmol/L 100  102  100   CO2 22 - 32 mmol/L 28  27  32   Calcium 8.9 - 10.3 mg/dL 10.0  9.6  9.9   Total Protein 6.5 - 8.1 g/dL 7.0  7.5  7.6   Total Bilirubin 0.3 - 1.2 mg/dL 0.4  0.5  0.5   Alkaline Phos 38 - 126 U/L 113   97   AST 15 - 41 U/L 16  15  18   $ ALT 0 - 44 U/L 11  14  15    $ Intrinsic Factor     0.0 - 1.1 AU/mL  18.2 (H)  Parietal Cell Antibody-IgG     0.0 - 20.0 Units  70.1 (H)   . Lab Results  Component Value Date   IRON 74 05/09/2022   TIBC 363 05/09/2022   IRONPCTSAT 20 05/09/2022   (Iron and TIBC)  Lab Results  Component Value Date   FERRITIN 32 05/09/2022   B12 --- 289  RADIOGRAPHIC STUDIES:  I have personally reviewed the radiological images as listed and agreed with the findings in the report. No results found.  ASSESSMENT & PLAN:   72 y.o. female with  1. S/p Pancytopenia likely due to severe B12 deficiency- now resolved. Patient's labs upon initial presentation  01/23/18, Vitamin B12 low at <50. S/p blood transfusion the pt's HGB increased from 4.9 to 8.6.   Anemia a combination of pernicious anemia causing B12 and iron deficiency, and a concern of GI bleeding  2. Severe B12 deficiency due to pernicious anemia +ve parietal cell and Intrinsic factor antibodies. 01/24/18 anti-parietal antibody and intrinsic factor antibody are both elevated consistent with pernicious anemia.   3. H/o DVT rt posterior tibial veins. Likely triggering event - 2 packs/day of cigarette smoking.  01/23/18 VAS US revealed  Right: findings consistent with acute DVT involving the right posterior tibial vein  S/p 6 months of Xarelto Plan -conitnue ASA 2m po daily  4. Iron deficiency  PLAN: -Discussed lab results from 05/09/2022 with the patient. CBC shows slightly elevated hemoglobin of 16.3 and slightly elevated hematocrit of 48.3. CMP is stable. Ferritin is normal around 32. Vitamin B-12 is normal around 424. Patient is mildly iron deficient but not anemic.  -Educated the patient that ibuprofen is not preferred due to being a risk factor for bleeding issues. -Recommended to eat iron rich foods.  -Recommended to take tylenol for pain instead of ibuprofen.  -We will watch iron levels in 6 months.  -recommended to follow-up with PCP regarding cancer screening.    FOLLOW-UP: Phone visit with Dr KIrene Limboin 6 months Labs 1-2 days prior to phone visit  The total time spent in the appointment was 20 minutes* .  All of the patient's questions were answered with apparent satisfaction. The patient knows to call the clinic with any problems, questions or concerns.   Sullivan Lone MD MS AAHIVMS [facility] Hematology/Oncology Physician [facility]  .*Total Encounter Time as defined by the Centers for Medicare and Medicaid Services includes, in addition to the face-to-face time of a patient visit (documented in the note above) non-face-to-face time: obtaining and  reviewing outside history, ordering and reviewing medications, tests or procedures, care coordination (communications with other health care professionals or caregivers) and documentation in the medical record.   I, Cleda Mccreedy, am acting as a Education administrator for Sullivan Lone, MD.  .I have reviewed the above documentation for accuracy and completeness, and I agree with the above. Brunetta Genera MD

## 2022-05-11 NOTE — Progress Notes (Signed)
I connected with  Rodney Langton on 05/11/22 by a audio enabled telemedicine application and verified that I am speaking with the correct person using two identifiers.  Patient Location: Home  Provider Location: Office/Clinic  I discussed the limitations of evaluation and management by telemedicine. The patient expressed understanding and agreed to proceed.  Subjective:   Jodi Myers is a 72 y.o. female who presents for Medicare Annual (Subsequent) preventive examination.  Review of Systems     Cardiac Risk Factors include: advanced age (>51mn, >>27women);smoking/ tobacco exposure     Objective:    Today's Vitals   05/03/22 0832  Weight: 178 lb (80.7 kg)   Body mass index is 28.73 kg/m.     05/03/2022    8:36 AM 06/23/2021    8:48 AM 04/20/2021    8:33 AM 08/12/2020    8:14 AM 04/13/2020    8:12 AM 07/16/2019   12:19 PM 11/12/2018   10:29 AM  Advanced Directives  Does Patient Have a Medical Advance Directive? Yes Yes Yes No;Yes Yes Yes Yes  Type of AParamedicof AR[place]Living will [place]Living will Healthcare Power of AR[place]Living will HSewickley HeightsLiving will HMariano ColonLiving will HStarkeLiving will  Does patient want to make changes to medical advance directive?    No - Patient declined  No - Patient declined No - Patient declined  Copy of HPittman Centerin Chart? No - copy requested  No - copy requested No - copy requested No - copy requested No - copy requested No - copy requested  Would patient like information on creating a medical advance directive?  No - Patient declined  No - Patient declined  No - Patient declined     Current Medications (verified) Outpatient Encounter Medications as of 05/03/2022  Medication Sig   acetaminophen (TYLENOL) 325 MG tablet Take 2 tablets (650 mg total) by mouth every 6 (six) hours as  needed for mild pain (or Fever >/= 101).   ASPIRIN LOW DOSE 81 MG EC tablet TAKE 1 TABLET BY MOUTH EVERY DAY   BESIVANCE 0.6 % SUSP Place 1 drop into the left eye See admin instructions. Instill 1 drop into left eye 4 times a day for 2 days after each monthly eye injection   CALCIUM PO Take 1 tablet by mouth daily.   cyanocobalamin (,VITAMIN B-12,) 1000 MCG/ML injection Inject 1 mL (1,000 mcg total) into the skin every 21 ( twenty-one) days.   pantoprazole (PROTONIX) 40 MG tablet TAKE 1 TABLET BY MOUTH EVERY DAY   VITAMIN D PO Take 1 tablet by mouth daily.   albuterol (VENTOLIN HFA) 108 (90 Base) MCG/ACT inhaler Inhale 2 puffs into the lungs every 6 (six) hours as needed for wheezing or shortness of breath. (Patient not taking: Reported on 05/03/2022)   [DISCONTINUED] cephALEXin (KEFLEX) 500 MG capsule Take 1 capsule (500 mg total) by mouth 3 (three) times daily.   [DISCONTINUED] hydrOXYzine (ATARAX) 10 MG tablet Take 1 tablet (10 mg total) by mouth 3 (three) times daily as needed.   [DISCONTINUED] triamcinolone ointment (KENALOG) 0.5 % Apply 1 Application topically 2 (two) times daily.   No facility-administered encounter medications on file as of 05/03/2022.    Allergies (verified) Betadine [povidone iodine], Mango flavor, Poison ivy extract, and Sporanox [itraconazole]   History: Past Medical History:  Diagnosis Date   Anemia 07/2018   bloood transfusion administered  Coronary artery disease    Past Surgical History:  Procedure Laterality Date   TONSILLECTOMY     Family History  Problem Relation Age of Onset   Diabetes Mellitus II Mother    Rheum arthritis Mother    CVA Mother    Diabetes Mellitus I Sister    Hashimoto's thyroiditis Sister    Heart attack Father    Colon cancer Neg Hx    Liver disease Neg Hx    Social History   Socioeconomic History   Marital status: Married    Spouse name: Not on file   Number of children: Not on file   Years of education: Not on  file   Highest education level: Not on file  Occupational History   Occupation: retired  Tobacco Use   Smoking status: Every Day    Packs/day: 1.50    Years: 49.00    Total pack years: 73.50    Types: Cigarettes   Smokeless tobacco: Never  Vaping Use   Vaping Use: Never used  Substance and Sexual Activity   Alcohol use: Yes    Alcohol/week: 7.0 standard drinks of alcohol    Types: 7 Cans of beer per week    Comment: a beer a day   Drug use: Never   Sexual activity: Not on file  Other Topics Concern   Not on file  Social History Narrative   Not on file   Social Determinants of Health   Financial Resource Strain: Low Risk  (04/20/2021)   Overall Financial Resource Strain (CARDIA)    Difficulty of Paying Living Expenses: Not hard at all  Food Insecurity: No Food Insecurity (04/20/2021)   Hunger Vital Sign    Worried About Running Out of Food in the Last Year: Never true    Ran Out of Food in the Last Year: Never true  Transportation Needs: No Transportation Needs (04/20/2021)   PRAPARE - Hydrologist (Medical): No    Lack of Transportation (Non-Medical): No  Physical Activity: Inactive (04/20/2021)   Exercise Vital Sign    Days of Exercise per Week: 0 days    Minutes of Exercise per Session: 0 min  Stress: No Stress Concern Present (04/20/2021)   [place]    Feeling of Stress : Not at all  Social Connections: Unknown (05/02/2022)   Social Connection and Isolation Panel [NHANES]    Frequency of Communication with Friends and Family: Not on file    Frequency of Social Gatherings with Friends and Family: Not on file    Attends Religious Services: Never    Marine scientist or Organizations: Not on file    Attends Archivist Meetings: Not on file    Marital Status: Not on file    Tobacco Counseling Ready to quit: Not Answered Counseling given: Not  Answered   Clinical Intake:  Pre-visit preparation completed: Yes  Pain : No/denies pain     BMI - recorded: 28.73 Nutritional Status: BMI 25 -29 Overweight Nutritional Risks: None Diabetes: No  How often do you need to have someone help you when you read instructions, pamphlets, or other written materials from your doctor or pharmacy?: 1 - Never  Diabetic?no  Interpreter Needed?: No  Information entered by :: Charlott Rakes, LPN (no changes at time of service)   Activities of Daily Living    05/02/2022    9:45 AM  In your present state of health,  do you have any difficulty performing the following activities:  Hearing? 0  Vision? 0  Difficulty concentrating or making decisions? 0  Walking or climbing stairs? 0  Dressing or bathing? 0  Doing errands, shopping? 0  Preparing Food and eating ? N  Using the Toilet? N  In the past six months, have you accidently leaked urine? N  Do you have problems with loss of bowel control? N  Managing your Finances? N  Housekeeping or managing your Housekeeping? N    Patient Care Team: Inda Coke, [place] as PCP - General (Physician Assistant) Hayden Pedro, MD as Consulting Physician (Ophthalmology) Brunetta Genera, MD as Consulting Physician (Hematology)  Indicate any recent Medical Services you may have received from other than [facility] providers in the past year (date may be approximate).     Assessment:   This is a routine wellness examination for Andrews.  Hearing/Vision screen Hearing Screening - Comments:: Pt denies any hearing issues  Vision Screening - Comments:: Pt follows up with Dr Erline Levine for annual ey exams   Dietary issues and exercise activities discussed: Current Exercise Habits: The patient does not participate in regular exercise at present   Goals Addressed             This Visit's Progress    Patient Stated       None at this time       Depression Screen    05/03/2022    8:35 AM  04/20/2021    8:32 AM 04/13/2020    8:11 AM  PHQ 2/9 Scores  PHQ - 2 Score 0 0 0    Fall Risk    05/03/2022    8:37 AM 05/02/2022    9:45 AM 04/20/2021    8:33 AM 04/13/2020    8:13 AM 07/11/2019   10:04 AM  Fall Risk   Falls in the past year? 0 0 1 0 0  Number falls in past yr: 0 0 1 0 0  Injury with Fall? 0 0 1 0 0  Comment   knee    Risk for fall due to : Impaired vision  Impaired vision Impaired vision   Risk for fall due to: Comment    just had catarct surgery and wears reading glasses   Follow up Falls prevention discussed  Falls prevention discussed Falls prevention discussed     FALL RISK PREVENTION PERTAINING TO THE HOME:  Any stairs in or around the home? Yes  If so, are there any without handrails? No  Home free of loose throw rugs in walkways, pet beds, electrical cords, etc? Yes  Adequate lighting in your home to reduce risk of falls? Yes   ASSISTIVE DEVICES UTILIZED TO PREVENT FALLS:  Life alert? No  Use of a cane, walker or w/c? No  Grab bars in the bathroom? No  Shower chair or bench in shower? No  Elevated toilet seat or a handicapped toilet? No   TIMED UP AND GO:  Was the test performed? No .    Cognitive Function:        05/03/2022    8:39 AM 04/20/2021    8:36 AM 04/13/2020    8:16 AM  6CIT Screen  What Year? 0 points 0 points 0 points  What month? 0 points 0 points 0 points  What time? 0 points 0 points   Count back from 20 0 points 0 points 0 points  Months in reverse 0 points 0 points 0 points  Repeat  phrase 0 points 0 points 0 points  Total Score 0 points 0 points     Immunizations Immunization History  Administered Date(s) Administered   Influenza, High Dose Seasonal PF 01/19/2017, 11/17/2018, 01/07/2021   Influenza,inj,Quad PF,6+ Mos 12/21/2019, 01/05/2022   Influenza-Unspecified 01/28/2014   PFIZER(Purple Top)SARS-COV-2 Vaccination 05/02/2019, 05/30/2019, 01/02/2020, 07/17/2020   PNEUMOCOCCAL CONJUGATE-20 05/02/2022   Pfizer  Covid-19 Vaccine Bivalent Booster 52yr & up 01/12/2021, 01/05/2022   Pneumococcal Conjugate-13 06/29/2015   Pneumococcal Polysaccharide-23 08/01/2016   Rsv, Mab, NKathie Rhodes 1 Ml, Neonate To 24 Mos(Beyfortus) 03/11/2022   Tdap 06/29/2015, 03/11/2022   Zoster Recombinat (Shingrix) 11/19/2018, 02/25/2019    TDAP status: Up to date  Flu Vaccine status: Up to date  Pneumococcal vaccine status: Up to date  Covid-19 vaccine status: Completed vaccines  Qualifies for Shingles Vaccine? Yes   Zostavax completed Yes   Shingrix Completed?: Yes  Screening Tests Health Maintenance  Topic Date Due   Hepatitis C Screening  Never done   COVID-19 Vaccine (7 - 2023-24 season) 03/02/2022   Lung Cancer Screening  03/09/2022   Medicare Annual Wellness (AWV)  05/04/2023   MAMMOGRAM  02/04/2024   DTaP/Tdap/Td (3 - Td or Tdap) 03/11/2032   Pneumonia Vaccine 72 Years old  Completed   INFLUENZA VACCINE  Completed   DEXA SCAN  Completed   Zoster Vaccines- Shingrix  Completed   HPV VACCINES  Aged Out   COLONOSCOPY (Pts [phone] coverage will need to be confirmed)  [place] Due  Topic Date Due   Hepatitis C Screening  Never done   COVID-19 Vaccine (7 - 2023-24 season) 03/02/2022   Lung Cancer Screening  03/09/2022    Colorectal cancer screening: No longer required.   Mammogram status: Completed 02/03/22. Repeat every year  Bone Density status: Completed 12/06/17. Results reflect: Bone density results: OSTEOPOROSIS. Repeat every 2 years.  Lung Cancer Screening: (Low Dose CT Chest recommended if Age 533-80ears, 30 pack-year currently smoking OR have quit w/in 15years.) does qualify.   Lung Cancer Screening Referral: referral place 05/03/22  Additional Screening:  Hepatitis C Screening: does qualify;   Vision Screening: Recommended annual ophthalmology exams for early detection of glaucoma and other disorders of the eye. Is the  patient up to date with their annual eye exam?  Yes  Who is the provider or what is the name of the office in which the patient attends annual eye exams? Dr StCharlene BrookeIf pt is not established with a provider, would they like to be referred to a provider to establish care? No .   Dental Screening: Recommended annual dental exams for proper oral hygiene  Community Resource Referral / Chronic Care Management: CRR required this visit?  No   CCM required this visit?  No      Plan:     I have personally reviewed and noted the following in the patient's chart:   Medical and social history Use of alcohol, tobacco or illicit drugs  Current medications and supplements including opioid prescriptions. Patient is not currently taking opioid prescriptions. Functional ability and status Nutritional status Physical activity Advanced directives List of other physicians Hospitalizations, surgeries, and ER visits in previous 12 months Vitals Screenings to include cognitive, depression, and falls Referrals and appointments  In addition, I have reviewed and discussed with patient certain preventive protocols, quality metrics, and best practice recommendations. A written personalized care plan for preventive services as well as general preventive health recommendations  were provided to patient.     [name], LPN   07/05/3534   Nurse Notes:   Patient Medicare AWV questionnaire was completed by the patient on 05/02/22; I have confirmed that all information answered by patient is correct and no changes since this date.

## 2022-05-16 ENCOUNTER — Other Ambulatory Visit: Payer: Self-pay | Admitting: Physician Assistant

## 2022-05-17 ENCOUNTER — Encounter: Payer: Self-pay | Admitting: Hematology

## 2022-06-15 DIAGNOSIS — Z23 Encounter for immunization: Secondary | ICD-10-CM | POA: Diagnosis not present

## 2022-06-21 ENCOUNTER — Encounter (INDEPENDENT_AMBULATORY_CARE_PROVIDER_SITE_OTHER): Payer: Self-pay

## 2022-06-21 ENCOUNTER — Encounter (INDEPENDENT_AMBULATORY_CARE_PROVIDER_SITE_OTHER): Payer: Medicare Other | Admitting: Ophthalmology

## 2022-06-22 ENCOUNTER — Encounter (INDEPENDENT_AMBULATORY_CARE_PROVIDER_SITE_OTHER): Payer: Medicare Other | Admitting: Ophthalmology

## 2022-06-22 DIAGNOSIS — H32 Chorioretinal disorders in diseases classified elsewhere: Secondary | ICD-10-CM | POA: Diagnosis not present

## 2022-06-22 DIAGNOSIS — B399 Histoplasmosis, unspecified: Secondary | ICD-10-CM

## 2022-06-22 DIAGNOSIS — H43813 Vitreous degeneration, bilateral: Secondary | ICD-10-CM

## 2022-06-22 DIAGNOSIS — H318 Other specified disorders of choroid: Secondary | ICD-10-CM

## 2022-07-15 ENCOUNTER — Telehealth: Payer: Self-pay | Admitting: Physician Assistant

## 2022-07-15 DIAGNOSIS — Z720 Tobacco use: Secondary | ICD-10-CM

## 2022-07-15 DIAGNOSIS — Z122 Encounter for screening for malignant neoplasm of respiratory organs: Secondary | ICD-10-CM

## 2022-07-15 DIAGNOSIS — R911 Solitary pulmonary nodule: Secondary | ICD-10-CM

## 2022-07-15 DIAGNOSIS — Z87891 Personal history of nicotine dependence: Secondary | ICD-10-CM

## 2022-07-15 DIAGNOSIS — J439 Emphysema, unspecified: Secondary | ICD-10-CM

## 2022-07-15 NOTE — Telephone Encounter (Signed)
Patient called to request re-ordering of CT Chest Lung Cancer Screening test since her previous order expired.   Last ordered by Kandice Robinsons, FNP @ Pulmonology. --Pt states no longer seeing her.   Please Advise.

## 2022-07-15 NOTE — Telephone Encounter (Signed)
Please see message and advise if okay to order.

## 2022-07-19 NOTE — Telephone Encounter (Signed)
Spoke to pt told her orders for CT Chest has been placed and someone will contact you to schedule. Pt verbalized understanding.

## 2022-08-15 ENCOUNTER — Other Ambulatory Visit: Payer: Self-pay | Admitting: Physician Assistant

## 2022-08-23 ENCOUNTER — Encounter (INDEPENDENT_AMBULATORY_CARE_PROVIDER_SITE_OTHER): Payer: Medicare Other | Admitting: Ophthalmology

## 2022-08-23 DIAGNOSIS — H318 Other specified disorders of choroid: Secondary | ICD-10-CM

## 2022-08-23 DIAGNOSIS — H43813 Vitreous degeneration, bilateral: Secondary | ICD-10-CM

## 2022-08-25 ENCOUNTER — Ambulatory Visit
Admission: RE | Admit: 2022-08-25 | Discharge: 2022-08-25 | Disposition: A | Discharge status: Home / Self Care | Payer: Medicare Other | Source: Ambulatory Visit | Attending: Physician Assistant | Admitting: Physician Assistant

## 2022-08-25 ENCOUNTER — Encounter: Payer: Self-pay | Admitting: Hematology

## 2022-08-25 DIAGNOSIS — J439 Emphysema, unspecified: Secondary | ICD-10-CM

## 2022-08-25 DIAGNOSIS — R911 Solitary pulmonary nodule: Secondary | ICD-10-CM

## 2022-08-25 DIAGNOSIS — Z720 Tobacco use: Secondary | ICD-10-CM

## 2022-08-25 DIAGNOSIS — Z87891 Personal history of nicotine dependence: Secondary | ICD-10-CM

## 2022-08-25 DIAGNOSIS — Z122 Encounter for screening for malignant neoplasm of respiratory organs: Secondary | ICD-10-CM

## 2022-08-31 ENCOUNTER — Other Ambulatory Visit: Payer: Self-pay | Admitting: *Deleted

## 2022-08-31 DIAGNOSIS — I7090 Unspecified atherosclerosis: Secondary | ICD-10-CM

## 2022-09-05 ENCOUNTER — Telehealth: Payer: Self-pay | Admitting: Physician Assistant

## 2022-09-05 ENCOUNTER — Ambulatory Visit (INDEPENDENT_AMBULATORY_CARE_PROVIDER_SITE_OTHER): Payer: Medicare Other | Admitting: Physician Assistant

## 2022-09-05 ENCOUNTER — Encounter: Payer: Self-pay | Admitting: Physician Assistant

## 2022-09-05 VITALS — BP 130/80 | HR 71 | Temp 97.3°F | Ht 66.0 in | Wt 180.4 lb

## 2022-09-05 DIAGNOSIS — M25512 Pain in left shoulder: Secondary | ICD-10-CM

## 2022-09-05 DIAGNOSIS — R7309 Other abnormal glucose: Secondary | ICD-10-CM

## 2022-09-05 DIAGNOSIS — M25361 Other instability, right knee: Secondary | ICD-10-CM | POA: Diagnosis not present

## 2022-09-05 DIAGNOSIS — I700 Atherosclerosis of aorta: Secondary | ICD-10-CM

## 2022-09-05 DIAGNOSIS — Z720 Tobacco use: Secondary | ICD-10-CM | POA: Diagnosis not present

## 2022-09-05 DIAGNOSIS — Z13220 Encounter for screening for lipoid disorders: Secondary | ICD-10-CM

## 2022-09-05 DIAGNOSIS — G8929 Other chronic pain: Secondary | ICD-10-CM | POA: Diagnosis not present

## 2022-09-05 DIAGNOSIS — M25511 Pain in right shoulder: Secondary | ICD-10-CM

## 2022-09-05 LAB — LIPID PANEL
Cholesterol: 176 mg/dL (ref 0–200)
HDL: 44.3 mg/dL (ref >39.00)
NonHDL: 131.78
Total CHOL/HDL Ratio: 4
Triglycerides: 230 mg/dL — ABNORMAL HIGH (ref 0.0–149.0)
VLDL: 46 mg/dL — ABNORMAL HIGH (ref 0.0–40.0)

## 2022-09-05 LAB — LDL CHOLESTEROL, DIRECT: Direct LDL: 105 mg/dL

## 2022-09-05 LAB — HEMOGLOBIN A1C: Hgb A1c MFr Bld: 5.5 % (ref 4.6–6.5)

## 2022-09-05 NOTE — Progress Notes (Signed)
Subjective:    Jodi Myers is a 72 y.o. female and is here for a annual review of chronic medical issues.  HPI  Health Maintenance Due  Topic Date Due   Hepatitis C Screening  Never done   Chief Complaint  Patient presents with   Knee Problem    Pt c/o right knee keeps on giving out on her. She would like a referral.   Acute Concerns: Knee gives way:  She complains of her right knee giving out on her.  Was previously seen by Dr. Asencion Partridge on 09/16/2020 for this same issue.  She notes that her knee issues have made it difficult for her to walk for long periods, stating she experiences pain and gets tired easily  Has not done PT, but is receptive to starting.  Her xray from 09/16/2020 revealed: 1. Moderate to severe 3 compartmental osteoarthritis greatest in the lateral and patellofemoral compartments. 2. Small joint effusion. 3. No acute fracture.  Chronic Issues: Nicotine dependence: She reports she smokes daily and is not interested in stopping.   Health Maintenance: Immunizations -- declined any additional immunizations today Colonoscopy -- Never done. Not interested.  Mammogram -- 02/03/2022. Results were normal.  PAP -- Aged out.  Bone Density -- declined Diet -- 2 cup of coffee with sweetened creamer, a cup of sweet tea, and 0.5-1 can of beer. She does not drink water, does not get thirsty and does not like the taste of water.  Exercise -- She does not exercise. Stays active by doing errands and house work.  Sleep habits -- No concerns. 2 Mood -- Stable  UTD with dentist? - Has dentures, no concerns. UTD with eye doctor? - Yes  Weight history: Wt Readings from Last 10 Encounters:  09/05/22 180 lb 6.1 oz (81.8 kg)  05/11/22 183 lb 1.6 oz (83.1 kg)  05/03/22 178 lb (80.7 kg)  02/11/22 178 lb 9.6 oz (81 kg)  09/28/21 183 lb 2 oz (83.1 kg)  06/08/21 184 lb 12.8 oz (83.8 kg)  09/16/20 176 lb 9.6 oz (80.1 kg)  07/15/20 175 lb 1.6 oz (79.4 kg)  01/15/20  174 lb 8 oz (79.2 kg)  07/16/19 167 lb 6.4 oz (75.9 kg)   Body mass index is 29.11 kg/m. No LMP recorded. Patient is postmenopausal.  Alcohol use:  reports current alcohol use of about 7.0 standard drinks of alcohol per week.  Tobacco use:  Tobacco Use: High Risk (09/05/2022)   Patient History    Smoking Tobacco Use: Every Day    Smokeless Tobacco Use: Never    Passive Exposure: Not on file   Eligible for lung cancer screening? no     05/03/2022    8:35 AM  Depression screen PHQ 2/9  Decreased Interest 0  Down, Depressed, Hopeless 0  PHQ - 2 Score 0     Other providers/specialists: Patient Care Team: Jarold Motto, [place] as PCP - General (Physician Assistant) Sherrie George, MD as Consulting Physician (Ophthalmology) Johney Maine, MD as Consulting Physician (Hematology)    PMHx, SurgHx, SocialHx, Medications, and Allergies were reviewed in the Visit Navigator and updated as appropriate.   Past Medical History:  Diagnosis Date   Anemia 07/2018   bloood transfusion administered    Coronary artery disease      Past Surgical History:  Procedure Laterality Date   TONSILLECTOMY       Family History  Problem Relation Age of Onset   Diabetes Mellitus II Mother  Rheum arthritis Mother    CVA Mother    Diabetes Mellitus I Sister    Hashimoto's thyroiditis Sister    Heart attack Father    Colon cancer Neg Hx    Liver disease Neg Hx     Social History   Tobacco Use   Smoking status: Every Day    Packs/day: 1.50    Years: 49.00    Additional pack years: 0.00    Total pack years: 73.50    Types: Cigarettes   Smokeless tobacco: Never  Vaping Use   Vaping Use: Never used  Substance Use Topics   Alcohol use: Yes    Alcohol/week: 7.0 standard drinks of alcohol    Types: 7 Cans of beer per week    Comment: a beer a day   Drug use: Never    Review of Systems:   Review of Systems  Constitutional:  Negative for chills, fever, malaise/fatigue  and weight loss.  HENT:  Negative for hearing loss, sinus pain and sore throat.   Respiratory:  Negative for cough and hemoptysis.   Cardiovascular:  Negative for chest pain, palpitations, leg swelling and PND.  Gastrointestinal:  Negative for abdominal pain, constipation, diarrhea, heartburn, nausea and vomiting.  Genitourinary:  Negative for dysuria, frequency and urgency.  Musculoskeletal:  Positive for joint pain (right knee). Negative for back pain, myalgias and neck pain.  Skin:  Negative for itching and rash.  Neurological:  Negative for dizziness, tingling, seizures and headaches.  Endo/Heme/Allergies:  Negative for polydipsia.  Psychiatric/Behavioral:  Negative for depression. The patient is not nervous/anxious.     Objective:   BP 130/80 (BP Location: Left Arm, Patient Position: Sitting, Cuff Size: Normal)   Pulse 71   Temp (!) 97.3 F (36.3 C) (Temporal)   Ht 5\' 6"  (1.676 m)   Wt 180 lb 6.1 oz (81.8 kg)   SpO2 100%   BMI 29.11 kg/m  Body mass index is 29.11 kg/m.   General Appearance:    Alert, cooperative, no distress, appears stated age  Head:    Normocephalic, without obvious abnormality, atraumatic  Eyes:    PERRL, conjunctiva/corneas clear, EOM's intact, fundi    benign, both eyes  Ears:    Normal TM's and external ear canals, both ears  Nose:   Nares normal, septum midline, mucosa normal, no drainage    or sinus tenderness  Throat:   Lips, mucosa, and tongue normal; teeth and gums normal  Neck:   Supple, symmetrical, trachea midline, no adenopathy;    thyroid:  no enlargement/tenderness/nodules; no carotid   bruit or JVD  Back:     Symmetric, no curvature, ROM normal, no CVA tenderness  Lungs:     Clear to auscultation bilaterally, respirations unlabored  Chest Wall:    No tenderness or deformity   Heart:    Regular rate and rhythm, S1 and S2 normal, no murmur, rub or gallop  Breast Exam:    Deferred  Abdomen:     Soft, non-tender, bowel sounds active all  four quadrants,    no masses, no organomegaly  Genitalia:    Deferred  Extremities:   Extremities normal, atraumatic, no cyanosis or edema  Pulses:   2+ and symmetric all extremities  Skin:   Skin color, texture, turgor normal, no rashes or lesions  Lymph nodes:   Cervical, supraclavicular, and axillary nodes normal  Neurologic:   CNII-XII intact, normal strength, sensation and reflexes    throughout    Assessment/Plan:  Knee gives way, right; Chronic pain of both shoulders Referral to PT  Continuous tobacco abuse Declines Continue yearly CT scan  Elevated glucose Update A1c Discouraged excessive sugar intake as able  Aortic atherosclerosis (HCC) Update lipid panel -- very likely to start statin Continue ASA Follow-up based on results  I,Rachel Rivera,acting as a scribe for [employer], PA.,have documented all relevant documentation on the behalf of Jarold Motto, PA,as directed by  Jarold Motto, PA while in the presence of Jarold Motto, [place].  I, Jarold Motto, [place], have reviewed all documentation for this visit. The documentation on 09/05/22 for the exam, diagnosis, procedures, and orders are all accurate and complete.   Jarold Motto, PA-C [facility]

## 2022-09-05 NOTE — Telephone Encounter (Signed)
Please see message and advise 

## 2022-09-05 NOTE — Telephone Encounter (Signed)
Patient received call from cardiology to schedule but she declined for now. Patient states she wanted to speak with pcp to see if this is still recommended since repeat labs were taken this morning. Requests to speak to either pcp or Jodi Myers.

## 2022-09-05 NOTE — Patient Instructions (Signed)
It was great to see you!  We are going to place referral for physical therapy for you!  Take care,  Jarold Motto PA-C

## 2022-09-06 ENCOUNTER — Other Ambulatory Visit: Payer: Self-pay | Admitting: Physician Assistant

## 2022-09-06 MED ORDER — ATORVASTATIN CALCIUM 40 MG PO TABS: 40.0000 mg | ORAL_TABLET | Freq: Every day | ORAL | 1 refills | Status: DC

## 2022-09-06 NOTE — Telephone Encounter (Signed)
Noted  

## 2022-09-09 ENCOUNTER — Telehealth: Payer: Self-pay | Admitting: Physician Assistant

## 2022-09-09 NOTE — Telephone Encounter (Signed)
Spoke to pt told her I did not call her, maybe it was Physical Therapy. Pt verbalized understanding and said maybe PT called to confirm her appt. Told her okay, I have a good day.

## 2022-09-09 NOTE — Telephone Encounter (Signed)
Patient states she missed a call. Requests to be called.

## 2022-09-13 ENCOUNTER — Encounter: Payer: Self-pay | Admitting: Physical Therapy

## 2022-09-13 ENCOUNTER — Ambulatory Visit (INDEPENDENT_AMBULATORY_CARE_PROVIDER_SITE_OTHER): Payer: Medicare Other | Admitting: Physical Therapy

## 2022-09-13 DIAGNOSIS — M25511 Pain in right shoulder: Secondary | ICD-10-CM | POA: Diagnosis not present

## 2022-09-13 DIAGNOSIS — G8929 Other chronic pain: Secondary | ICD-10-CM

## 2022-09-13 DIAGNOSIS — M6281 Muscle weakness (generalized): Secondary | ICD-10-CM

## 2022-09-13 DIAGNOSIS — M25512 Pain in left shoulder: Secondary | ICD-10-CM | POA: Diagnosis not present

## 2022-09-13 DIAGNOSIS — R262 Difficulty in walking, not elsewhere classified: Secondary | ICD-10-CM

## 2022-09-13 NOTE — Therapy (Signed)
OUTPATIENT PHYSICAL THERAPY LOWER EXTREMITY EVALUATION   Patient Name: Jodi Myers MRN: [id number] DOB:1950/08/06, 72 y.o., female Today's Date: 09/13/2022  END OF SESSION:  PT End of Session - 09/13/22 1020     Visit Number 1    Number of Visits 16    Date for PT Re-Evaluation 11/08/22    PT Start Time 1021    PT Stop Time 1057    PT Time Calculation (min) 36 min             Past Medical History:  Diagnosis Date   Anemia 07/2018   bloood transfusion administered    Coronary artery disease    Past Surgical History:  Procedure Laterality Date   TONSILLECTOMY     Patient Active Problem List   Diagnosis Date Noted   Osteoporosis, declines rx 10/25/2018   Vitamin D deficiency 10/25/2018   Tobacco abuse, started age 56, smokes 1.5 PPD 10/25/2018   Iron deficiency anemia 07/31/2018   Heme + stool 07/19/2018   Pernicious anemia    Pancytopenia (HCC) 01/23/2018   DVT, lower extremity (HCC) 01/23/2018   B12 deficiency 01/23/2018   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 States it is difficult to hold her self up with walking because of the pain in her shoulders and her neck. States that she knows she needs to work on her posture but she just doesn't move that way.  States she see a Land and gets adjustments her and sees a massage therapist 1x/month. States this provides her with temporary relief. Pain is  always there. States that she feels like it is a strain for her to improve her posture.   States her knee did give way years ago and caused her to fall and since then she is very careful with that knee and takes stairs one at a time. States if she is on it too much (40 -60 minutes) she has to sit and then after resting she can get back up and move around.  PERTINENT HISTORY: CAD, blind in right eye- has depth perception difficulties PAIN:  Are you having pain? Yes: NPRS scale: 3 in shoulders/10 Pain location: across tops of shoulders into neck Pain description: achy Aggravating factors: walking Relieving factors: chiropractor and massage therapy  PRECAUTIONS: None  WEIGHT BEARING RESTRICTIONS: No  FALLS:  Has patient fallen in last 6 months? No  LIVING ENVIRONMENT: Has following equipment at home: None  OCCUPATION: not currently working, likes to run  PLOF: Independent  PATIENT GOALS: to be able to walk and hold herself up better    OBJECTIVE:     PATIENT SURVEYS:  FOTO for knee 50%   COGNITION: Overall cognitive status: Within functional limits for tasks assessed     SENSATION: Not tested  EDEMA/observation:  No swelling observed but B varicose veins noted with left >R  POSTURE: rounded shoulders, forward head, decreased lumbar lordosis, increased thoracic kyphosis, and flexed trunk   PALPATION: Tenderness to palpation along right UT, B  rhomboids and pecs   Cervical  A/ROM:    EVAL     Flexion  50% limited (reduced pain)     Extension  50 %limited (pull in neck )     R ROT  50*     L ROT  45*     R SB  18    L SB 5 (tight)     * Pain   (Blank rows = not tested)    UE Measurements Upper Extremity Right EVAL Left EVAL   A/PROM MMT A/PROM MMT  Shoulder Flexion 160 4 145 3+  Shoulder Extension      Shoulder Abduction WFL 4 WFL 4  Shoulder Adduction      Shoulder Internal Rotation Reaches to T12 SP 4- Reaches to T12 SP 4-  Shoulder External  Rotation Reaches to C7 SP 4- Reaches to base of head (pulling) 4-  Elbow Flexion      Elbow Extension      Wrist Flexion      Wrist Extension      Wrist Supination      Wrist Pronation      Wrist Ulnar Deviation      Wrist Radial Deviation      Grip Strength NA  NA     (Blank rows = not tested)   * pain  Pt is right handed    SPECIAL/FUNCTIONAL TESTS:  5x sit to stand arms crossed 20.48 SLS L 17 seconds, R 7 seconds  Tandem on floor 30 seconds B minimal sway Tandem on blue foam 30 seconds with moderate sway either leg     GAIT: Distance walked: 50 FT Assistive device utilized: None Level of assistance: Modified independence Comments: limited hip extension and hip mobility., slumped posture looking down, Slower gait   TODAY'S TREATMENT:                                                                                                                              DATE:   09/13/2022  Therapeutic Exercise:  Aerobic: Supine: Prone:  Seated:  Standing: with back up against wall for tall posture x5 20" holds Neuromuscular Re-education: Manual Therapy: Therapeutic Activity: Self Care: Trigger Point Dry Needling:  Modalities:    PATIENT EDUCATION:  Education details: on current presentation, on HEP, on clinical outcomes score and POC, on importance of exercise/posture interventions Person educated: Patient Education method: Explanation, Demonstration, and Handouts Education comprehension: verbalized understanding   HOME EXERCISE PROGRAM: No medbridge on this date  ASSESSMENT:  CLINICAL IMPRESSION: Patient presents with chronic shoulder pain and instability of her right knee. Patient presents with baseline balance deficits and strength deficit in upper and lower extremities as well as base line postural deficits that are likely contributing to current presentation. Patient would greatly benefit from skilled PT to improve overall strength and mobility and reduce risk of  falling.   OBJECTIVE IMPAIRMENTS: decreased activity tolerance, decreased balance, decreased knowledge of use  of DME, decreased mobility, difficulty walking, decreased ROM, decreased strength, impaired UE functional use, postural dysfunction, and pain.   ACTIVITY LIMITATIONS: standing, stairs, reach over head, and locomotion level  PARTICIPATION LIMITATIONS: meal prep, cleaning, and community activity  PERSONAL FACTORS: Age, Fitness, and Time since onset of injury/illness/exacerbation are also affecting patient's functional outcome.   REHAB POTENTIAL: Good  CLINICAL DECISION MAKING: Stable/uncomplicated  EVALUATION COMPLEXITY: Low   GOALS: Goals reviewed with patient? yes  SHORT TERM GOALS: Target date: 10/11/2022  Patient will be independent in self management strategies to improve quality of life and functional outcomes. Baseline: New Program Goal status: INITIAL  2.  Patient will report at least 25% improvement in overall symptoms and/or function to demonstrate improved functional mobility Baseline: 0% better Goal status: INITIAL  3.  Patient will be able to demonstrate at least 55 degrees of neck ROT in both directions Baseline: see above Goal status: INITIAL       LONG TERM GOALS: Target date: 11/08/2022   Patient will report at least 50% improvement in overall symptoms and/or function to demonstrate improved functional mobility Baseline: 0% better Goal status: INITIAL  2.  Patient will improve score on FOTO outcomes measure to projected score to demonstrate overall improved function and QOL Baseline: see above Goal status: INITIAL  3.  Patient will be able to stand on either leg for at least 30 seconds without UE support to demonstrate improved static balance. Baseline: see above Goal status: INITIAL   PLAN:  PT FREQUENCY: 2x/week  PT DURATION: 8 weeks  PLANNED INTERVENTIONS: Therapeutic exercises, Therapeutic activity, Neuromuscular re-education, Balance  training, Gait training, Patient/Family education, Self Care, Joint mobilization, Joint manipulation, Stair training, Vestibular training, Canalith repositioning, Orthotic/Fit training, Prosthetic training, DME instructions, Aquatic Therapy, Dry Needling, Electrical stimulation, Spinal manipulation, Spinal mobilization, Cryotherapy, Moist heat, Taping, Traction, Ultrasound, Ionotophoresis 4mg /ml Dexamethasone, Manual therapy, and Re-evaluation.   PLAN FOR NEXT SESSION: balance, posture, LE strengthening - unstable surfaces - stairs   12:35 PM, 09/13/22 Tereasa Coop, DPT Physical Therapy with [facility]

## 2022-09-20 ENCOUNTER — Ambulatory Visit (INDEPENDENT_AMBULATORY_CARE_PROVIDER_SITE_OTHER): Payer: Medicare Other | Admitting: Physical Therapy

## 2022-09-20 ENCOUNTER — Encounter: Payer: Self-pay | Admitting: Physical Therapy

## 2022-09-20 DIAGNOSIS — M6281 Muscle weakness (generalized): Secondary | ICD-10-CM | POA: Diagnosis not present

## 2022-09-20 DIAGNOSIS — M25511 Pain in right shoulder: Secondary | ICD-10-CM

## 2022-09-20 DIAGNOSIS — G8929 Other chronic pain: Secondary | ICD-10-CM | POA: Diagnosis not present

## 2022-09-20 DIAGNOSIS — R262 Difficulty in walking, not elsewhere classified: Secondary | ICD-10-CM | POA: Diagnosis not present

## 2022-09-20 DIAGNOSIS — M25512 Pain in left shoulder: Secondary | ICD-10-CM | POA: Diagnosis not present

## 2022-09-20 NOTE — Therapy (Signed)
OUTPATIENT PHYSICAL THERAPY LOWER EXTREMITY Treatment   Patient Name: Jodi Myers MRN: [id number] DOB:February 24, 1951, 72 y.o., female Today's Date: 09/20/2022  END OF SESSION:  PT End of Session - 09/20/22 1429     Visit Number 2    Number of Visits 16    Date for PT Re-Evaluation 11/08/22    PT Start Time 1432    PT Stop Time 1510    PT Time Calculation (min) 38 min              Past Medical History:  Diagnosis Date   Anemia 07/2018   bloood transfusion administered    Coronary artery disease    Past Surgical History:  Procedure Laterality Date   TONSILLECTOMY     Patient Active Problem List   Diagnosis Date Noted   Osteoporosis, declines rx 10/25/2018   Vitamin D deficiency 10/25/2018   Tobacco abuse, started age 23, smokes 1.5 PPD 10/25/2018   Iron deficiency anemia 07/31/2018   Heme + stool 07/19/2018   Pernicious anemia    Pancytopenia (HCC) 01/23/2018   DVT, lower extremity (HCC) 01/23/2018   B12 deficiency 01/23/2018   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 No pain right currently.  EVAL:FShoulder pain has been going on for years and some days it can be exhausting because of the pain. States it is difficult to hold her self up with walking because of the pain in her shoulders and her neck. States that she knows she needs to work on her posture but she just doesn't move that way.  States she see a Land and gets adjustments her  and sees a massage therapist 1x/month. States this provides her with temporary relief. Pain is always there. States that she feels like it is a strain for her to improve her posture.   States her knee did give way years ago and caused her to fall and since then she is very careful with that knee and takes stairs one at a time. States if she is on it too much (40 -60 minutes) she has to sit and then after resting she can get back up and move around.  PERTINENT HISTORY: CAD, blind in right eye- has depth perception difficulties PAIN:  Are you having pain? Yes: NPRS scale: 3 in shoulders/10 Pain location: across tops of shoulders into neck Pain description: achy Aggravating factors: walking Relieving factors: chiropractor and massage therapy  PRECAUTIONS: None  WEIGHT BEARING RESTRICTIONS: No  FALLS:  Has patient fallen in last 6 months? No  LIVING ENVIRONMENT: Has following equipment at home: None  OCCUPATION: not currently working, likes to run  PLOF: Independent  PATIENT GOALS: to be able to walk and hold herself up better    OBJECTIVE:     PATIENT SURVEYS:  FOTO for knee 50%   COGNITION: Overall cognitive status: Within functional limits for tasks assessed     SENSATION: Not tested  EDEMA/observation:  No swelling observed but B varicose veins noted with left >R  POSTURE: rounded shoulders, forward head, decreased lumbar lordosis, increased thoracic  kyphosis, and flexed trunk   PALPATION: Tenderness to palpation along right UT, B rhomboids and pecs   Cervical  A/ROM:    EVAL     Flexion  50% limited (reduced pain)     Extension  50 %limited (pull in neck )     R ROT  50*     L ROT  45*     R SB  18    L SB 5 (tight)     * Pain   (Blank rows = not tested)    UE Measurements Upper Extremity Right EVAL Left EVAL   A/PROM MMT A/PROM MMT  Shoulder Flexion 160 4 145 3+  Shoulder Extension      Shoulder Abduction WFL 4 WFL 4  Shoulder Adduction       Shoulder Internal Rotation Reaches to T12 SP 4- Reaches to T12 SP 4-  Shoulder External Rotation Reaches to C7 SP 4- Reaches to base of head (pulling) 4-  Elbow Flexion      Elbow Extension      Wrist Flexion      Wrist Extension      Wrist Supination      Wrist Pronation      Wrist Ulnar Deviation      Wrist Radial Deviation      Grip Strength NA  NA     (Blank rows = not tested)   * pain  Pt is right handed    SPECIAL/FUNCTIONAL TESTS:  5x sit to stand arms crossed 20.48 SLS L 17 seconds, R 7 seconds (6/18 30 seconds on the right leg) Tandem on floor 30 seconds B minimal sway Tandem on blue foam 30 seconds with moderate sway either leg     GAIT: Distance walked: 50 FT Assistive device utilized: None Level of assistance: Modified independence Comments: limited hip extension and hip mobility., slumped posture looking down, Slower gait   TODAY'S TREATMENT:                                                                                                                              DATE:   09/20/2022  Therapeutic Exercise:  Aerobic: Supine: Prone:  Seated: LAQs 3x3 10" holds B, STS slow and controlled 2x12   Standing: with back up against wall for tall posture x5 20" holds, shoulder flexion up wall x20 gently press out into pillowcase, STEPS no UE support 4" step up and down - B 8 minutes Neuromuscular Re-education:SLS MAX ATTEMPTS with one finger support and no support 10 minutes, staggered stance balance on 6" step 30" holds x3 B Manual Therapy: Therapeutic Activity: Self Care: Trigger Point Dry Needling:  Modalities:    PATIENT EDUCATION:  Education details: on HEP Person educated: Patient Education method: Explanation, Facilities manager, and Handouts Education comprehension: verbalized understanding   HOME EXERCISE PROGRAM: [id number]  ASSESSMENT:  CLINICAL IMPRESSION: 09/20/2022 Progressed and reviewed exercises. Improved SLS noted on this date on right LE.  Printed off all new exercises for HEP adherence. No pain during session. Overall fatigue in legs noted end of session. Will continue with current POC as tolerated.   Eval: Patient presents with chronic shoulder pain and instability of her right knee. Patient presents with baseline balance deficits and strength deficit in upper and lower extremities as well as base line postural deficits that are likely contributing to current presentation. Patient would greatly benefit from skilled PT to improve overall strength and mobility and reduce risk of falling.   OBJECTIVE IMPAIRMENTS: decreased activity tolerance, decreased balance, decreased knowledge of use of DME, decreased mobility, difficulty walking, decreased ROM, decreased strength, impaired UE functional use, postural dysfunction, and pain.   ACTIVITY LIMITATIONS: standing, stairs, reach over head, and locomotion level  PARTICIPATION LIMITATIONS: meal prep, cleaning, and community activity  PERSONAL FACTORS: Age, Fitness, and Time since onset of injury/illness/exacerbation are also affecting patient's functional outcome.   REHAB POTENTIAL: Good  CLINICAL DECISION MAKING: Stable/uncomplicated  EVALUATION COMPLEXITY: Low   GOALS: Goals reviewed with patient? yes  SHORT TERM GOALS: Target date: 10/11/2022  Patient will be independent in self management strategies to improve quality of life and functional outcomes. Baseline: New Program Goal status: INITIAL  2.  Patient will report at least 25% improvement in overall symptoms and/or function to demonstrate improved functional mobility Baseline: 0% better Goal status: INITIAL  3.  Patient will be able to demonstrate at least 55 degrees of neck ROT in both directions Baseline: see above Goal status: INITIAL       LONG TERM GOALS: Target date: 11/08/2022   Patient will report at least 50% improvement in overall symptoms and/or function to demonstrate improved functional  mobility Baseline: 0% better Goal status: INITIAL  2.  Patient will improve score on FOTO outcomes measure to projected score to demonstrate overall improved function and QOL Baseline: see above Goal status: INITIAL  3.  Patient will be able to stand on either leg for at least 30 seconds without UE support to demonstrate improved static balance. Baseline: see above Goal status: INITIAL   PLAN:  PT FREQUENCY: 2x/week  PT DURATION: 8 weeks  PLANNED INTERVENTIONS: Therapeutic exercises, Therapeutic activity, Neuromuscular re-education, Balance training, Gait training, Patient/Family education, Self Care, Joint mobilization, Joint manipulation, Stair training, Vestibular training, Canalith repositioning, Orthotic/Fit training, Prosthetic training, DME instructions, Aquatic Therapy, Dry Needling, Electrical stimulation, Spinal manipulation, Spinal mobilization, Cryotherapy, Moist heat, Taping, Traction, Ultrasound, Ionotophoresis 4mg /ml Dexamethasone, Manual therapy, and Re-evaluation.   PLAN FOR NEXT SESSION: balance, posture, LE strengthening - unstable surfaces - stairs   3:14 PM, 09/20/22 Tereasa Coop, DPT Physical Therapy with [facility]

## 2022-09-22 ENCOUNTER — Encounter: Payer: Self-pay | Admitting: Physical Therapy

## 2022-09-22 ENCOUNTER — Ambulatory Visit (INDEPENDENT_AMBULATORY_CARE_PROVIDER_SITE_OTHER): Payer: Medicare Other | Admitting: Physical Therapy

## 2022-09-22 DIAGNOSIS — M6281 Muscle weakness (generalized): Secondary | ICD-10-CM

## 2022-09-22 DIAGNOSIS — R262 Difficulty in walking, not elsewhere classified: Secondary | ICD-10-CM | POA: Diagnosis not present

## 2022-09-22 DIAGNOSIS — M25512 Pain in left shoulder: Secondary | ICD-10-CM | POA: Diagnosis not present

## 2022-09-22 DIAGNOSIS — G8929 Other chronic pain: Secondary | ICD-10-CM | POA: Diagnosis not present

## 2022-09-22 DIAGNOSIS — M25511 Pain in right shoulder: Secondary | ICD-10-CM

## 2022-09-22 NOTE — Therapy (Signed)
OUTPATIENT PHYSICAL THERAPY LOWER EXTREMITY Treatment   Patient Name: Jodi Myers MRN: [id number] DOB:04-Aug-1950, 72 y.o., female Today's Date: 09/22/2022  END OF SESSION:  PT End of Session - 09/22/22 1346     Visit Number 3    Number of Visits 16    Date for PT Re-Evaluation 11/08/22    PT Start Time 1346    PT Stop Time 1425    PT Time Calculation (min) 39 min              Past Medical History:  Diagnosis Date   Anemia 07/2018   bloood transfusion administered    Coronary artery disease    Past Surgical History:  Procedure Laterality Date   TONSILLECTOMY     Patient Active Problem List   Diagnosis Date Noted   Osteoporosis, declines rx 10/25/2018   Vitamin D deficiency 10/25/2018   Tobacco abuse, started age 9, smokes 1.5 PPD 10/25/2018   Iron deficiency anemia 07/31/2018   Heme + stool 07/19/2018   Pernicious anemia    Pancytopenia (HCC) 01/23/2018   DVT, lower extremity (HCC) 01/23/2018   B12 deficiency 01/23/2018   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 States it's uncomfortable and feels insecure.   EVAL:FShoulder pain has been going on for years and some days it can be exhausting because of the pain. States it is difficult to hold her self up with walking because of the pain in her shoulders and her neck. States that she knows she needs to work on her posture but she just doesn't move that way.  States she see  a [facility] and gets adjustments her and sees a massage therapist 1x/month. States this provides her with temporary relief. Pain is always there. States that she feels like it is a strain for her to improve her posture.   States her knee did give way years ago and caused her to fall and since then she is very careful with that knee and takes stairs one at a time. States if she is on it too much (40 -60 minutes) she has to sit and then after resting she can get back up and move around.  PERTINENT HISTORY: CAD, blind in right eye- has depth perception difficulties PAIN:  Are you having pain?yes 1-2 in right knee   PRECAUTIONS: None  WEIGHT BEARING RESTRICTIONS: No  FALLS:  Has patient fallen in last 6 months? No  LIVING ENVIRONMENT: Has following equipment at home: None  OCCUPATION: not currently working, likes to run  PLOF: Independent  PATIENT GOALS: to be able to walk and hold herself up better    OBJECTIVE:     PATIENT SURVEYS:  FOTO for knee 50%   COGNITION: Overall cognitive status: Within functional limits for tasks assessed     SENSATION: Not tested  EDEMA/observation:  No swelling observed but B varicose veins noted with left >R  POSTURE: rounded shoulders, forward head, decreased lumbar lordosis, increased thoracic kyphosis, and flexed trunk   PALPATION: Tenderness to palpation  along right UT, B rhomboids and pecs   Cervical  A/ROM:    EVAL     Flexion  50% limited (reduced pain)     Extension  50 %limited (pull in neck )     R ROT  50*     L ROT  45*     R SB  18    L SB 5 (tight)     * Pain   (Blank rows = not tested)    UE Measurements Upper Extremity Right EVAL Left EVAL   A/PROM MMT A/PROM MMT  Shoulder Flexion 160 4 145 3+  Shoulder Extension      Shoulder Abduction WFL 4 WFL 4  Shoulder Adduction      Shoulder Internal Rotation Reaches to T12 SP 4- Reaches to T12 SP 4-  Shoulder External Rotation Reaches to C7 SP 4- Reaches to  base of head (pulling) 4-  Elbow Flexion      Elbow Extension      Wrist Flexion      Wrist Extension      Wrist Supination      Wrist Pronation      Wrist Ulnar Deviation      Wrist Radial Deviation      Grip Strength NA  NA     (Blank rows = not tested)   * pain  Pt is right handed    SPECIAL/FUNCTIONAL TESTS:  5x sit to stand arms crossed 20.48 SLS L 17 seconds, R 7 seconds (6/18 30 seconds on the right leg) Tandem on floor 30 seconds B minimal sway Tandem on blue foam 30 seconds with moderate sway either leg     GAIT: Distance walked: 50 FT Assistive device utilized: None Level of assistance: Modified independence Comments: limited hip extension and hip mobility., slumped posture looking down, Slower gait   TODAY'S TREATMENT:                                                                                                                              DATE:   09/22/2022  Therapeutic Exercise:  Aerobic: 5 minutes  Supine: Prone:  Seated: LAQs 2x10 5" holds B, STS slow and controlled 2x12, STS - different foot/knee position - then added green band  at elevated chair 5x5, clamshells green band x10 10" hlds   Standing: STEPS no UE support 4" step up and down - B 4x5,TKE ball 2 minutes 5" holds Neuromuscular Re-education:  Manual Therapy: Therapeutic Activity: Self Care: Trigger Point Dry Needling:  Modalities:    PATIENT EDUCATION:  Education details: on HEP Person educated: Patient Education method: Programmer, multimedia, Facilities manager, and Handouts Education comprehension: verbalized understanding   HOME EXERCISE PROGRAM: [id number]  ASSESSMENT:  CLINICAL IMPRESSION: 09/22/2022 Focused on right knee secondary to complaints of knee catching on this date. Tolerated all exercises well but hip abd activation with band best as movements improved and reduced catching noted after wards. Added this to HEP  and provided patient with theraband for HEP compliance. Will continue  with current POC as tolerated.   Eval: Patient presents with chronic shoulder pain and instability of her right knee. Patient presents with baseline balance deficits and strength deficit in upper and lower extremities as well as base line postural deficits that are likely contributing to current presentation. Patient would greatly benefit from skilled PT to improve overall strength and mobility and reduce risk of falling.   OBJECTIVE IMPAIRMENTS: decreased activity tolerance, decreased balance, decreased knowledge of use of DME, decreased mobility, difficulty walking, decreased ROM, decreased strength, impaired UE functional use, postural dysfunction, and pain.   ACTIVITY LIMITATIONS: standing, stairs, reach over head, and locomotion level  PARTICIPATION LIMITATIONS: meal prep, cleaning, and community activity  PERSONAL FACTORS: Age, Fitness, and Time since onset of injury/illness/exacerbation are also affecting patient's functional outcome.   REHAB POTENTIAL: Good  CLINICAL DECISION MAKING: Stable/uncomplicated  EVALUATION COMPLEXITY: Low   GOALS: Goals reviewed with patient? yes  SHORT TERM GOALS: Target date: 10/11/2022  Patient will be independent in self management strategies to improve quality of life and functional outcomes. Baseline: New Program Goal status: INITIAL  2.  Patient will report at least 25% improvement in overall symptoms and/or function to demonstrate improved functional mobility Baseline: 0% better Goal status: INITIAL  3.  Patient will be able to demonstrate at least 55 degrees of neck ROT in both directions Baseline: see above Goal status: INITIAL       LONG TERM GOALS: Target date: 11/08/2022   Patient will report at least 50% improvement in overall symptoms and/or function to demonstrate improved functional mobility Baseline: 0% better Goal status: INITIAL  2.  Patient will improve score on FOTO outcomes measure to projected score to demonstrate  overall improved function and QOL Baseline: see above Goal status: INITIAL  3.  Patient will be able to stand on either leg for at least 30 seconds without UE support to demonstrate improved static balance. Baseline: see above Goal status: INITIAL   PLAN:  PT FREQUENCY: 2x/week  PT DURATION: 8 weeks  PLANNED INTERVENTIONS: Therapeutic exercises, Therapeutic activity, Neuromuscular re-education, Balance training, Gait training, Patient/Family education, Self Care, Joint mobilization, Joint manipulation, Stair training, Vestibular training, Canalith repositioning, Orthotic/Fit training, Prosthetic training, DME instructions, Aquatic Therapy, Dry Needling, Electrical stimulation, Spinal manipulation, Spinal mobilization, Cryotherapy, Moist heat, Taping, Traction, Ultrasound, Ionotophoresis 4mg /ml Dexamethasone, Manual therapy, and Re-evaluation.   PLAN FOR NEXT SESSION: balance, posture, LE strengthening - unstable surfaces - stairs   2:29 PM, 09/22/22 Tereasa Coop, DPT Physical Therapy with [facility]

## 2022-09-27 ENCOUNTER — Ambulatory Visit (INDEPENDENT_AMBULATORY_CARE_PROVIDER_SITE_OTHER): Payer: Medicare Other | Admitting: Physical Therapy

## 2022-09-27 ENCOUNTER — Encounter: Payer: Self-pay | Admitting: Physical Therapy

## 2022-09-27 DIAGNOSIS — M6281 Muscle weakness (generalized): Secondary | ICD-10-CM | POA: Diagnosis not present

## 2022-09-27 DIAGNOSIS — R262 Difficulty in walking, not elsewhere classified: Secondary | ICD-10-CM

## 2022-09-27 DIAGNOSIS — M25511 Pain in right shoulder: Secondary | ICD-10-CM | POA: Diagnosis not present

## 2022-09-27 DIAGNOSIS — G8929 Other chronic pain: Secondary | ICD-10-CM

## 2022-09-27 DIAGNOSIS — M25512 Pain in left shoulder: Secondary | ICD-10-CM

## 2022-09-27 NOTE — Therapy (Signed)
OUTPATIENT PHYSICAL THERAPY LOWER EXTREMITY Treatment   Patient Name: Jodi Myers MRN: [id number] DOB:1950-07-02, 72 y.o., female Today's Date: 09/27/2022  END OF SESSION:  PT End of Session - 09/27/22 0930     Visit Number 4    Number of Visits 16    Date for PT Re-Evaluation 11/08/22    PT Start Time 0931    PT Stop Time 1009    PT Time Calculation (min) 38 min    Activity Tolerance Patient tolerated treatment well              Past Medical History:  Diagnosis Date   Anemia 07/2018   bloood transfusion administered    Coronary artery disease    Past Surgical History:  Procedure Laterality Date   TONSILLECTOMY     Patient Active Problem List   Diagnosis Date Noted   Osteoporosis, declines rx 10/25/2018   Vitamin D deficiency 10/25/2018   Tobacco abuse, started age 38, smokes 1.5 PPD 10/25/2018   Iron deficiency anemia 07/31/2018   Heme + stool 07/19/2018   Pernicious anemia    Pancytopenia (HCC) 01/23/2018   DVT, lower extremity (HCC) 01/23/2018   B12 deficiency 01/23/2018   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 States been busy and had unexpected company.  EVAL:Shoulder pain has been going on for years and some days it can be exhausting because of the pain. States it is difficult to hold her self up with walking because of the pain in her shoulders and her neck. States that she knows she needs to work on her  posture but she just doesn't move that way.  States she see a Land and gets adjustments her and sees a massage therapist 1x/month. States this provides her with temporary relief. Pain is always there. States that she feels like it is a strain for her to improve her posture.   States her knee did give way years ago and caused her to fall and since then she is very careful with that knee and takes stairs one at a time. States if she is on it too much (40 -60 minutes) she has to sit and then after resting she can get back up and move around.  PERTINENT HISTORY: CAD, blind in right eye- has depth perception difficulties PAIN:  Are you having pain?yes 1-2 in right knee   PRECAUTIONS: None  WEIGHT BEARING RESTRICTIONS: No  FALLS:  Has patient fallen in last 6 months? No  LIVING ENVIRONMENT: Has following equipment at home: None  OCCUPATION: not currently working, likes to run  PLOF: Independent  PATIENT GOALS: to be able to walk and hold herself up better    OBJECTIVE:     PATIENT SURVEYS:  FOTO for knee 50%   COGNITION: Overall cognitive status: Within functional limits for tasks assessed     SENSATION: Not tested  EDEMA/observation:  No swelling observed but B varicose veins noted with left >R  POSTURE: rounded shoulders, forward head, decreased lumbar lordosis, increased thoracic kyphosis, and  flexed trunk   PALPATION: Tenderness to palpation along right UT, B rhomboids and pecs   Cervical  A/ROM:    EVAL     Flexion  50% limited (reduced pain)     Extension  50 %limited (pull in neck )     R ROT  50*     L ROT  45*     R SB  18    L SB 5 (tight)     * Pain   (Blank rows = not tested)    UE Measurements Upper Extremity Right EVAL Left EVAL   A/PROM MMT A/PROM MMT  Shoulder Flexion 160 4 145 3+  Shoulder Extension      Shoulder Abduction WFL 4 WFL 4  Shoulder Adduction      Shoulder Internal Rotation Reaches to T12 SP 4- Reaches to T12 SP 4-   Shoulder External Rotation Reaches to C7 SP 4- Reaches to base of head (pulling) 4-  Elbow Flexion      Elbow Extension      Wrist Flexion      Wrist Extension      Wrist Supination      Wrist Pronation      Wrist Ulnar Deviation      Wrist Radial Deviation      Grip Strength NA  NA     (Blank rows = not tested)   * pain  Pt is right handed    SPECIAL/FUNCTIONAL TESTS:  5x sit to stand arms crossed 20.48 SLS L 17 seconds, R 7 seconds (6/18 30 seconds on the right leg) Tandem on floor 30 seconds B minimal sway Tandem on blue foam 30 seconds with moderate sway either leg     GAIT: Distance walked: 50 FT Assistive device utilized: None Level of assistance: Modified independence Comments: limited hip extension and hip mobility., slumped posture looking down, Slower gait   TODAY'S TREATMENT:                                                                                                                              DATE:   09/27/2022  Therapeutic Exercise:  Aerobic:  Supine: Prone:  Seated: shoulder ER with red band 2x10 B, LAQS X3 30" BOUTS B, STS 10# 2X12, STEP UPS 6# 3X5 B   Standing: wall slides up wall shoulder flexion x15 5" hlds, shoulder ER at wall 5" holds 2x10 B, horizontal shoulder abd 2x10 B 5" holds, shoulder scaption with red band 3x10   Neuromuscular Re-education:  Manual Therapy: Therapeutic Activity: Self Care: Trigger Point Dry Needling:  Modalities:    PATIENT EDUCATION:  Education details: on HEP Person educated: Patient Education method: Programmer, multimedia, Facilities manager, and Handouts Education comprehension: verbalized understanding   HOME EXERCISE PROGRAM: [id number]  ASSESSMENT:  CLINICAL IMPRESSION: 09/27/2022 Focused on shoulders and neck today secondary to only a few exercises for this area of the body on HEP. Tolerated all exercises well. No increase in  pain noted during or after session. Verbal and tactile cues throughout session for  form. Will continue with current POC as tolerated.   Eval: Patient presents with chronic shoulder pain and instability of her right knee. Patient presents with baseline balance deficits and strength deficit in upper and lower extremities as well as base line postural deficits that are likely contributing to current presentation. Patient would greatly benefit from skilled PT to improve overall strength and mobility and reduce risk of falling.   OBJECTIVE IMPAIRMENTS: decreased activity tolerance, decreased balance, decreased knowledge of use of DME, decreased mobility, difficulty walking, decreased ROM, decreased strength, impaired UE functional use, postural dysfunction, and pain.   ACTIVITY LIMITATIONS: standing, stairs, reach over head, and locomotion level  PARTICIPATION LIMITATIONS: meal prep, cleaning, and community activity  PERSONAL FACTORS: Age, Fitness, and Time since onset of injury/illness/exacerbation are also affecting patient's functional outcome.   REHAB POTENTIAL: Good  CLINICAL DECISION MAKING: Stable/uncomplicated  EVALUATION COMPLEXITY: Low   GOALS: Goals reviewed with patient? yes  SHORT TERM GOALS: Target date: 10/11/2022  Patient will be independent in self management strategies to improve quality of life and functional outcomes. Baseline: New Program Goal status: INITIAL  2.  Patient will report at least 25% improvement in overall symptoms and/or function to demonstrate improved functional mobility Baseline: 0% better Goal status: INITIAL  3.  Patient will be able to demonstrate at least 55 degrees of neck ROT in both directions Baseline: see above Goal status: INITIAL       LONG TERM GOALS: Target date: 11/08/2022   Patient will report at least 50% improvement in overall symptoms and/or function to demonstrate improved functional mobility Baseline: 0% better Goal status: INITIAL  2.  Patient will improve score on FOTO outcomes measure to projected  score to demonstrate overall improved function and QOL Baseline: see above Goal status: INITIAL  3.  Patient will be able to stand on either leg for at least 30 seconds without UE support to demonstrate improved static balance. Baseline: see above Goal status: INITIAL   PLAN:  PT FREQUENCY: 2x/week  PT DURATION: 8 weeks  PLANNED INTERVENTIONS: Therapeutic exercises, Therapeutic activity, Neuromuscular re-education, Balance training, Gait training, Patient/Family education, Self Care, Joint mobilization, Joint manipulation, Stair training, Vestibular training, Canalith repositioning, Orthotic/Fit training, Prosthetic training, DME instructions, Aquatic Therapy, Dry Needling, Electrical stimulation, Spinal manipulation, Spinal mobilization, Cryotherapy, Moist heat, Taping, Traction, Ultrasound, Ionotophoresis 4mg /ml Dexamethasone, Manual therapy, and Re-evaluation.   PLAN FOR NEXT SESSION: balance, posture, LE strengthening - unstable surfaces - stairs   10:17 AM, 09/27/22 Tereasa Coop, DPT Physical Therapy with [facility]

## 2022-09-29 ENCOUNTER — Ambulatory Visit (INDEPENDENT_AMBULATORY_CARE_PROVIDER_SITE_OTHER): Payer: Medicare Other | Admitting: Physical Therapy

## 2022-09-29 ENCOUNTER — Encounter: Payer: Self-pay | Admitting: Physical Therapy

## 2022-09-29 DIAGNOSIS — G8929 Other chronic pain: Secondary | ICD-10-CM | POA: Diagnosis not present

## 2022-09-29 DIAGNOSIS — M25511 Pain in right shoulder: Secondary | ICD-10-CM | POA: Diagnosis not present

## 2022-09-29 DIAGNOSIS — M6281 Muscle weakness (generalized): Secondary | ICD-10-CM

## 2022-09-29 DIAGNOSIS — M25512 Pain in left shoulder: Secondary | ICD-10-CM | POA: Diagnosis not present

## 2022-09-29 DIAGNOSIS — R262 Difficulty in walking, not elsewhere classified: Secondary | ICD-10-CM

## 2022-09-29 NOTE — Therapy (Signed)
OUTPATIENT PHYSICAL THERAPY LOWER EXTREMITY Treatment   Patient Name: Jodi Myers MRN: [id number] DOB:[birth year], 72 y.o., female Today's Date: 09/29/2022  END OF SESSION:  PT End of Session - 09/29/22 1346     Visit Number 5    Number of Visits 16    Date for PT Re-Evaluation 11/08/22    PT Start Time 1346    PT Stop Time 1425    PT Time Calculation (min) 39 min    Activity Tolerance Patient tolerated treatment well              Past Medical History:  Diagnosis Date   Anemia 07/2018   bloood transfusion administered    Coronary artery disease    Past Surgical History:  Procedure Laterality Date   TONSILLECTOMY     Patient Active Problem List   Diagnosis Date Noted   Osteoporosis, declines rx 10/25/2018   Vitamin D deficiency 10/25/2018   Tobacco abuse, started age 89, smokes 1.5 PPD 10/25/2018   Iron deficiency anemia 07/31/2018   Heme + stool 07/19/2018   Pernicious anemia    Pancytopenia (HCC) 01/23/2018   DVT, lower extremity (HCC) 01/23/2018   B12 deficiency 01/23/2018   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  EVAL:Shoulder pain has been going on for years and some days it can be exhausting because of the pain. States it is difficult to hold her self up with walking because of the pain in her shoulders and her neck. States that she knows she needs to work on her posture but she just doesn't move that way.  States she see  a [facility] and gets adjustments her and sees a massage therapist 1x/month. States this provides her with temporary relief. Pain is always there. States that she feels like it is a strain for her to improve her posture.   States her knee did give way years ago and caused her to fall and since then she is very careful with that knee and takes stairs one at a time. States if she is on it too much (40 -60 minutes) she has to sit and then after resting she can get back up and move around.  PERTINENT HISTORY: CAD, blind in right eye- has depth perception difficulties PAIN:  Are you having pain?yes 1-2 in right knee   PRECAUTIONS: None  WEIGHT BEARING RESTRICTIONS: No  FALLS:  Has patient fallen in last 6 months? No  LIVING ENVIRONMENT: Has following equipment at home: None  OCCUPATION: not currently working, likes to run  PLOF: Independent  PATIENT GOALS: to be able to walk and hold herself up better    OBJECTIVE:     PATIENT SURVEYS:  FOTO for knee 50%   COGNITION: Overall cognitive status: Within functional limits for tasks assessed     SENSATION: Not tested  EDEMA/observation:  No swelling observed but B varicose veins noted with left >R  POSTURE: rounded shoulders, forward head, decreased lumbar lordosis, increased thoracic kyphosis, and flexed trunk   PALPATION: Tenderness to palpation along right UT,  B rhomboids and pecs   Cervical  A/ROM:    EVAL     Flexion  50% limited (reduced pain)     Extension  50 %limited (pull in neck )     R ROT  50*     L ROT  45*     R SB  18    L SB 5 (tight)     * Pain   (Blank rows = not tested)    UE Measurements Upper Extremity Right EVAL Left EVAL   A/PROM MMT A/PROM MMT  Shoulder Flexion 160 4 145 3+  Shoulder Extension      Shoulder Abduction WFL 4 WFL 4  Shoulder Adduction      Shoulder Internal Rotation Reaches to T12 SP 4- Reaches to T12 SP 4-  Shoulder External Rotation Reaches to C7 SP 4- Reaches to  base of head (pulling) 4-  Elbow Flexion      Elbow Extension      Wrist Flexion      Wrist Extension      Wrist Supination      Wrist Pronation      Wrist Ulnar Deviation      Wrist Radial Deviation      Grip Strength NA  NA     (Blank rows = not tested)   * pain  Pt is right handed    SPECIAL/FUNCTIONAL TESTS:  5x sit to stand arms crossed 20.48 SLS L 17 seconds, R 7 seconds (6/18 30 seconds on the right leg) Tandem on floor 30 seconds B minimal sway Tandem on blue foam 30 seconds with moderate sway either leg     GAIT: Distance walked: 50 FT Assistive device utilized: None Level of assistance: Modified independence Comments: limited hip extension and hip mobility., slumped posture looking down, Slower gait   TODAY'S TREATMENT:                                                                                                                              DATE:   09/29/2022  Therapeutic Exercise:  Aerobic:  Supine: Prone:  Seated: shoulder diagonals 2x10 B YTB, horizontal shoulder abd YTB 2x10 B   Standing: marching on blue foam with one hand support 2x10, B shoulder flexion up wall x15 5" holds   Neuromuscular Re-education: forward taps and reaches standing on foam 2x10, side stepping up on and over  blue mat no UE support x15 B Manual Therapy:IASTM with percussion gun to B UT - tolerated well  Therapeutic Activity: Self Care: Trigger Point Dry Needling:  Modalities:    PATIENT EDUCATION:  Education details: on HEP Person educated: Patient Education method: Programmer, multimedia, Facilities manager, and Handouts Education comprehension: verbalized understanding   HOME EXERCISE PROGRAM: [id number]  ASSESSMENT:  CLINICAL IMPRESSION: 09/29/2022 Continued to progress balance and shoulder exercises on this date. Forward reaches on blue foam were challenging but no loss of balance. No new exercises to HEP secondary  to extensive HEP at this time.Tolerated percussion  gun well reporting less cramping in UT afterwards, educated patient in safe use of percussion gun with towel as buffer if interested in getting one. Will continue with current POC as tolerated.   Eval: Patient presents with chronic shoulder pain and instability of her right knee. Patient presents with baseline balance deficits and strength deficit in upper and lower extremities as well as base line postural deficits that are likely contributing to current presentation. Patient would greatly benefit from skilled PT to improve overall strength and mobility and reduce risk of falling.   OBJECTIVE IMPAIRMENTS: decreased activity tolerance, decreased balance, decreased knowledge of use of DME, decreased mobility, difficulty walking, decreased ROM, decreased strength, impaired UE functional use, postural dysfunction, and pain.   ACTIVITY LIMITATIONS: standing, stairs, reach over head, and locomotion level  PARTICIPATION LIMITATIONS: meal prep, cleaning, and community activity  PERSONAL FACTORS: Age, Fitness, and Time since onset of injury/illness/exacerbation are also affecting patient's functional outcome.   REHAB POTENTIAL: Good  CLINICAL DECISION MAKING: Stable/uncomplicated  EVALUATION COMPLEXITY: Low   GOALS: Goals reviewed with patient? yes  SHORT TERM GOALS: Target date: 10/11/2022  Patient will be independent in self management strategies to improve quality of life and functional outcomes. Baseline: New Program Goal status: INITIAL  2.  Patient will report at least 25% improvement in overall symptoms and/or function to demonstrate improved functional mobility Baseline: 0% better Goal status: INITIAL  3.  Patient will be able to demonstrate at least 55 degrees of neck ROT in both directions Baseline: see above Goal status: INITIAL       LONG TERM GOALS: Target date: 11/08/2022   Patient will report at least 50% improvement in overall symptoms and/or function to demonstrate  improved functional mobility Baseline: 0% better Goal status: INITIAL  2.  Patient will improve score on FOTO outcomes measure to projected score to demonstrate overall improved function and QOL Baseline: see above Goal status: INITIAL  3.  Patient will be able to stand on either leg for at least 30 seconds without UE support to demonstrate improved static balance. Baseline: see above Goal status: INITIAL   PLAN:  PT FREQUENCY: 2x/week  PT DURATION: 8 weeks  PLANNED INTERVENTIONS: Therapeutic exercises, Therapeutic activity, Neuromuscular re-education, Balance training, Gait training, Patient/Family education, Self Care, Joint mobilization, Joint manipulation, Stair training, Vestibular training, Canalith repositioning, Orthotic/Fit training, Prosthetic training, DME instructions, Aquatic Therapy, Dry Needling, Electrical stimulation, Spinal manipulation, Spinal mobilization, Cryotherapy, Moist heat, Taping, Traction, Ultrasound, Ionotophoresis 4mg /ml Dexamethasone, Manual therapy, and Re-evaluation.   PLAN FOR NEXT SESSION: balance, posture, LE strengthening - unstable surfaces - stairs   2:28 PM, 09/29/22 Tereasa Coop, DPT Physical Therapy with [facility]

## 2022-10-04 ENCOUNTER — Encounter: Payer: Self-pay | Admitting: Physical Therapy

## 2022-10-04 ENCOUNTER — Ambulatory Visit (INDEPENDENT_AMBULATORY_CARE_PROVIDER_SITE_OTHER): Payer: Medicare Other | Admitting: Physical Therapy

## 2022-10-04 DIAGNOSIS — M25512 Pain in left shoulder: Secondary | ICD-10-CM

## 2022-10-04 DIAGNOSIS — M25511 Pain in right shoulder: Secondary | ICD-10-CM | POA: Diagnosis not present

## 2022-10-04 DIAGNOSIS — G8929 Other chronic pain: Secondary | ICD-10-CM | POA: Diagnosis not present

## 2022-10-04 DIAGNOSIS — R262 Difficulty in walking, not elsewhere classified: Secondary | ICD-10-CM | POA: Diagnosis not present

## 2022-10-04 DIAGNOSIS — M6281 Muscle weakness (generalized): Secondary | ICD-10-CM | POA: Diagnosis not present

## 2022-10-04 NOTE — Therapy (Signed)
OUTPATIENT PHYSICAL THERAPY LOWER EXTREMITY Treatment   Patient Name: Jodi Myers MRN: [id number] DOB:February 06, 1951, 72 y.o., female Today's Date: 10/04/2022  END OF SESSION:  PT End of Session - 10/04/22 0932     Visit Number 6    Number of Visits 16    Date for PT Re-Evaluation 11/08/22    PT Start Time 0933    PT Stop Time 1012    PT Time Calculation (min) 39 min    Activity Tolerance Patient tolerated treatment well              Past Medical History:  Diagnosis Date   Anemia 07/2018   bloood transfusion administered    Coronary artery disease    Past Surgical History:  Procedure Laterality Date   TONSILLECTOMY     Patient Active Problem List   Diagnosis Date Noted   Osteoporosis, declines rx 10/25/2018   Vitamin D deficiency 10/25/2018   Tobacco abuse, started age 70, smokes 1.5 PPD 10/25/2018   Iron deficiency anemia 07/31/2018   Heme + stool 07/19/2018   Pernicious anemia    Pancytopenia (HCC) 01/23/2018   DVT, lower extremity (HCC) 01/23/2018   B12 deficiency 01/23/2018   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 States it is tight again. States it just got tight. If she holds it a certain way it is ok. States it started after the yard sale.   EVAL:Shoulder pain has been going on for years and some days it can be exhausting because of the pain. States it is difficult to hold her self up with walking because of the pain in her  shoulders and her neck. States that she knows she needs to work on her posture but she just doesn't move that way.  States she see a Land and gets adjustments her and sees a massage therapist 1x/month. States this provides her with temporary relief. Pain is always there. States that she feels like it is a strain for her to improve her posture.   States her knee did give way years ago and caused her to fall and since then she is very careful with that knee and takes stairs one at a time. States if she is on it too much (40 -60 minutes) she has to sit and then after resting she can get back up and move around.  PERTINENT HISTORY: CAD, blind in right eye- has depth perception difficulties PAIN:  Are you having pain?yes 5/10 tightness in left neck.   PRECAUTIONS: None  WEIGHT BEARING RESTRICTIONS: No  FALLS:  Has patient fallen in last 6 months? No  LIVING ENVIRONMENT: Has following equipment at home: None  OCCUPATION: not currently working, likes to run  PLOF: Independent  PATIENT GOALS: to be able to walk and hold herself up better    OBJECTIVE:     PATIENT SURVEYS:  FOTO for knee 50%   COGNITION: Overall cognitive status: Within functional limits for tasks assessed     SENSATION: Not tested  EDEMA/observation:  No swelling observed but  B varicose veins noted with left >R  POSTURE: rounded shoulders, forward head, decreased lumbar lordosis, increased thoracic kyphosis, and flexed trunk   PALPATION: Tenderness to palpation along right UT, B rhomboids and pecs   Cervical  A/ROM:    EVAL     Flexion  50% limited (reduced pain)     Extension  50 %limited (pull in neck )     R ROT  50*     L ROT  45*     R SB  18    L SB 5 (tight)     * Pain   (Blank rows = not tested)    UE Measurements Upper Extremity Right EVAL Left EVAL   A/PROM MMT A/PROM MMT  Shoulder Flexion 160 4 145 3+  Shoulder Extension      Shoulder Abduction WFL 4 WFL 4  Shoulder  Adduction      Shoulder Internal Rotation Reaches to T12 SP 4- Reaches to T12 SP 4-  Shoulder External Rotation Reaches to C7 SP 4- Reaches to base of head (pulling) 4-  Elbow Flexion      Elbow Extension      Wrist Flexion      Wrist Extension      Wrist Supination      Wrist Pronation      Wrist Ulnar Deviation      Wrist Radial Deviation      Grip Strength NA  NA     (Blank rows = not tested)   * pain  Pt is right handed    SPECIAL/FUNCTIONAL TESTS:  5x sit to stand arms crossed 20.48 SLS L 17 seconds, R 7 seconds (6/18 30 seconds on the right leg) Tandem on floor 30 seconds B minimal sway Tandem on blue foam 30 seconds with moderate sway either leg     GAIT: Distance walked: 50 FT Assistive device utilized: None Level of assistance: Modified independence Comments: limited hip extension and hip mobility., slumped posture looking down, Slower gait   TODAY'S TREATMENT:                                                                                                                              DATE:   10/04/2022  Therapeutic Exercise:  Aerobic:  Supine: Prone:  Seated: neck rolls x20, cervical SB B x20, scap retraction x25 5" holdsshoulder flexion with towel 3x8 B, standing: shoulder extension with towel 3x8 B   Standing: field goal at wall with scap protraction and retraction with chin tuck 5 minutes   Neuromuscular Re-education:  Manual Therapy:IASTM with percussion gun to B UT - tolerated well 12 minutes  Therapeutic Activity: Self Care: Trigger Point Dry Needling:  Modalities:    PATIENT EDUCATION:  Education details: on HEP, how to safely use percussion gun Person educated: Patient Education method: Explanation, Demonstration, and Handouts Education comprehension: verbalized understanding   HOME EXERCISE PROGRAM: [id number]  ASSESSMENT:  CLINICAL IMPRESSION: 10/04/2022 Focused  on neck and shoulder secondary to increase in pain and stiffness sin this  area today. Educated patient in benefits and safe use of self mobilization with percussion gun. Answered all questions and tolerated exercises well. Cued for form and to breath throughout session. Overall reduced tightness noted end of session, will continue with current POC as tolerated.  Eval: Patient presents with chronic shoulder pain and instability of her right knee. Patient presents with baseline balance deficits and strength deficit in upper and lower extremities as well as base line postural deficits that are likely contributing to current presentation. Patient would greatly benefit from skilled PT to improve overall strength and mobility and reduce risk of falling.   OBJECTIVE IMPAIRMENTS: decreased activity tolerance, decreased balance, decreased knowledge of use of DME, decreased mobility, difficulty walking, decreased ROM, decreased strength, impaired UE functional use, postural dysfunction, and pain.   ACTIVITY LIMITATIONS: standing, stairs, reach over head, and locomotion level  PARTICIPATION LIMITATIONS: meal prep, cleaning, and community activity  PERSONAL FACTORS: Age, Fitness, and Time since onset of injury/illness/exacerbation are also affecting patient's functional outcome.   REHAB POTENTIAL: Good  CLINICAL DECISION MAKING: Stable/uncomplicated  EVALUATION COMPLEXITY: Low   GOALS: Goals reviewed with patient? yes  SHORT TERM GOALS: Target date: 10/11/2022  Patient will be independent in self management strategies to improve quality of life and functional outcomes. Baseline: New Program Goal status: INITIAL  2.  Patient will report at least 25% improvement in overall symptoms and/or function to demonstrate improved functional mobility Baseline: 0% better Goal status: INITIAL  3.  Patient will be able to demonstrate at least 55 degrees of neck ROT in both directions Baseline: see above Goal status: INITIAL       LONG TERM GOALS: Target date: 11/08/2022    Patient will report at least 50% improvement in overall symptoms and/or function to demonstrate improved functional mobility Baseline: 0% better Goal status: INITIAL  2.  Patient will improve score on FOTO outcomes measure to projected score to demonstrate overall improved function and QOL Baseline: see above Goal status: INITIAL  3.  Patient will be able to stand on either leg for at least 30 seconds without UE support to demonstrate improved static balance. Baseline: see above Goal status: INITIAL   PLAN:  PT FREQUENCY: 2x/week  PT DURATION: 8 weeks  PLANNED INTERVENTIONS: Therapeutic exercises, Therapeutic activity, Neuromuscular re-education, Balance training, Gait training, Patient/Family education, Self Care, Joint mobilization, Joint manipulation, Stair training, Vestibular training, Canalith repositioning, Orthotic/Fit training, Prosthetic training, DME instructions, Aquatic Therapy, Dry Needling, Electrical stimulation, Spinal manipulation, Spinal mobilization, Cryotherapy, Moist heat, Taping, Traction, Ultrasound, Ionotophoresis 4mg /ml Dexamethasone, Manual therapy, and Re-evaluation.   PLAN FOR NEXT SESSION: balance, posture, LE strengthening - unstable surfaces - stairs   10:13 AM, 10/04/22 Tereasa Coop, DPT Physical Therapy with [facility]

## 2022-10-11 ENCOUNTER — Encounter: Payer: Self-pay | Admitting: Physical Therapy

## 2022-10-11 ENCOUNTER — Ambulatory Visit (INDEPENDENT_AMBULATORY_CARE_PROVIDER_SITE_OTHER): Payer: Medicare Other | Admitting: Physical Therapy

## 2022-10-11 DIAGNOSIS — M25511 Pain in right shoulder: Secondary | ICD-10-CM

## 2022-10-11 DIAGNOSIS — R262 Difficulty in walking, not elsewhere classified: Secondary | ICD-10-CM | POA: Diagnosis not present

## 2022-10-11 DIAGNOSIS — M25512 Pain in left shoulder: Secondary | ICD-10-CM | POA: Diagnosis not present

## 2022-10-11 DIAGNOSIS — M6281 Muscle weakness (generalized): Secondary | ICD-10-CM | POA: Diagnosis not present

## 2022-10-11 DIAGNOSIS — G8929 Other chronic pain: Secondary | ICD-10-CM | POA: Diagnosis not present

## 2022-10-11 NOTE — Therapy (Signed)
OUTPATIENT PHYSICAL THERAPY LOWER EXTREMITY Treatment   Patient Name: Jodi Myers MRN: [id number] DOB:07-21-1950, 72 y.o., female Today's Date: 10/11/2022  END OF SESSION:  PT End of Session - 10/11/22 0935     Visit Number 7    Number of Visits 16    Date for PT Re-Evaluation 11/08/22    PT Start Time 0936    PT Stop Time 1014    PT Time Calculation (min) 38 min    Activity Tolerance Patient tolerated treatment well              Past Medical History:  Diagnosis Date   Anemia 07/2018   bloood transfusion administered    Coronary artery disease    Past Surgical History:  Procedure Laterality Date   TONSILLECTOMY     Patient Active Problem List   Diagnosis Date Noted   Osteoporosis, declines rx 10/25/2018   Vitamin D deficiency 10/25/2018   Tobacco abuse, started age 43, smokes 1.5 PPD 10/25/2018   Iron deficiency anemia 07/31/2018   Heme + stool 07/19/2018   Pernicious anemia    Pancytopenia (HCC) 01/23/2018   DVT, lower extremity (HCC) 01/23/2018   B12 deficiency 01/23/2018   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 States she used head but she can't turn her head to the right. States that so much so she had to get her husband to drive her. Pain is on the left side.  EVAL:Shoulder pain has been going on for years and some days it can be exhausting because of the pain. States it is difficult to  hold her self up with walking because of the pain in her shoulders and her neck. States that she knows she needs to work on her posture but she just doesn't move that way.  States she see a Land and gets adjustments her and sees a massage therapist 1x/month. States this provides her with temporary relief. Pain is always there. States that she feels like it is a strain for her to improve her posture.   States her knee did give way years ago and caused her to fall and since then she is very careful with that knee and takes stairs one at a time. States if she is on it too much (40 -60 minutes) she has to sit and then after resting she can get back up and move around.  PERTINENT HISTORY: CAD, blind in right eye- has depth perception difficulties PAIN:  Are you having pain?yes 5/10 tightness in left neck.   PRECAUTIONS: None  WEIGHT BEARING RESTRICTIONS: No  FALLS:  Has patient fallen in last 6 months? No  LIVING ENVIRONMENT: Has following equipment at home: None  OCCUPATION: not currently working, likes to run  PLOF: Independent  PATIENT GOALS: to be able to walk and hold herself up better    OBJECTIVE:     PATIENT SURVEYS:  FOTO for knee 50%   COGNITION: Overall cognitive status: Within functional limits for tasks assessed  SENSATION: Not tested  EDEMA/observation:  No swelling observed but B varicose veins noted with left >R  POSTURE: rounded shoulders, forward head, decreased lumbar lordosis, increased thoracic kyphosis, and flexed trunk   PALPATION: Tenderness to palpation along right UT, B rhomboids and pecs   Cervical  A/ROM:    EVAL     Flexion  50% limited (reduced pain)     Extension  50 %limited (pull in neck )     R ROT  50*     L ROT  45*     R SB  18    L SB 5 (tight)     * Pain   (Blank rows = not tested)    UE Measurements Upper Extremity Right EVAL Left EVAL   A/PROM MMT A/PROM MMT  Shoulder Flexion 160 4 145 3+  Shoulder  Extension      Shoulder Abduction WFL 4 WFL 4  Shoulder Adduction      Shoulder Internal Rotation Reaches to T12 SP 4- Reaches to T12 SP 4-  Shoulder External Rotation Reaches to C7 SP 4- Reaches to base of head (pulling) 4-  Elbow Flexion      Elbow Extension      Wrist Flexion      Wrist Extension      Wrist Supination      Wrist Pronation      Wrist Ulnar Deviation      Wrist Radial Deviation      Grip Strength NA  NA     (Blank rows = not tested)   * pain  Pt is right handed    SPECIAL/FUNCTIONAL TESTS:  5x sit to stand arms crossed 20.48 SLS L 17 seconds, R 7 seconds (6/18 30 seconds on the right leg) Tandem on floor 30 seconds B minimal sway Tandem on blue foam 30 seconds with moderate sway either leg     GAIT: Distance walked: 50 FT Assistive device utilized: None Level of assistance: Modified independence Comments: limited hip extension and hip mobility., slumped posture looking down, Slower gait   TODAY'S TREATMENT:                                                                                                                              DATE:   10/11/2022  Therapeutic Exercise:  Aerobic:  Supine: chin tuck with PT guidance 6 minutes, cervical ROT on ball 6 minutes, PROM of neck in SB/ROT to tolerance 6 minutes  Prone:  Seated:    Standing:    Neuromuscular Re-education:  Manual Therapy:STM to B SCM, sub occipitals, UT and cervical paraspinals, Cervical traction - tolerated well  Therapeutic Activity: Self Care: Trigger Point Dry Needling:  Modalities: thermotherapy to neck and left shoulder during supine exercises   PATIENT EDUCATION:  Education details: on HEP, on rationale behind interventions and focus moving forward Person educated: Patient Education method: Explanation, Demonstration, and Handouts Education comprehension: verbalized understanding   HOME  EXERCISE PROGRAM: [id number]  ASSESSMENT:  CLINICAL IMPRESSION: 10/11/2022 Focused on  neck therapy today secondary to recent increase in muscle spasm and pain on left side of neck.  Tolerated heat and manual interventions well.  Discussed performing neck range of motion exercises and gravity elevated position as well as with Pilates ball behind neck to reduce SCM activation.  Improved cervical rotation noted end of session but still hesitancy with movement.  Patient with tendency to keep shoulders elevated and neck and upper cervical extension.  Educated patient in this habitual posture as well as how to correct.  Will follow-up with neck pain next session.  Will continue with current plan of care. Eval: Patient presents with chronic shoulder pain and instability of her right knee. Patient presents with baseline balance deficits and strength deficit in upper and lower extremities as well as base line postural deficits that are likely contributing to current presentation. Patient would greatly benefit from skilled PT to improve overall strength and mobility and reduce risk of falling.   OBJECTIVE IMPAIRMENTS: decreased activity tolerance, decreased balance, decreased knowledge of use of DME, decreased mobility, difficulty walking, decreased ROM, decreased strength, impaired UE functional use, postural dysfunction, and pain.   ACTIVITY LIMITATIONS: standing, stairs, reach over head, and locomotion level  PARTICIPATION LIMITATIONS: meal prep, cleaning, and community activity  PERSONAL FACTORS: Age, Fitness, and Time since onset of injury/illness/exacerbation are also affecting patient's functional outcome.   REHAB POTENTIAL: Good  CLINICAL DECISION MAKING: Stable/uncomplicated  EVALUATION COMPLEXITY: Low   GOALS: Goals reviewed with patient? yes  SHORT TERM GOALS: Target date: 10/11/2022  Patient will be independent in self management strategies to improve quality of life and functional outcomes. Baseline: New Program Goal status: INITIAL  2.  Patient will report at least 25%  improvement in overall symptoms and/or function to demonstrate improved functional mobility Baseline: 0% better Goal status: INITIAL  3.  Patient will be able to demonstrate at least 55 degrees of neck ROT in both directions Baseline: see above Goal status: INITIAL       LONG TERM GOALS: Target date: 11/08/2022   Patient will report at least 50% improvement in overall symptoms and/or function to demonstrate improved functional mobility Baseline: 0% better Goal status: INITIAL  2.  Patient will improve score on FOTO outcomes measure to projected score to demonstrate overall improved function and QOL Baseline: see above Goal status: INITIAL  3.  Patient will be able to stand on either leg for at least 30 seconds without UE support to demonstrate improved static balance. Baseline: see above Goal status: INITIAL   PLAN:  PT FREQUENCY: 2x/week  PT DURATION: 8 weeks  PLANNED INTERVENTIONS: Therapeutic exercises, Therapeutic activity, Neuromuscular re-education, Balance training, Gait training, Patient/Family education, Self Care, Joint mobilization, Joint manipulation, Stair training, Vestibular training, Canalith repositioning, Orthotic/Fit training, Prosthetic training, DME instructions, Aquatic Therapy, Dry Needling, Electrical stimulation, Spinal manipulation, Spinal mobilization, Cryotherapy, Moist heat, Taping, Traction, Ultrasound, Ionotophoresis 4mg /ml Dexamethasone, Manual therapy, and Re-evaluation.   PLAN FOR NEXT SESSION: balance, posture, LE strengthening - unstable surfaces - stairs   10:18 AM, 10/11/22 Tereasa Coop, DPT Physical Therapy with [facility]

## 2022-10-13 ENCOUNTER — Ambulatory Visit
Admission: RE | Admit: 2022-10-13 | Discharge: 2022-10-13 | Disposition: A | Discharge status: Home / Self Care | Payer: Medicare Other | Source: Ambulatory Visit | Attending: Internal Medicine | Admitting: Internal Medicine

## 2022-10-13 ENCOUNTER — Ambulatory Visit (INDEPENDENT_AMBULATORY_CARE_PROVIDER_SITE_OTHER): Payer: Medicare Other | Admitting: Physical Therapy

## 2022-10-13 ENCOUNTER — Encounter: Payer: Self-pay | Admitting: Internal Medicine

## 2022-10-13 ENCOUNTER — Telehealth (INDEPENDENT_AMBULATORY_CARE_PROVIDER_SITE_OTHER): Payer: Medicare Other | Admitting: Internal Medicine

## 2022-10-13 ENCOUNTER — Encounter: Payer: Self-pay | Admitting: Physical Therapy

## 2022-10-13 DIAGNOSIS — M542 Cervicalgia: Secondary | ICD-10-CM

## 2022-10-13 DIAGNOSIS — R262 Difficulty in walking, not elsewhere classified: Secondary | ICD-10-CM | POA: Diagnosis not present

## 2022-10-13 DIAGNOSIS — G8929 Other chronic pain: Secondary | ICD-10-CM

## 2022-10-13 DIAGNOSIS — M25511 Pain in right shoulder: Secondary | ICD-10-CM

## 2022-10-13 DIAGNOSIS — M810 Age-related osteoporosis without current pathological fracture: Secondary | ICD-10-CM | POA: Diagnosis not present

## 2022-10-13 DIAGNOSIS — M546 Pain in thoracic spine: Secondary | ICD-10-CM | POA: Diagnosis not present

## 2022-10-13 DIAGNOSIS — M6281 Muscle weakness (generalized): Secondary | ICD-10-CM | POA: Diagnosis not present

## 2022-10-13 DIAGNOSIS — M47812 Spondylosis without myelopathy or radiculopathy, cervical region: Secondary | ICD-10-CM | POA: Diagnosis not present

## 2022-10-13 DIAGNOSIS — E559 Vitamin D deficiency, unspecified: Secondary | ICD-10-CM

## 2022-10-13 DIAGNOSIS — M25512 Pain in left shoulder: Secondary | ICD-10-CM | POA: Diagnosis not present

## 2022-10-13 DIAGNOSIS — M47816 Spondylosis without myelopathy or radiculopathy, lumbar region: Secondary | ICD-10-CM | POA: Diagnosis not present

## 2022-10-13 DIAGNOSIS — M47814 Spondylosis without myelopathy or radiculopathy, thoracic region: Secondary | ICD-10-CM | POA: Diagnosis not present

## 2022-10-13 MED ORDER — GABAPENTIN 100 MG PO CAPS
100.0000 mg | ORAL_CAPSULE | Freq: Three times a day (TID) | ORAL | 3 refills | Status: DC
Start: 2022-10-13 — End: 2022-10-25

## 2022-10-13 MED ORDER — METHYLPREDNISOLONE 4 MG PO TBPK
ORAL_TABLET | ORAL | 0 refills | Status: DC
Start: 2022-10-13 — End: 2022-10-25

## 2022-10-13 MED ORDER — EVENITY 105 MG/1.17ML ~~LOC~~ SOSY
210.0000 mg | PREFILLED_SYRINGE | Freq: Once | SUBCUTANEOUS | 0 refills | Status: AC
Start: 2022-10-13 — End: 2022-10-13

## 2022-10-13 MED ORDER — CELECOXIB 200 MG PO CAPS
200.0000 mg | ORAL_CAPSULE | Freq: Two times a day (BID) | ORAL | 3 refills | Status: DC
Start: 2022-10-13 — End: 2022-10-25

## 2022-10-13 NOTE — Therapy (Signed)
OUTPATIENT PHYSICAL THERAPY LOWER EXTREMITY Treatment   Patient Name: Jodi Myers MRN: [id number] DOB:[birth year], 72 y.o., female Today's Date: 10/13/2022  END OF SESSION:  PT End of Session - 10/13/22 0933     Visit Number 8    Number of Visits 16    Date for PT Re-Evaluation 11/08/22    PT Start Time 0934    PT Stop Time 1012    PT Time Calculation (min) 38 min    Activity Tolerance Patient tolerated treatment well              Past Medical History:  Diagnosis Date   Anemia 07/2018   bloood transfusion administered    Coronary artery disease    Past Surgical History:  Procedure Laterality Date   TONSILLECTOMY     Patient Active Problem List   Diagnosis Date Noted   Osteoporosis, declines rx 10/25/2018   Vitamin D deficiency 10/25/2018   Tobacco abuse, started age 36, smokes 1.5 PPD 10/25/2018   Iron deficiency anemia 07/31/2018   Heme + stool 07/19/2018   Pernicious anemia    Pancytopenia (HCC) 01/23/2018   DVT, lower extremity (HCC) 01/23/2018   B12 deficiency 01/23/2018   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    PCP: Jarold Motto, PA   REFERRING PROVIDER: Jarold Motto, PA   REFERRING DIAG: [phone] (ICD-10-CM) - Knee gives way, right M25.511,G89.29,M25.512 (ICD-10-CM) - Chronic pain of both shoulders  THERAPY DIAG:  Muscle weakness (generalized)  Difficulty in walking, not elsewhere classified  Chronic pain of both shoulders  Rationale for Evaluation and Treatment: Rehabilitation  ONSET DATE: shoulder years  SUBJECTIVE:   SUBJECTIVE STATEMENT: 10/13/2022 States that she was feeling better after last session but continues to have intermittent spasms and she can't do anything because she is so uncomfortable. States she got the ball and that is helping her. States that she is not 100% sure what is setting it off.   EVAL:Shoulder pain has been going on for years and some days it can be exhausting because of the pain. States it is  difficult to hold her self up with walking because of the pain in her shoulders and her neck. States that she knows she needs to work on her posture but she just doesn't move that way.  States she see a Land and gets adjustments her and sees a massage therapist 1x/month. States this provides her with temporary relief. Pain is always there. States that she feels like it is a strain for her to improve her posture.   States her knee did give way years ago and caused her to fall and since then she is very careful with that knee and takes stairs one at a time. States if she is on it too much (40 -60 minutes) she has to sit and then after resting she can get back up and move around.  PERTINENT HISTORY: CAD, blind in right eye- has depth perception difficulties PAIN:  Are you having pain?yes 5/10 tightness in left neck.   PRECAUTIONS: None  WEIGHT BEARING RESTRICTIONS: No  FALLS:  Has patient fallen in last 6 months? No  LIVING ENVIRONMENT: Has following equipment at home: None  OCCUPATION: not currently working, likes to run  PLOF: Independent  PATIENT GOALS: to be able to walk and hold herself up better    OBJECTIVE:     PATIENT SURVEYS:  FOTO for knee 50%   COGNITION: Overall cognitive status: Within functional limits for tasks assessed     SENSATION: Not  tested  EDEMA/observation:  No swelling observed but B varicose veins noted with left >R  POSTURE: rounded shoulders, forward head, decreased lumbar lordosis, increased thoracic kyphosis, and flexed trunk   PALPATION: Tenderness to palpation along right UT, B rhomboids and pecs   Cervical  A/ROM:    EVAL     Flexion  50% limited (reduced pain)     Extension  50 %limited (pull in neck )     R ROT  50*     L ROT  45*     R SB  18    L SB 5 (tight)     * Pain   (Blank rows = not tested)    UE Measurements Upper Extremity Right EVAL Left EVAL   A/PROM MMT A/PROM MMT  Shoulder Flexion 160 4 145 3+   Shoulder Extension      Shoulder Abduction WFL 4 WFL 4  Shoulder Adduction      Shoulder Internal Rotation Reaches to T12 SP 4- Reaches to T12 SP 4-  Shoulder External Rotation Reaches to C7 SP 4- Reaches to base of head (pulling) 4-  Elbow Flexion      Elbow Extension      Wrist Flexion      Wrist Extension      Wrist Supination      Wrist Pronation      Wrist Ulnar Deviation      Wrist Radial Deviation      Grip Strength NA  NA     (Blank rows = not tested)   * pain  Pt is right handed    SPECIAL/FUNCTIONAL TESTS:  5x sit to stand arms crossed 20.48 SLS L 17 seconds, R 7 seconds (6/18 30 seconds on the right leg) Tandem on floor 30 seconds B minimal sway Tandem on blue foam 30 seconds with moderate sway either leg     GAIT: Distance walked: 50 FT Assistive device utilized: None Level of assistance: Modified independence Comments: limited hip extension and hip mobility., slumped posture looking down, Slower gait   TODAY'S TREATMENT:                                                                                                                              DATE:   10/13/2022  Therapeutic Exercise:  Aerobic:  Supine: cervical PROM SB/ ROT/ upper cervical ROT - 10 minutes Prone:  Seated:    Standing:    Neuromuscular Re-education:  Manual Therapy:STM to B SCM, sub occipitals, UT and cervical paraspinals, Cervical traction - tolerated well  Therapeutic Activity: Self Care: Trigger Point Dry Needling:  Modalities: thermotherapy to neck and left shoulder during supine exercises   PATIENT EDUCATION:  Education details: on HEP, on MD f/u, on posture/seat support in car to reduce pain Person educated: Patient Education method: Explanation, Demonstration, and Handouts Education comprehension: verbalized understanding   HOME EXERCISE PROGRAM: [id number]  ASSESSMENT:  CLINICAL IMPRESSION: 10/13/2022 Second session with  increased pain and spasming along night.  Discussed possible triggers/causes and changing support in car while driving/passenger. Discussed MD f/u secondary to continued increase in pain and debilitating nature of pain. Will f/u with patient next session about pain and possible DN if indicated.   Eval: Patient presents with chronic shoulder pain and instability of her right knee. Patient presents with baseline balance deficits and strength deficit in upper and lower extremities as well as base line postural deficits that are likely contributing to current presentation. Patient would greatly benefit from skilled PT to improve overall strength and mobility and reduce risk of falling.   OBJECTIVE IMPAIRMENTS: decreased activity tolerance, decreased balance, decreased knowledge of use of DME, decreased mobility, difficulty walking, decreased ROM, decreased strength, impaired UE functional use, postural dysfunction, and pain.   ACTIVITY LIMITATIONS: standing, stairs, reach over head, and locomotion level  PARTICIPATION LIMITATIONS: meal prep, cleaning, and community activity  PERSONAL FACTORS: Age, Fitness, and Time since onset of injury/illness/exacerbation are also affecting patient's functional outcome.   REHAB POTENTIAL: Good  CLINICAL DECISION MAKING: Stable/uncomplicated  EVALUATION COMPLEXITY: Low   GOALS: Goals reviewed with patient? yes  SHORT TERM GOALS: Target date: 10/11/2022  Patient will be independent in self management strategies to improve quality of life and functional outcomes. Baseline: New Program Goal status: INITIAL  2.  Patient will report at least 25% improvement in overall symptoms and/or function to demonstrate improved functional mobility Baseline: 0% better Goal status: INITIAL  3.  Patient will be able to demonstrate at least 55 degrees of neck ROT in both directions Baseline: see above Goal status: INITIAL       LONG TERM GOALS: Target date: 11/08/2022   Patient will report at least 50%  improvement in overall symptoms and/or function to demonstrate improved functional mobility Baseline: 0% better Goal status: INITIAL  2.  Patient will improve score on FOTO outcomes measure to projected score to demonstrate overall improved function and QOL Baseline: see above Goal status: INITIAL  3.  Patient will be able to stand on either leg for at least 30 seconds without UE support to demonstrate improved static balance. Baseline: see above Goal status: INITIAL   PLAN:  PT FREQUENCY: 2x/week  PT DURATION: 8 weeks  PLANNED INTERVENTIONS: Therapeutic exercises, Therapeutic activity, Neuromuscular re-education, Balance training, Gait training, Patient/Family education, Self Care, Joint mobilization, Joint manipulation, Stair training, Vestibular training, Canalith repositioning, Orthotic/Fit training, Prosthetic training, DME instructions, Aquatic Therapy, Dry Needling, Electrical stimulation, Spinal manipulation, Spinal mobilization, Cryotherapy, Moist heat, Taping, Traction, Ultrasound, Ionotophoresis 4mg /ml Dexamethasone, Manual therapy, and Re-evaluation.   PLAN FOR NEXT SESSION: DN? Neck  balance, posture, LE strengthening - unstable surfaces - stairs   10:25 AM, 10/13/22 Tereasa Coop, DPT Physical Therapy with [facility]

## 2022-10-13 NOTE — Assessment & Plan Note (Signed)
Neck Pain: Acute exacerbation of chronic neck pain, likely due to nerve irritation or possibly compression fracture(s), is suspected, with a history of osteoporosis. We will order an x-ray of the cervical, thoracic, and lumbar spine to evaluate for possible compression fracture or other structural abnormalities. Medrol Dosepak is prescribed for acute pain relief, and Celebrex 200mg  twice daily is recommended as a safer alternative to ibuprofen for ongoing pain management. Gabapentin will be considered for potential nerve-related pain, starting at the smallest dose as she has significant sedative concerns.

## 2022-10-13 NOTE — Progress Notes (Signed)
[facility]: [phone]   Virtual Medical Office Visit - Video Telemedicine   Patient:  Jodi Myers ([birth year])  MRN:   [id number]      Date:   10/13/2022  Patient Care Team: Jarold Motto, [place] as PCP - General (Physician Assistant) Sherrie George, MD as Consulting Physician (Ophthalmology) Johney Maine, MD as Consulting Physician (Hematology) Today's Healthcare Provider: Lula Olszewski, MD   Assessment & Plan   [name] was seen today for neck pain and muscle spasms.  Neck pain Assessment & Plan: Neck Pain: Acute exacerbation of chronic neck pain, likely due to nerve irritation or possibly compression fracture(s), is suspected, with a history of osteoporosis. We will order an x-ray of the cervical, thoracic, and lumbar spine to evaluate for possible compression fracture or other structural abnormalities. Medrol Dosepak is prescribed for acute pain relief, and Celebrex 200mg  twice daily is recommended as a safer alternative to ibuprofen for ongoing pain management. Gabapentin will be considered for potential nerve-related pain, starting at the smallest dose as she has significant sedative concerns.  Orders: -     DG Cervical Spine Complete; Future -     Celecoxib; Take 1 capsule (200 mg total) by mouth 2 (two) times daily.  Dispense: 180 capsule; Refill: 3 -     methylPREDNISolone; Use as directed.  Dispense: 21 each; Refill: 0 -     Gabapentin; Take 1 capsule (100 mg total) by mouth 3 (three) times daily.  Dispense: 90 capsule; Refill: 3 -     DG Thoracic Spine 2 View; Future -     DG Lumbar Spine Complete; Future  Osteoporosis, unspecified osteoporosis type, unspecified pathological fracture presence Overview: 2019 DEXA results N/A but showed osteoporosis, that was not treated due to cost.  Assessment & Plan: Osteoporosis: Severe bone loss was noted on a previous bone density test in 2019, and she has not been on any osteoporosis medication  due to cost concerns. A repeat bone density test will be ordered. We will initiate the process for obtaining Evenity, a bone-building medication, pending insurance approval and increase Vitamin D intake by adding an additional pill to her current regimen of two pills daily.  Orders: -     Evenity; Inject 210 mg into the skin once for 1 dose.  Dispense: 2.4 mL; Refill: 0 -     DG Bone Density; Future  Vitamin D deficiency   Follow-up in 1-2 weeks is planned to evaluate the effectiveness of medications, review x-ray and bone density test results, and finalize the plan for osteoporosis management.   Treatment plan discussed and reviewed in detail. Explained medication safety and potential side effects.  Answered all patient questions and confirmed understanding and comfort with the plan. Encouraged patient to contact our office if they have any questions or concerns.  Agreed on patient coming for a sooner office visit if symptoms worsen, persist, or new symptoms develop. Discussed precautions in case of needing to visit the Emergency Department.    Subjective:   Chief Complaint / Reason for Visit:  Neck pain and muscle spasms (For about a week.)    Discussed the use of AI scribe software for clinical note transcription with the patient, who gave verbal consent to proceed.  History of Present Illness   The patient, with a history of osteoporosis, presented with acute neck and shoulder pain, which has become a significant issue disrupting her daily activities. The pain, described as originating from the shoulder blade and extending up  to the neck, is exacerbated by certain movements and has limited her range of motion, particularly on turning the neck to the right. The patient reported that the pain is not constant but occurs in flares, during which she experiences severe discomfort and difficulty in lifting her head.  She also has pain all up and down her spine for which she would like x-rays  completed when she goes to get neck x-rays for current pain.  The patient has previously managed similar episodes of pain with a Medrol Dosepak, which she found beneficial. She has also been taking calcium with vitamin D supplements twice daily for her osteoporosis. The patient has a history of blood clots and low blood counts, which make NSAIDs a poor choice (although she is using ibuprofen anyway).  She does not like muscle relaxers.  Wants to avoid sedatives.    The patient has previously undergone a bone density test, which revealed severe bone loss in 2019. She has not had any imaging of her neck or spine or a recent bone density test. The patient has declined osteoporosis medication in the past due to cost concerns. She has also declined injections for osteoporosis treatment due to the high cost.  She knows about osteonecrosis risks with bisphosphonates after 5 years, but doesn't seemed to have not taken them due to this risk.  The patient has a history of chronic pain, which she typically manages without medical intervention. However, the current episode of neck and shoulder pain is significantly impacting her lifestyle, prompting her to seek medical attention.        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   albuterol (VENTOLIN HFA) 108 (90 Base) MCG/ACT inhaler Inhale 2 puffs into the lungs every 6 (six) hours as needed for wheezing or shortness of breath.   ASPIRIN LOW DOSE 81 MG EC tablet TAKE 1 TABLET BY MOUTH EVERY DAY   atorvastatin (LIPITOR) 40 MG tablet Take 1 tablet (40 mg total) by mouth daily.   CALCIUM PO Take 1 tablet by mouth daily.   cyanocobalamin (,VITAMIN B-12,) 1000 MCG/ML injection Inject 1 mL (1,000 mcg total) into the skin every 21 ( twenty-one) days.   pantoprazole (PROTONIX) 40 MG tablet TAKE 1 TABLET BY MOUTH EVERY DAY   VITAMIN D PO Take 1 tablet by mouth daily.   No facility-administered medications prior to visit.         Objective:  Physical Exam   MUSCULOSKELETAL: Approximately 20 degrees of rotation to the right and 45 degrees to the left. Pain localized to the left side of the neck and shoulder, exacerbated by rotation to the right.     General Appearance:  Well  developed, well nourished, no acute distress, by limited video assessment Pulmonary:  No respiratory distress apparent.    Neurological:  Awake, alert. No obvious focal neurological deficits or cognitive impairments.  Sensorium seems unclouded. Psychiatric:  Appropriate mood, pleasant demeanor  calm, articulate, good mood Problem-specific findings: limited by video visit.  Results Reviewed:    CT images not available for reinterpretation, but multiple LDCT and CT angiogram  reports reviewed, no mention of osteoporosis.    ------------------------------------------------------ Attestation:  Today's Healthcare Provider Lula Olszewski, MD was located at home. The patient was located at home. Today's Telemedicine visit was conducted via Video for 70m 36s after consent for telemedicine was obtained:  Video connection was never lost All video encounter participant identities and locations confirmed visually  and verbally.  Signed: Lula Olszewski, MD 10/13/2022 1:51 PM

## 2022-10-13 NOTE — Assessment & Plan Note (Signed)
Osteoporosis: Severe bone loss was noted on a previous bone density test in 2019, and she has not been on any osteoporosis medication due to cost concerns. A repeat bone density test will be ordered. We will initiate the process for obtaining Evenity, a bone-building medication, pending insurance approval and increase Vitamin D intake by adding an additional pill to her current regimen of two pills daily.

## 2022-10-13 NOTE — Patient Instructions (Signed)
VISIT SUMMARY:  During your visit, we discussed your acute neck and shoulder pain, which has been disrupting your daily activities. We believe this pain may be due to nerve irritation or a possible compression fracture, related to your history of osteoporosis. We also discussed your osteoporosis management, considering your previous severe bone loss and your concerns about the cost of medication.  YOUR PLAN:  -NECK PAIN: We suspect your neck pain might be due to nerve irritation or a possible compression fracture. To confirm this, we will order an x-ray of your neck and spine. We have prescribed Medrol Dosepak for immediate pain relief and recommended Celebrex as a safer alternative to ibuprofen for ongoing pain management. If your pain is related to nerve irritation, we may consider starting you on Gabapentin.  -OSTEOPOROSIS: Given your history of severe bone loss and concerns about the cost of medication, we will order a repeat bone density test to assess your current condition. We will also start the process for obtaining Evenity, a medication that helps build bone, pending insurance approval. We will increase your Vitamin D intake by adding an additional pill to your current regimen.  INSTRUCTIONS:  Please follow the prescribed medication regimen for your neck pain. We will follow up in 1-2 weeks to evaluate the effectiveness of these medications, review your x-ray and bone density test results, and finalize the plan for managing your osteoporosis. Please contact the office if your pain worsens or if you have any concerns or questions in the meantime.

## 2022-10-14 ENCOUNTER — Telehealth: Payer: Self-pay | Admitting: Physician Assistant

## 2022-10-14 ENCOUNTER — Encounter: Payer: Self-pay | Admitting: Internal Medicine

## 2022-10-14 NOTE — Telephone Encounter (Signed)
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 Pharmacy ph# [phone].

## 2022-10-17 ENCOUNTER — Encounter: Payer: Self-pay | Admitting: Physical Therapy

## 2022-10-17 ENCOUNTER — Telehealth: Payer: Self-pay | Admitting: Physician Assistant

## 2022-10-17 ENCOUNTER — Ambulatory Visit: Payer: Medicare Other | Admitting: Physical Therapy

## 2022-10-17 DIAGNOSIS — M6281 Muscle weakness (generalized): Secondary | ICD-10-CM

## 2022-10-17 DIAGNOSIS — R262 Difficulty in walking, not elsewhere classified: Secondary | ICD-10-CM

## 2022-10-17 DIAGNOSIS — G8929 Other chronic pain: Secondary | ICD-10-CM | POA: Diagnosis not present

## 2022-10-17 DIAGNOSIS — M25511 Pain in right shoulder: Secondary | ICD-10-CM

## 2022-10-17 DIAGNOSIS — M25512 Pain in left shoulder: Secondary | ICD-10-CM | POA: Diagnosis not present

## 2022-10-17 NOTE — Therapy (Signed)
OUTPATIENT PHYSICAL THERAPY LOWER EXTREMITY Treatment   Patient Name: Jodi Myers MRN: [id number] DOB:[birth year], 72 y.o., female Today's Date: 10/17/2022  END OF SESSION:  PT End of Session - 10/17/22 1020     Visit Number 9    Number of Visits 16    Date for PT Re-Evaluation 11/08/22    PT Start Time 1020    PT Stop Time 1058    PT Time Calculation (min) 38 min    Activity Tolerance Patient tolerated treatment well              Past Medical History:  Diagnosis Date   Anemia 07/2018   bloood transfusion administered    Coronary artery disease    Past Surgical History:  Procedure Laterality Date   TONSILLECTOMY     Patient Active Problem List   Diagnosis Date Noted   Neck pain 10/13/2022   Osteoporosis, reconsidering treatment after previously cost-prohibitive 10/25/2018   Vitamin D deficiency 10/25/2018   Tobacco abuse, started age 59, smokes 1.5 PPD 10/25/2018   Iron deficiency anemia 07/31/2018   Heme + stool 07/19/2018   Pernicious anemia    Pancytopenia (HCC) 01/23/2018   DVT, lower extremity (HCC) 01/23/2018   B12 deficiency 01/23/2018   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 States she has not started her other meds wants to Follow up with Sam.   EVAL:Shoulder pain has been going on for years and some days it can be exhausting because of the pain. States it is difficult to hold her self up with walking because of the pain  in her shoulders and her neck. States that she knows she needs to work on her posture but she just doesn't move that way.  States she see a Land and gets adjustments her and sees a massage therapist 1x/month. States this provides her with temporary relief. Pain is always there. States that she feels like it is a strain for her to improve her posture.   States her knee did give way years ago and caused her to fall and since then she is very careful with that knee and takes stairs one at a time. States if she is on it too much (40 -60 minutes) she has to sit and then after resting she can get back up and move around.  PERTINENT HISTORY: CAD, blind in right eye- has depth perception difficulties PAIN:  Are you having pain?yes 2/10 tightness in left neck.   PRECAUTIONS: None  WEIGHT BEARING RESTRICTIONS: No  FALLS:  Has patient fallen in last 6 months? No  LIVING ENVIRONMENT: Has following equipment at home: None  OCCUPATION: not currently working, likes to run  PLOF: Independent  PATIENT GOALS: to be able to walk and hold herself up better    OBJECTIVE:     PATIENT SURVEYS:  FOTO for knee 50%   COGNITION: Overall cognitive status: Within functional limits for tasks assessed     SENSATION: Not tested  EDEMA/observation:  No swelling observed but B varicose veins noted with left >R  POSTURE: rounded shoulders, forward head, decreased lumbar lordosis, increased thoracic kyphosis, and flexed trunk   PALPATION: Tenderness to palpation along right UT, B rhomboids and pecs   Cervical  A/ROM:    EVAL     Flexion  50% limited (reduced pain)     Extension  50 %limited (pull in neck )     R ROT  50*     L ROT  45*     R SB  18    L SB 5 (tight)     * Pain   (Blank rows = not tested)    UE Measurements Upper Extremity Right EVAL Left EVAL   A/PROM MMT A/PROM MMT  Shoulder Flexion 160 4 145 3+  Shoulder Extension      Shoulder Abduction WFL 4 WFL 4   Shoulder Adduction      Shoulder Internal Rotation Reaches to T12 SP 4- Reaches to T12 SP 4-  Shoulder External Rotation Reaches to C7 SP 4- Reaches to base of head (pulling) 4-  Elbow Flexion      Elbow Extension      Wrist Flexion      Wrist Extension      Wrist Supination      Wrist Pronation      Wrist Ulnar Deviation      Wrist Radial Deviation      Grip Strength NA  NA     (Blank rows = not tested)   * pain  Pt is right handed    SPECIAL/FUNCTIONAL TESTS:  5x sit to stand arms crossed 20.48 SLS L 17 seconds, R 7 seconds (6/18 30 seconds on the right leg) Tandem on floor 30 seconds B minimal sway Tandem on blue foam 30 seconds with moderate sway either leg     GAIT: Distance walked: 50 FT Assistive device utilized: None Level of assistance: Modified independence Comments: limited hip extension and hip mobility., slumped posture looking down, Slower gait   TODAY'S TREATMENT:                                                                                                                              DATE:   10/17/2022  Therapeutic Exercise:  Aerobic:  Supine:   Prone:  Seated: shoulder ER 2x15 5" holds, scapular retraction  2x15 5 "holds, shoulder shrug and depression 2x15 B   Standing: shoulder flexion x5, posture with back up against the wall 5 minutes, shoulder extension 2x12 5" holds B   Neuromuscular Re-education:  Manual Therapy:  Therapeutic Activity: Self Care: Trigger Point Dry Needling:  Modalities:    PATIENT EDUCATION:  Education details: on HEP, on posture on gentle return to exercises. On focus with upcoming massage, on posture and importance of proper set up with exercises and static positions.  Person educated: Patient Education method: Explanation, Demonstration, and Handouts Education comprehension: verbalized understanding   HOME EXERCISE PROGRAM: [id number]  ASSESSMENT:  CLINICAL IMPRESSION: 10/17/2022 Session  focused on review  and update of HEP, Answered all questions and discussed importance of posture and positioning with HEP. Reduced tension noted end of session. Overall patient doing much better on this date, will continue with current POC as toelrated.   Eval: Patient presents with chronic shoulder pain and instability of her right knee. Patient presents with baseline balance deficits and strength deficit in upper and lower extremities as well as base line postural deficits that are likely contributing to current presentation. Patient would greatly benefit from skilled PT to improve overall strength and mobility and reduce risk of falling.   OBJECTIVE IMPAIRMENTS: decreased activity tolerance, decreased balance, decreased knowledge of use of DME, decreased mobility, difficulty walking, decreased ROM, decreased strength, impaired UE functional use, postural dysfunction, and pain.   ACTIVITY LIMITATIONS: standing, stairs, reach over head, and locomotion level  PARTICIPATION LIMITATIONS: meal prep, cleaning, and community activity  PERSONAL FACTORS: Age, Fitness, and Time since onset of injury/illness/exacerbation are also affecting patient's functional outcome.   REHAB POTENTIAL: Good  CLINICAL DECISION MAKING: Stable/uncomplicated  EVALUATION COMPLEXITY: Low   GOALS: Goals reviewed with patient? yes  SHORT TERM GOALS: Target date: 10/11/2022  Patient will be independent in self management strategies to improve quality of life and functional outcomes. Baseline: New Program Goal status: INITIAL  2.  Patient will report at least 25% improvement in overall symptoms and/or function to demonstrate improved functional mobility Baseline: 0% better Goal status: INITIAL  3.  Patient will be able to demonstrate at least 55 degrees of neck ROT in both directions Baseline: see above Goal status: INITIAL       LONG TERM GOALS: Target date: 11/08/2022   Patient will report at least 50% improvement in overall  symptoms and/or function to demonstrate improved functional mobility Baseline: 0% better Goal status: INITIAL  2.  Patient will improve score on FOTO outcomes measure to projected score to demonstrate overall improved function and QOL Baseline: see above Goal status: INITIAL  3.  Patient will be able to stand on either leg for at least 30 seconds without UE support to demonstrate improved static balance. Baseline: see above Goal status: INITIAL   PLAN:  PT FREQUENCY: 2x/week  PT DURATION: 8 weeks  PLANNED INTERVENTIONS: Therapeutic exercises, Therapeutic activity, Neuromuscular re-education, Balance training, Gait training, Patient/Family education, Self Care, Joint mobilization, Joint manipulation, Stair training, Vestibular training, Canalith repositioning, Orthotic/Fit training, Prosthetic training, DME instructions, Aquatic Therapy, Dry Needling, Electrical stimulation, Spinal manipulation, Spinal mobilization, Cryotherapy, Moist heat, Taping, Traction, Ultrasound, Ionotophoresis 4mg /ml Dexamethasone, Manual therapy, and Re-evaluation.   PLAN FOR NEXT SESSION: DN? Neck  balance, posture, LE strengthening - unstable surfaces - stairs   10:20 AM, 10/17/22 Tereasa Coop, DPT Physical Therapy with [facility]

## 2022-10-17 NOTE — Telephone Encounter (Signed)
Dr. Jon Billings please contact [facility] to clarify directions for medication you ordered Evenity. Please call [phone]. Thanks

## 2022-10-17 NOTE — Telephone Encounter (Signed)
Caller is Fleet Contras from [employer]. Caller states they needed clarification on the romosozumab-aqqg 105 mg. States that it says to inject 210 mg into the skin but doesn't tell how often this should be done (I.e once a day, three times a day, etc).

## 2022-10-17 NOTE — Telephone Encounter (Signed)
Spoke to pt told her I received a phone call about a Rx,  but we did not order it and I tried to call [facility] back but was disconnected. Pt said Dr. Jon Billings ordered it was not sure of name. I looked through note and saw Evenity was ordered. Pt said that is it. Told her I will forward message to Dr. Jon Billings due to I do not even see it on your med list to get clarification. Pt verbalized understanding and said he needs to contact [facility] to clarify. Told her okay, I will send message.

## 2022-10-17 NOTE — Telephone Encounter (Signed)
Tried to call [facility] spoke to a Byrd Hesselbach, told her returning a call from the pharmacy, was transferred then disconnected.

## 2022-10-18 ENCOUNTER — Encounter (INDEPENDENT_AMBULATORY_CARE_PROVIDER_SITE_OTHER): Payer: Medicare Other | Admitting: Ophthalmology

## 2022-10-18 DIAGNOSIS — H318 Other specified disorders of choroid: Secondary | ICD-10-CM | POA: Diagnosis not present

## 2022-10-18 DIAGNOSIS — D1809 Hemangioma of other sites: Secondary | ICD-10-CM | POA: Diagnosis not present

## 2022-10-18 DIAGNOSIS — H43813 Vitreous degeneration, bilateral: Secondary | ICD-10-CM | POA: Diagnosis not present

## 2022-10-18 NOTE — Telephone Encounter (Signed)
Spoke with pharmacy and was told that this medication is possibly not covered by the patient's insurance. The clarification of directions had already been resolved. Spoke with patient and advised her to call her insurance company to verify for certain and call the office to let us know.

## 2022-10-18 NOTE — Telephone Encounter (Signed)
I was able to clarify  Evenity (romosozumab) is indicated for the treatment of osteoporosis in postmenopausal women at high risk for fracture. The recommended dose is 210 mg administered subcutaneously once monthly, given as two separate injections of 105 mg each. Treatment duration should not exceed 12 months, after which patients should transition to an antiresorptive therapy. Evenity is contraindicated in patients with hypocalcemia and those with a history of systemic hypersensitivity to the drug. Key warnings include an increased risk of cardiovascular events, hypocalcemia, osteonecrosis of the jaw, and atypical femoral fractures. Common adverse reactions include arthralgia and headache. It is important to monitor patients for signs of cardiovascular events, hypocalcemia, and atypical femoral fractures. Evenity is not indicated for use in pregnant or lactating women, and its safety and effectiveness in pediatric patients have not been established.  Lab Results  Component Value Date/Time   CALCIUM 10.0 05/09/2022 09:55 AM   CALCIUM 9.6 02/11/2022 03:48 PM   CALCIUM 9.9 06/08/2021 09:18 AM   CALCIUM 9.7 07/15/2020 10:27 AM   CALCIUM 10.0 01/15/2020 11:12 AM  No results found for: "25OHVITD2", "25OHVITD3", "VD25OH"  has Pancytopenia (HCC); DVT, lower extremity (HCC); B12 deficiency; Pernicious anemia; Heme + stool; Iron deficiency anemia; Ocular histoplasmosis syndrome; Osteoporosis, reconsidering treatment after previously cost-prohibitive; Vitamin D deficiency; Tobacco abuse, started age 47, smokes 1.5 PPD; and Neck pain on their problem list.   is allergic to betadine [povidone iodine], mango flavor, poison ivy extract, and sporanox [itraconazole].

## 2022-10-20 ENCOUNTER — Encounter: Payer: Medicare Other | Admitting: Physical Therapy

## 2022-10-20 NOTE — Progress Notes (Signed)
Subject: Your Recent Spine X-Ray Results and Follow-Up Plan  Dear Jodi Myers,  I hope this message finds you managing your pain as best as possible. I'm writing to inform you about your recent spine x-ray results and our plan moving forward. We'll discuss this in more detail during your follow-up appointment next week, but I wanted to provide you with an overview.  X-Ray Results: The x-rays of your cervical (neck), thoracic (mid-back), and lumbar (lower back) spine show:  No evidence of acute fractures or traumatic misalignment in any area of your spine. Multilevel degenerative changes (spondylosis) throughout your spine, most advanced in your lower back. Disc space narrowing and changes to the vertebral endplates, most pronounced between the C3 and C4 vertebrae in your neck. Mild to moderate arthritis in the small joints of your spine (facet joints). Some curvature (right convex thoracolumbar curve) in your mid to lower back. Signs of decreased bone density, which can make it harder to see subtle fractures on x-ray.  What This Means: These findings help explain your chronic pain. The degenerative changes, particularly in your lower back, can irritate nerves and cause pain. While we don't see any acute fractures, the decreased bone density reminds [place] to be vigilant about your osteoporosis management.  Current Plan: Complete the prescribed Medrol Dosepak for acute pain relief. Use Celebrex 200mg  twice daily as discussed for ongoing pain management. Encourage trialing the low dose of Gabapentin for potential nerve-related pain at your next visit. Continue with your osteoporosis management to improve bone density (vitamin D) and exercise   Next Setps: Attend your follow-up appointment with Primary Care Provider (PCP)  as scheduled. Consider purchasing a TENS unit at a pharmacy or [facility], and see if it helps the pain if it is persisting.  At the appointment, we'll:  Review how you're  responding to the current pain management plan. Discuss adjusting  Gabapentin and any concerns about sedation. Consider referral to a spine specialist or pain management clinic or physical therapy if needed. Review your osteoporosis treatment plan  Remember, while these x-rays show significant degenerative changes, many people with similar findings can achieve good pain control with the right treatment plan. We're here to work with you to manage your pain and maintain your quality of life.  If you have any urgent concerns before your appointment, please don't hesitate to contact our office.  Best regards, Lula Olszewski, MD  10/20/2022 7:50 AM   [facility]:  [phone]

## 2022-10-24 ENCOUNTER — Encounter: Payer: Self-pay | Admitting: Physical Therapy

## 2022-10-24 ENCOUNTER — Ambulatory Visit: Payer: Medicare Other | Admitting: Physical Therapy

## 2022-10-24 DIAGNOSIS — M25512 Pain in left shoulder: Secondary | ICD-10-CM | POA: Diagnosis not present

## 2022-10-24 DIAGNOSIS — M6281 Muscle weakness (generalized): Secondary | ICD-10-CM

## 2022-10-24 DIAGNOSIS — R262 Difficulty in walking, not elsewhere classified: Secondary | ICD-10-CM

## 2022-10-24 DIAGNOSIS — M25511 Pain in right shoulder: Secondary | ICD-10-CM

## 2022-10-24 DIAGNOSIS — G8929 Other chronic pain: Secondary | ICD-10-CM

## 2022-10-24 NOTE — Therapy (Signed)
OUTPATIENT PHYSICAL THERAPY LOWER EXTREMITY Treatment   Patient Name: Jodi Myers MRN: [id number] DOB:09-Aug-1950, 72 y.o., female Today's Date: 10/24/2022  END OF SESSION:  PT End of Session - 10/24/22 0937     Visit Number 10    Number of Visits 16    Date for PT Re-Evaluation 11/08/22    PT Start Time 0937    PT Stop Time 1010    PT Time Calculation (min) 33 min    Activity Tolerance Patient tolerated treatment well              Past Medical History:  Diagnosis Date   Anemia 07/2018   bloood transfusion administered    Coronary artery disease    Past Surgical History:  Procedure Laterality Date   TONSILLECTOMY     Patient Active Problem List   Diagnosis Date Noted   Neck pain 10/13/2022   Osteoporosis, reconsidering treatment after previously cost-prohibitive 10/25/2018   Vitamin D deficiency 10/25/2018   Tobacco abuse, started age 26, smokes 1.5 PPD 10/25/2018   Iron deficiency anemia 07/31/2018   Heme + stool 07/19/2018   Pernicious anemia    Pancytopenia (HCC) 01/23/2018   DVT, lower extremity (HCC) 01/23/2018   B12 deficiency 01/23/2018   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 Follows up with Sam tomorrow. States she has been practicing going down the stairs the normal way. States her knee feels about 70% better and her neck feels about 50% better   EVAL:Shoulder pain has been going on for years and some  days it can be exhausting because of the pain. States it is difficult to hold her self up with walking because of the pain in her shoulders and her neck. States that she knows she needs to work on her posture but she just doesn't move that way.  States she see a [facility] and gets adjustments her and sees a massage therapist 1x/month. States this provides her with temporary relief. Pain is always there. States that she feels like it is a strain for her to improve her posture.   States her knee did give way years ago and caused her to fall and since then she is very careful with that knee and takes stairs one at a time. States if she is on it too much (40 -60 minutes) she has to sit and then after resting she can get back up and move around.  PERTINENT HISTORY: CAD, blind in right eye- has depth perception difficulties PAIN:  Are you having pain?yes 0/10 tightness in left neck.   PRECAUTIONS: None  WEIGHT BEARING RESTRICTIONS: No  FALLS:  Has patient fallen in last 6 months? No  LIVING ENVIRONMENT: Has following equipment at home: None  OCCUPATION: not currently working, likes to run  PLOF: Independent  PATIENT GOALS: to be able to walk and hold herself up better    OBJECTIVE:     PATIENT SURVEYS:  FOTO for knee 66%   COGNITION: Overall cognitive status: Within functional limits for  tasks assessed     SENSATION: Not tested  EDEMA/observation:  No swelling observed but B varicose veins noted with left >R  POSTURE: rounded shoulders, forward head, decreased lumbar lordosis, increased thoracic kyphosis, and flexed trunk   PALPATION: Tenderness to palpation along right UT, B rhomboids and pecs   Cervical  A/ROM:    EVAL 10/24/22    Flexion  50% limited (reduced pain)  50% limites (pulling)    Extension  50 %limited (pull in neck ) 50% limited    R ROT  50*  50   L ROT  45*  45   R SB  18  15  L SB 5 (tight) 15    * Pain   (Blank rows = not tested)    UE  Measurements Upper Extremity Right 10/24/22 Left 10/24/22   A/PROM MMT A/PROM MMT  Shoulder Flexion 145 4 145 4  Shoulder Extension      Shoulder Abduction WFL 4 WFL 4  Shoulder Adduction      Shoulder Internal Rotation Reaches to T12 SP 4 Reaches to T12 SP 4-  Shoulder External Rotation Reaches to C7 SP 4 Reaches to C7 SP 4  Elbow Flexion      Elbow Extension      Wrist Flexion      Wrist Extension      Wrist Supination      Wrist Pronation      Wrist Ulnar Deviation      Wrist Radial Deviation      Grip Strength NA  NA     (Blank rows = not tested)   * pain  Pt is right handed    SPECIAL/FUNCTIONAL TESTS:  5x sit to stand arms crossed 17.45 SLS L 17 seconds, R 7 seconds (6/18 30 seconds on the right leg), 7/22 left SLS 30 second  mod sway, R SLS 30 seconds moderate sway Tandem on floor 30 seconds B minimal sway Tandem on blue foam 30 seconds with moderate sway either leg     TODAY'S TREATMENT:                                                                                                                              DATE:   10/24/2022  Therapeutic Exercise: Objective measurements updated  Neuromuscular Re-education:  Manual Therapy:  Therapeutic Activity: Self Care: Trigger Point Dry Needling:  Modalities:    PATIENT EDUCATION:  Education details: on HEP, on FOTO score, on progress made  Person educated: Patient Education method: Explanation, Demonstration, and Handouts Education comprehension: verbalized understanding   HOME EXERCISE PROGRAM: [id number]  ASSESSMENT:  CLINICAL IMPRESSION: 10/24/2022 Patient present for progress note on this date overall she is doing very well.  Patient has met all but 1 short-term goal at this time and has met all long-term goals at this time.  Reviewed entire home exercise program and answered all questions.  Patient to discharge from PT to HEP secondary  to progress made and satisfaction with current functional  status.  Eval: Patient presents with chronic shoulder pain and instability of her right knee. Patient presents with baseline balance deficits and strength deficit in upper and lower extremities as well as base line postural deficits that are likely contributing to current presentation. Patient would greatly benefit from skilled PT to improve overall strength and mobility and reduce risk of falling.   OBJECTIVE IMPAIRMENTS: decreased activity tolerance, decreased balance, decreased knowledge of use of DME, decreased mobility, difficulty walking, decreased ROM, decreased strength, impaired UE functional use, postural dysfunction, and pain.   ACTIVITY LIMITATIONS: standing, stairs, reach over head, and locomotion level  PARTICIPATION LIMITATIONS: meal prep, cleaning, and community activity  PERSONAL FACTORS: Age, Fitness, and Time since onset of injury/illness/exacerbation are also affecting patient's functional outcome.   REHAB POTENTIAL: Good  CLINICAL DECISION MAKING: Stable/uncomplicated  EVALUATION COMPLEXITY: Low   GOALS: Goals reviewed with patient? yes  SHORT TERM GOALS: Target date: 10/11/2022  Patient will be independent in self management strategies to improve quality of life and functional outcomes. Baseline: New Program Goal status: Progressing   2.  Patient will report at least 25% improvement in overall symptoms and/or function to demonstrate improved functional mobility Baseline: 0% better Goal status: MET  3.  Patient will be able to demonstrate at least 55 degrees of neck ROT in both directions Baseline: see above Goal status: Progressing        LONG TERM GOALS: Target date: 11/08/2022   Patient will report at least 50% improvement in overall symptoms and/or function to demonstrate improved functional mobility Baseline: 0% better Goal status: MET  2.  Patient will improve score on FOTO outcomes measure to projected score to demonstrate overall improved function  and QOL Baseline: see above Goal status: MET  3.  Patient will be able to stand on either leg for at least 30 seconds without UE support to demonstrate improved static balance. Baseline: see above Goal status: Met   PLAN:  PT FREQUENCY: 2x/week  PT DURATION: 8 weeks  PLANNED INTERVENTIONS: Therapeutic exercises, Therapeutic activity, Neuromuscular re-education, Balance training, Gait training, Patient/Family education, Self Care, Joint mobilization, Joint manipulation, Stair training, Vestibular training, Canalith repositioning, Orthotic/Fit training, Prosthetic training, DME instructions, Aquatic Therapy, Dry Needling, Electrical stimulation, Spinal manipulation, Spinal mobilization, Cryotherapy, Moist heat, Taping, Traction, Ultrasound, Ionotophoresis 4mg /ml Dexamethasone, Manual therapy, and Re-evaluation.   PLAN FOR NEXT SESSION: DC to HEP   10:15 AM, 10/24/22 Tereasa Coop, DPT Physical Therapy with [facility]

## 2022-10-25 ENCOUNTER — Other Ambulatory Visit (HOSPITAL_BASED_OUTPATIENT_CLINIC_OR_DEPARTMENT_OTHER): Payer: Self-pay

## 2022-10-25 ENCOUNTER — Ambulatory Visit (INDEPENDENT_AMBULATORY_CARE_PROVIDER_SITE_OTHER): Payer: Medicare Other | Admitting: Physician Assistant

## 2022-10-25 ENCOUNTER — Encounter: Payer: Self-pay | Admitting: Hematology

## 2022-10-25 ENCOUNTER — Encounter: Payer: Self-pay | Admitting: Physician Assistant

## 2022-10-25 ENCOUNTER — Other Ambulatory Visit: Payer: Self-pay | Admitting: *Deleted

## 2022-10-25 VITALS — BP 136/80 | HR 85 | Temp 97.3°F | Ht 66.0 in | Wt 179.0 lb

## 2022-10-25 DIAGNOSIS — M810 Age-related osteoporosis without current pathological fracture: Secondary | ICD-10-CM

## 2022-10-25 DIAGNOSIS — R21 Rash and other nonspecific skin eruption: Secondary | ICD-10-CM

## 2022-10-25 DIAGNOSIS — E2839 Other primary ovarian failure: Secondary | ICD-10-CM | POA: Diagnosis not present

## 2022-10-25 DIAGNOSIS — M542 Cervicalgia: Secondary | ICD-10-CM | POA: Diagnosis not present

## 2022-10-25 MED ORDER — ASPIRIN 81 MG PO TBEC
81.0000 mg | DELAYED_RELEASE_TABLET | Freq: Every day | ORAL | 3 refills | Status: DC
  Filled 2022-10-25: qty 90, 90d supply, fill #0
  Filled 2023-03-01: qty 90, 90d supply, fill #1
  Filled 2023-04-27: qty 90, 90d supply, fill #2
  Filled 2023-08-28: qty 90, 90d supply, fill #3

## 2022-10-25 MED ORDER — PANTOPRAZOLE SODIUM 40 MG PO TBEC
40.0000 mg | DELAYED_RELEASE_TABLET | Freq: Every day | ORAL | 1 refills | Status: DC
  Filled 2022-10-25: qty 90, 90d supply, fill #0

## 2022-10-25 MED ORDER — ALBUTEROL SULFATE HFA 108 (90 BASE) MCG/ACT IN AERS
2.0000 | INHALATION_SPRAY | Freq: Four times a day (QID) | RESPIRATORY_TRACT | 2 refills | Status: DC | PRN
Start: 2022-10-25 — End: 2023-12-12
  Filled 2022-10-25 – 2023-04-27 (×2): qty 6.7, 25d supply, fill #0
  Filled 2023-07-16: qty 6.7, 25d supply, fill #1

## 2022-10-25 MED ORDER — TRIAMCINOLONE ACETONIDE 0.1 % EX CREA
1.0000 | TOPICAL_CREAM | Freq: Two times a day (BID) | CUTANEOUS | 1 refills | Status: DC
Start: 2022-10-25 — End: 2023-12-12
  Filled 2022-10-25: qty 80, 40d supply, fill #0
  Filled 2023-04-27: qty 80, 40d supply, fill #1

## 2022-10-25 MED ORDER — ATORVASTATIN CALCIUM 40 MG PO TABS
40.0000 mg | ORAL_TABLET | Freq: Every day | ORAL | 1 refills | Status: DC
  Filled 2022-10-25 – 2022-12-02 (×3): qty 90, 90d supply, fill #0
  Filled 2023-03-01 – 2023-03-06 (×3): qty 90, 90d supply, fill #1

## 2022-10-25 NOTE — Progress Notes (Signed)
Jodi Myers is a 72 y.o. female here for a follow up of a pre-existing problem.  History of Present Illness:   Chief Complaint  Patient presents with   Results    Pt would like to discuss x-ray results done on 7/11.    HPI  Rash: She is using 0.1% triamcinolone cream to manage her skin irritation under her left breast.  She is requesting a refill on it as well.    Pinched nerve:  She complains of an acute episode of pinched nerve in her back causing her pain in her back and knees.  She received oral steroids to manage her pain.  She was also given celebrex and gabapentin to manage her pain but did not want to take celebrex due to its pricing and potential side effect(s).  She has not started gabapentin due to apprehensions of its side effects and is not planning on taking it.  She followed up with her physical therapist and reports her pain improved after seeing her.  She notes developing neck pain during her at home physical therapy exercise.  She is interested in receiving a bone density scan.    Past Medical History:  Diagnosis Date   Anemia 07/2018   bloood transfusion administered    Coronary artery disease      Social History   Tobacco Use   Smoking status: Every Day    Current packs/day: 1.50    Average packs/day: 1.5 packs/day for 49.0 years (73.5 ttl pk-yrs)    Types: Cigarettes   Smokeless tobacco: Never  Vaping Use   Vaping status: Never Used  Substance Use Topics   Alcohol use: Yes    Alcohol/week: 7.0 standard drinks of alcohol    Types: 7 Cans of beer per week    Comment: a beer a day   Drug use: Never    Past Surgical History:  Procedure Laterality Date   TONSILLECTOMY      Family History  Problem Relation Age of Onset   Diabetes Mellitus II Mother    Rheum arthritis Mother    CVA Mother    Diabetes Mellitus I Sister    Hashimoto's thyroiditis Sister    Heart attack Father    Colon cancer Neg Hx    Liver disease Neg Hx      Allergies  Allergen Reactions   Betadine [Povidone Iodine] Itching   Mango Flavor Other (See Comments)    Blisters, JUST MANGO, skin of the mango   Poison Ivy Extract Other (See Comments)    Blisters   Sporanox [Itraconazole] Other (See Comments)    Blisters    Current Medications:   Current Outpatient Medications:    acetaminophen (TYLENOL) 325 MG tablet, Take 2 tablets (650 mg total) by mouth every 6 (six) hours as needed for mild pain (or Fever >/= 101)., Disp: 20 tablet, Rfl: 0   albuterol (VENTOLIN HFA) 108 (90 Base) MCG/ACT inhaler, Inhale 2 puffs into the lungs every 6 (six) hours as needed for wheezing or shortness of breath., Disp: 8 g, Rfl: 0   ASPIRIN LOW DOSE 81 MG EC tablet, TAKE 1 TABLET BY MOUTH EVERY DAY, Disp: 90 tablet, Rfl: 3   atorvastatin (LIPITOR) 40 MG tablet, Take 1 tablet (40 mg total) by mouth daily., Disp: 90 tablet, Rfl: 1   CALCIUM PO, Take 1 tablet by mouth daily., Disp: , Rfl:    cyanocobalamin (,VITAMIN B-12,) 1000 MCG/ML injection, Inject 1 mL (1,000 mcg total) into the skin every 21 (  twenty-one) days., Disp: 12 mL, Rfl: 3   pantoprazole (PROTONIX) 40 MG tablet, TAKE 1 TABLET BY MOUTH EVERY DAY, Disp: 90 tablet, Rfl: 0   triamcinolone cream (KENALOG) 0.1 %, Apply 1 Application topically 2 (two) times daily., Disp: 80 g, Rfl: 1   VITAMIN D PO, Take 1 tablet by mouth daily., Disp: , Rfl:    Review of Systems:   Review of Systems  Skin:        (+)skin irritation under left breast    Vitals:   Vitals:   10/25/22 0902  BP: 136/80  Pulse: 85  Temp: (!) 97.3 F (36.3 C)  TempSrc: Temporal  SpO2: 94%  Weight: 179 lb (81.2 kg)  Height: 5\' 6"  (1.676 m)     Body mass index is 28.89 kg/m.  Physical Exam:   Physical Exam Vitals and nursing note reviewed.  Constitutional:      General: She is not in acute distress.    Appearance: She is well-developed. She is not ill-appearing or toxic-appearing.  Cardiovascular:     Rate and Rhythm:  Normal rate and regular rhythm.     Pulses: Normal pulses.     Heart sounds: Normal heart sounds, S1 normal and S2 normal.  Pulmonary:     Effort: Pulmonary effort is normal.     Breath sounds: Normal breath sounds.  Skin:    General: Skin is warm and dry.  Neurological:     Mental Status: She is alert.     GCS: GCS eye subscore is 4. GCS verbal subscore is 5. GCS motor subscore is 6.  Psychiatric:        Speech: Speech normal.        Behavior: Behavior normal. Behavior is cooperative.     Assessment and Plan:   Rash Not examined Stable - will refill triamcinolone Follow-up prn  Neck pain Improved overall Denies any further needs  Osteoporosis, unspecified osteoporosis type, unspecified pathological fracture presence; Estrogen deficiency Order bone density test and consider bisphosphonate   I,Shehryar Baig,acting as a scribe for [employer], PA.,have documented all relevant documentation on the behalf of Jarold Motto, PA,as directed by  Jarold Motto, PA while in the presence of Jarold Motto, [place].  I, Jarold Motto, [place], have reviewed all documentation for this visit. The documentation on 10/25/22 for the exam, diagnosis, procedures, and orders are all accurate and complete.  Jarold Motto, PA-C

## 2022-10-25 NOTE — Patient Instructions (Signed)
It was great to see you!  Schedule bone density with our front desk for [place] [facility] and I will be in touch with these results  I will refill your ointment  Take care,  Jarold Motto PA-C

## 2022-10-27 ENCOUNTER — Encounter: Payer: Medicare Other | Admitting: Physical Therapy

## 2022-10-31 ENCOUNTER — Encounter: Payer: Medicare Other | Admitting: Physical Therapy

## 2022-11-03 ENCOUNTER — Inpatient Hospital Stay: Admission: RE | Admit: 2022-11-03 | Payer: Medicare Other | Source: Ambulatory Visit

## 2022-11-03 ENCOUNTER — Encounter: Payer: Medicare Other | Admitting: Physical Therapy

## 2022-11-07 ENCOUNTER — Other Ambulatory Visit: Payer: Self-pay

## 2022-11-07 DIAGNOSIS — D509 Iron deficiency anemia, unspecified: Secondary | ICD-10-CM

## 2022-11-08 ENCOUNTER — Inpatient Hospital Stay: Payer: Medicare Other | Attending: Hematology

## 2022-11-08 ENCOUNTER — Other Ambulatory Visit: Payer: Self-pay

## 2022-11-08 ENCOUNTER — Ambulatory Visit (INDEPENDENT_AMBULATORY_CARE_PROVIDER_SITE_OTHER)
Admission: RE | Admit: 2022-11-08 | Discharge: 2022-11-08 | Disposition: A | Discharge status: Home / Self Care | Payer: Medicare Other | Source: Ambulatory Visit | Attending: Physician Assistant | Admitting: Physician Assistant

## 2022-11-08 DIAGNOSIS — Z7982 Long term (current) use of aspirin: Secondary | ICD-10-CM | POA: Insufficient documentation

## 2022-11-08 DIAGNOSIS — E2839 Other primary ovarian failure: Secondary | ICD-10-CM | POA: Diagnosis not present

## 2022-11-08 DIAGNOSIS — Z86718 Personal history of other venous thrombosis and embolism: Secondary | ICD-10-CM | POA: Diagnosis not present

## 2022-11-08 DIAGNOSIS — F17210 Nicotine dependence, cigarettes, uncomplicated: Secondary | ICD-10-CM | POA: Insufficient documentation

## 2022-11-08 DIAGNOSIS — D509 Iron deficiency anemia, unspecified: Secondary | ICD-10-CM

## 2022-11-08 DIAGNOSIS — D510 Vitamin B12 deficiency anemia due to intrinsic factor deficiency: Secondary | ICD-10-CM | POA: Diagnosis not present

## 2022-11-08 LAB — CMP (CANCER CENTER ONLY)
ALT: 10 U/L (ref 0–44)
AST: 13 U/L — ABNORMAL LOW (ref 15–41)
Albumin: 3.7 g/dL (ref 3.5–5.0)
Alkaline Phosphatase: 92 U/L (ref 38–126)
Anion gap: 7 (ref 5–15)
BUN: 20 mg/dL (ref 8–23)
CO2: 28 mmol/L (ref 22–32)
Calcium: 9 mg/dL (ref 8.9–10.3)
Chloride: 103 mmol/L (ref 98–111)
Creatinine: 0.98 mg/dL (ref 0.44–1.00)
GFR, Estimated: >60 mL/min (ref >60)
Glucose, Bld: 99 mg/dL (ref 70–99)
Potassium: 4.3 mmol/L (ref 3.5–5.1)
Sodium: 138 mmol/L (ref 135–145)
Total Bilirubin: 0.6 mg/dL (ref 0.3–1.2)
Total Protein: 6.9 g/dL (ref 6.5–8.1)

## 2022-11-08 LAB — CBC WITH DIFFERENTIAL (CANCER CENTER ONLY)
Abs Immature Granulocytes: 0.01 10*3/uL (ref 0.00–0.07)
Basophils Absolute: 0 10*3/uL (ref 0.0–0.1)
Basophils Relative: 1 %
Eosinophils Absolute: 0.2 10*3/uL (ref 0.0–0.5)
Eosinophils Relative: 3 %
HCT: 44.1 % (ref 36.0–46.0)
Hemoglobin: 14.5 g/dL (ref 12.0–15.0)
Immature Granulocytes: 0 %
Lymphocytes Relative: 18 %
Lymphs Abs: 1 10*3/uL (ref 0.7–4.0)
MCH: 30.7 pg (ref 26.0–34.0)
MCHC: 32.9 g/dL (ref 30.0–36.0)
MCV: 93.2 fL (ref 80.0–100.0)
Monocytes Absolute: 0.6 10*3/uL (ref 0.1–1.0)
Monocytes Relative: 10 %
Neutro Abs: 4 10*3/uL (ref 1.7–7.7)
Neutrophils Relative %: 68 %
Platelet Count: 145 10*3/uL — ABNORMAL LOW (ref 150–400)
RBC: 4.73 MIL/uL (ref 3.87–5.11)
RDW: 14 % (ref 11.5–15.5)
WBC Count: 5.8 10*3/uL (ref 4.0–10.5)
nRBC: 0 % (ref 0.0–0.2)

## 2022-11-08 LAB — VITAMIN B12: Vitamin B-12: 377 pg/mL (ref 180–914)

## 2022-11-08 LAB — FERRITIN: Ferritin: 18 ng/mL (ref 11–307)

## 2022-11-08 LAB — IRON AND IRON BINDING CAPACITY (CC-WL,HP ONLY)
Iron: 102 ug/dL (ref 28–170)
Saturation Ratios: 28 % (ref 10.4–31.8)
TIBC: 363 ug/dL (ref 250–450)
UIBC: 261 ug/dL (ref 148–442)

## 2022-11-09 ENCOUNTER — Telehealth: Payer: Medicare Other | Admitting: Hematology

## 2022-11-09 ENCOUNTER — Other Ambulatory Visit (HOSPITAL_COMMUNITY): Payer: Self-pay

## 2022-11-09 ENCOUNTER — Telehealth: Payer: Self-pay | Admitting: Pharmacy Technician

## 2022-11-09 NOTE — Telephone Encounter (Signed)
Pharmacy Patient Advocate Encounter   Received notification from CoverMyMeds that prior authorization for Evenity 105MG /1.17ML syringes is required/requested.   Insurance verification completed.   The patient is insured through  [employer]  .   Per test claim: PA required; PA submitted to [employer] medicare via CoverMyMeds Key/confirmation #/EOC [id number] Status is pending

## 2022-11-10 ENCOUNTER — Encounter: Payer: Self-pay | Admitting: Hematology

## 2022-11-10 ENCOUNTER — Other Ambulatory Visit: Payer: Self-pay | Admitting: Physician Assistant

## 2022-11-10 ENCOUNTER — Inpatient Hospital Stay: Payer: Medicare Other | Admitting: Hematology

## 2022-11-10 ENCOUNTER — Other Ambulatory Visit (HOSPITAL_BASED_OUTPATIENT_CLINIC_OR_DEPARTMENT_OTHER): Payer: Self-pay

## 2022-11-10 DIAGNOSIS — F17210 Nicotine dependence, cigarettes, uncomplicated: Secondary | ICD-10-CM | POA: Diagnosis not present

## 2022-11-10 DIAGNOSIS — Z86718 Personal history of other venous thrombosis and embolism: Secondary | ICD-10-CM | POA: Diagnosis not present

## 2022-11-10 DIAGNOSIS — D510 Vitamin B12 deficiency anemia due to intrinsic factor deficiency: Secondary | ICD-10-CM

## 2022-11-10 DIAGNOSIS — D509 Iron deficiency anemia, unspecified: Secondary | ICD-10-CM

## 2022-11-10 DIAGNOSIS — Z7982 Long term (current) use of aspirin: Secondary | ICD-10-CM | POA: Diagnosis not present

## 2022-11-10 DIAGNOSIS — E538 Deficiency of other specified B group vitamins: Secondary | ICD-10-CM

## 2022-11-10 MED ORDER — CYANOCOBALAMIN 1000 MCG/ML IJ SOLN
1000.0000 ug | INTRAMUSCULAR | 3 refills | Status: DC
Start: 2022-11-10 — End: 2024-01-09
  Filled 2022-11-10: qty 4, 84d supply, fill #0
  Filled 2023-04-27: qty 4, 84d supply, fill #1
  Filled 2023-08-24: qty 4, 84d supply, fill #2

## 2022-11-10 NOTE — Progress Notes (Signed)
HEMATOLOGY/ONCOLOGY PHONE VISIT NOTE  Date of Service: 11/10/22    Patient Care Team: Jarold Motto, [place] as PCP - General (Physician Assistant) Sherrie George, MD as Consulting Physician (Ophthalmology) Johney Maine, MD as Consulting Physician (Hematology)  CHIEF COMPLAINTS/PURPOSE OF CONSULTATION:  Follow-up for pernicious anemia causing B12 and iron deficiency  HISTORY OF PRESENTING ILLNESS:  Please see previous note for details on initial presentation.  Interval History:  Jodi Myers is here for her scheduled follow-up for continued evaluation and management of pernicious anemia.  Patient was last seen by me on 05/11/2022 and complained of bilateral shoulder pain and neck pain, but was otherwise doing well overall.  I connected with Caryn Section on 11/10/22 at  8:40 AM EDT by telephone visit and verified that I am speaking with the correct person using two identifiers.   I discussed the limitations, risks, security and privacy concerns of performing an evaluation and management service by telemedicine and the availability of in-person appointments. I also discussed with the patient that there may be a patient responsible charge related to this service. The patient expressed understanding and agreed to proceed.   Other persons participating in the visit and their role in the encounter: none   Patient's location: home  Provider's location: [facility]   Chief Complaint: continued evaluation and management of pernicious anemia    Today, she reports that she has been feeling well overall over the last 6 months. Patient was recently started on Atorvastatin and did receive steroids for previous pinched nerve. She denies any other major medication changes. Patient continues to take low dose Aspirin. She denies any black stools or blood in the stools.   Patient continues to receive one injection of vitamin B12 every 3 weeks with no missed doses and denies any toxicity  issues.  MEDICAL HISTORY:  Past Medical History:  Diagnosis Date   Anemia 07/2018   bloood transfusion administered    Coronary artery disease     SURGICAL HISTORY: Past Surgical History:  Procedure Laterality Date   TONSILLECTOMY      SOCIAL HISTORY: Social History   Socioeconomic History   Marital status: Married    Spouse name: Not on file   Number of children: Not on file   Years of education: Not on file   Highest education level: Some college, no degree  Occupational History   Occupation: retired  Tobacco Use   Smoking status: Every Day    Current packs/day: 1.50    Average packs/day: 1.5 packs/day for 49.0 years (73.5 ttl pk-yrs)    Types: Cigarettes   Smokeless tobacco: Never  Vaping Use   Vaping status: Never Used  Substance and Sexual Activity   Alcohol use: Yes    Alcohol/week: 7.0 standard drinks of alcohol    Types: 7 Cans of beer per week    Comment: a beer a day   Drug use: Never   Sexual activity: Not on file  Other Topics Concern   Not on file  Social History Narrative   Not on file   Social Determinants of Health   Financial Resource Strain: Low Risk  (10/24/2022)   Overall Financial Resource Strain (CARDIA)    Difficulty of Paying Living Expenses: Not hard at all  Food Insecurity: No Food Insecurity (10/24/2022)   Hunger Vital Sign    Worried About Running Out of Food in the Last Year: Never true    Ran Out of Food in the Last Year: Never true  Transportation Needs: No Transportation Needs (10/24/2022)   PRAPARE - Administrator, Civil Service (Medical): No    Lack of Transportation (Non-Medical): No  Physical Activity: Insufficiently Active (10/24/2022)   Exercise Vital Sign    Days of Exercise per Week: 3 days    Minutes of Exercise per Session: 20 min  Stress: No Stress Concern Present (10/24/2022)   [name] - Occupational Stress Questionnaire    Feeling of Stress : Not at all  Social  Connections: Moderately Isolated (10/24/2022)   Social Connection and Isolation Panel [NHANES]    Frequency of Communication with Friends and Family: More than three times a week    Frequency of Social Gatherings with Friends and Family: Three times a week    Attends Religious Services: Never    Active Member of Clubs or Organizations: No    Attends Banker Meetings: Not on file    Marital Status: Married  Catering manager Violence: Not At Risk (05/03/2022)   Humiliation, Afraid, Rape, and Kick questionnaire    Fear of Current or Ex-Partner: No    Emotionally Abused: No    Physically Abused: No    Sexually Abused: No    FAMILY HISTORY: Family History  Problem Relation Age of Onset   Diabetes Mellitus II Mother    Rheum arthritis Mother    CVA Mother    Diabetes Mellitus I Sister    Hashimoto's thyroiditis Sister    Heart attack Father    Colon cancer Neg Hx    Liver disease Neg Hx     ALLERGIES:  is allergic to betadine [povidone iodine], mango flavor, poison ivy extract, and sporanox [itraconazole].  MEDICATIONS:  Current Outpatient Medications  Medication Sig Dispense Refill   acetaminophen (TYLENOL) 325 MG tablet Take 2 tablets (650 mg total) by mouth every 6 (six) hours as needed for mild pain (or Fever >/= 101). 20 tablet 0   albuterol (VENTOLIN HFA) 108 (90 Base) MCG/ACT inhaler Inhale 2 puffs into the lungs every 6 (six) hours as needed for wheezing or shortness of breath. 6.7 g 2   aspirin EC (ASPIRIN LOW DOSE) 81 MG tablet Take 1 tablet (81 mg total) by mouth daily. Swallow whole. 90 tablet 3   atorvastatin (LIPITOR) 40 MG tablet Take 1 tablet (40 mg total) by mouth daily. 90 tablet 1   CALCIUM PO Take 1 tablet by mouth daily.     cyanocobalamin (,VITAMIN B-12,) 1000 MCG/ML injection Inject 1 mL (1,000 mcg total) into the skin every 21 ( twenty-one) days. 12 mL 3   pantoprazole (PROTONIX) 40 MG tablet Take 1 tablet (40 mg total) by mouth daily. 90 tablet 1    triamcinolone cream (KENALOG) 0.1 % Apply 1 Application topically 2 (two) times daily. 80 g 1   VITAMIN D PO Take 1 tablet by mouth daily.     No current facility-administered medications for this visit.    REVIEW OF SYSTEMS:   10 Point review of Systems was done is negative except as noted above.   PHYSICAL EXAMINATION: TELEMEDICINE VISIT  LABORATORY DATA:  I have reviewed the data as listed  .    Latest Ref Rng & Units 11/08/2022    7:43 AM 05/09/2022    9:55 AM 02/11/2022    3:48 PM  CBC  WBC 4.0 - 10.5 K/uL 5.8  7.5  7.5   Hemoglobin 12.0 - 15.0 g/dL 10.9  32.3  55.7   Hematocrit 36.0 -  46.0 % 44.1  48.3  48.3   Platelets 150 - 400 K/uL 145  184  156        Latest Ref Rng & Units 11/08/2022    7:43 AM 05/09/2022    9:55 AM 02/11/2022    3:48 PM  CMP  Glucose 70 - 99 mg/dL 99  92  132   BUN 8 - 23 mg/dL 20  23  22    Creatinine 0.44 - 1.00 mg/dL 4.40  1.02  7.25   Sodium 135 - 145 mmol/L 138  137  140   Potassium 3.5 - 5.1 mmol/L 4.3  3.9  3.7   Chloride 98 - 111 mmol/L 103  100  102   CO2 22 - 32 mmol/L 28  28  27    Calcium 8.9 - 10.3 mg/dL 9.0  36.6  9.6   Total Protein 6.5 - 8.1 g/dL 6.9  7.0  7.5   Total Bilirubin 0.3 - 1.2 mg/dL 0.6  0.4  0.5   Alkaline Phos 38 - 126 U/L 92  113    AST 15 - 41 U/L 13  16  15    ALT 0 - 44 U/L 10  11  14     Intrinsic Factor     0.0 - 1.1 AU/mL  18.2 (H)  Parietal Cell Antibody-IgG     0.0 - 20.0 Units  70.1 (H)   . Lab Results  Component Value Date   IRON 102 11/08/2022   TIBC 363 11/08/2022   IRONPCTSAT 28 11/08/2022   (Iron and TIBC)  Lab Results  Component Value Date   FERRITIN 18 11/08/2022   B12 --- 289  RADIOGRAPHIC STUDIES:  I have personally reviewed the radiological images as listed and agreed with the findings in the report. DG Cervical Spine Complete  Result Date: 10/19/2022 CLINICAL DATA:  Chronic neck, midthoracic and lower back pain worsening recently EXAM: CERVICAL SPINE - COMPLETE 4+ VIEW; LUMBAR  SPINE - COMPLETE 4+ VIEW; THORACIC SPINE 2 VIEWS COMPARISON:  CT chest 08/25/2022 and CT abdomen and pelvis 01/24/2018 FINDINGS: Cervical: No evidence of acute fracture or traumatic listhesis. Multilevel spondylosis, disc space height loss endplate changes greatest at C3-C4 where it is advanced. Mild-to-moderate multilevel facet arthropathy. Normal prevertebral soft tissues. Thoracic: Right convex thoracolumbar curve. No evidence of acute fracture or traumatic listhesis. Demineralization limits sensitivity for subtle fractures. Multilevel spondylosis. Lumbar: Multilevel advanced spondylosis, disc space height loss, degenerative endplate change and facet arthropathy. No evidence of acute fracture or traumatic listhesis noting limitations of demineralization and advanced degenerative change. IMPRESSION: Multilevel degenerative spondylosis and facet arthropathy greatest in the lumbar spine where it is advanced. Electronically Signed   By: Minerva Fester M.D.   On: 10/19/2022 23:12   DG Thoracic Spine 2 View  Result Date: 10/19/2022 CLINICAL DATA:  Chronic neck, midthoracic and lower back pain worsening recently EXAM: CERVICAL SPINE - COMPLETE 4+ VIEW; LUMBAR SPINE - COMPLETE 4+ VIEW; THORACIC SPINE 2 VIEWS COMPARISON:  CT chest 08/25/2022 and CT abdomen and pelvis 01/24/2018 FINDINGS: Cervical: No evidence of acute fracture or traumatic listhesis. Multilevel spondylosis, disc space height loss endplate changes greatest at C3-C4 where it is advanced. Mild-to-moderate multilevel facet arthropathy. Normal prevertebral soft tissues. Thoracic: Right convex thoracolumbar curve. No evidence of acute fracture or traumatic listhesis. Demineralization limits sensitivity for subtle fractures. Multilevel spondylosis. Lumbar: Multilevel advanced spondylosis, disc space height loss, degenerative endplate change and facet arthropathy. No evidence of acute fracture or traumatic listhesis noting limitations of demineralization  and  advanced degenerative change. IMPRESSION: Multilevel degenerative spondylosis and facet arthropathy greatest in the lumbar spine where it is advanced. Electronically Signed   By: Minerva Fester M.D.   On: 10/19/2022 23:12   DG Lumbar Spine Complete  Result Date: 10/19/2022 CLINICAL DATA:  Chronic neck, midthoracic and lower back pain worsening recently EXAM: CERVICAL SPINE - COMPLETE 4+ VIEW; LUMBAR SPINE - COMPLETE 4+ VIEW; THORACIC SPINE 2 VIEWS COMPARISON:  CT chest 08/25/2022 and CT abdomen and pelvis 01/24/2018 FINDINGS: Cervical: No evidence of acute fracture or traumatic listhesis. Multilevel spondylosis, disc space height loss endplate changes greatest at C3-C4 where it is advanced. Mild-to-moderate multilevel facet arthropathy. Normal prevertebral soft tissues. Thoracic: Right convex thoracolumbar curve. No evidence of acute fracture or traumatic listhesis. Demineralization limits sensitivity for subtle fractures. Multilevel spondylosis. Lumbar: Multilevel advanced spondylosis, disc space height loss, degenerative endplate change and facet arthropathy. No evidence of acute fracture or traumatic listhesis noting limitations of demineralization and advanced degenerative change. IMPRESSION: Multilevel degenerative spondylosis and facet arthropathy greatest in the lumbar spine where it is advanced. Electronically Signed   By: Minerva Fester M.D.   On: 10/19/2022 23:12    ASSESSMENT & PLAN:   72 y.o. female with  1. S/p Pancytopenia likely due to severe B12 deficiency- now resolved. Patient's labs upon initial presentation 01/23/18, Vitamin B12 low at <50. S/p blood transfusion the pt's HGB increased from 4.9 to 8.6.   Anemia a combination of pernicious anemia causing B12 and iron deficiency, and a concern of GI bleeding  2. Severe B12 deficiency due to pernicious anemia +ve parietal cell and Intrinsic factor antibodies. 01/24/18 anti-parietal antibody and intrinsic factor antibody are both  elevated consistent with pernicious anemia.   3. H/o DVT rt posterior tibial veins. Likely triggering event - 2 packs/day of cigarette smoking.  01/23/18 VAS US revealed  Right: findings consistent with acute DVT involving the right posterior tibial vein  S/p 6 months of Xarelto Plan -conitnue ASA 81mg  po daily  4. Iron deficiency  PLAN:  -Discussed lab results from 11/08/2022 in detail with patient. CBC normal overall, showed WBC of 5.8K, hemoglobin decreased to 14.5, and platelets of 145K. -WBC and platelets normal -CMP normal -Ferritin gradually decreased to 18. Ferritin was previously 117,  9 months ago and 32, 6 months ago. This suggests an absorption issue due to gradual decline in last year.  -discussed option to optimize ferritin levels with IV iron or to continue to monitor. Discussed that trend may suggest anemia down the line.  -patient would like to proceed with IV iron at this time -would recommend 1-2 iron infusions every year to keep ferritin>50 closer to 100, -will refill vitamin B12 injection -vitamin B12 levels steady in 300s, continue with current dose -will schedule IV Injectafer 750 mg x 1 dose within the next 1 to 2 weeks -continue to eat iron rich foods.  -will continue to monitor with labs every 6 months -continue to follow-up with PCP regarding cancer screening.   FOLLOW-UP: IV Injectafer 750 mg x 1 dose within the next 1 to 2 weeks Return to clinic with Dr. Candise Che with labs in 6 months  The total time spent in the appointment was 20 minutes* .  All of the patient's questions were answered with apparent satisfaction. The patient knows to call the clinic with any problems, questions or concerns.   Wyvonnia Lora MD MS AAHIVMS [facility] Hematology/Oncology Physician [facility]  .*Total Encounter Time as defined by  the Centers for Medicare and Medicaid Services includes, in addition to the face-to-face time of a patient visit (documented in the  note above) non-face-to-face time: obtaining and reviewing outside history, ordering and reviewing medications, tests or procedures, care coordination (communications with other health care professionals or caregivers) and documentation in the medical record.    I,Mitra Faeizi,acting as a Neurosurgeon for Wyvonnia Lora, MD.,have documented all relevant documentation on the behalf of Wyvonnia Lora, MD,as directed by  Wyvonnia Lora, MD while in the presence of Wyvonnia Lora, MD.  .I have reviewed the above documentation for accuracy and completeness, and I agree with the above. Johney Maine MD

## 2022-11-11 NOTE — Progress Notes (Signed)
She was reconsidering possible bone builders at recent visit, and this was to check where she at to revisit that discussion.  I think she should have appointment to go over this with her Primary Care Provider (PCP).

## 2022-11-14 ENCOUNTER — Telehealth: Payer: Self-pay | Admitting: *Deleted

## 2022-11-14 ENCOUNTER — Other Ambulatory Visit (HOSPITAL_COMMUNITY): Payer: Self-pay

## 2022-11-14 MED ORDER — VITAMIN D 25 MCG (1000 UNIT) PO TABS: 1000.0000 [IU] | ORAL_TABLET | Freq: Every day | ORAL | Status: DC

## 2022-11-14 MED ORDER — CALTRATE 600+D PLUS MINERALS 600-800 MG-UNIT PO TABS: 1.0000 | ORAL_TABLET | Freq: Two times a day (BID) | ORAL | Status: AC

## 2022-11-14 NOTE — Telephone Encounter (Signed)
Pharmacy Patient Advocate Encounter  Received notification from  [employer]   that Prior Authorization for Evenity 105MG /1.17ML syringes has been APPROVED from 04/04/2022 to 11/09/2023 . Ran test claim, Copay is $1252.18. This test claim was processed through [facility]- copay amounts may vary at other pharmacies due to pharmacy/plan contracts, or as the patient moves through the different stages of their insurance plan.   PA #/Case ID/Reference #: [id number]

## 2022-11-14 NOTE — Telephone Encounter (Signed)
Spoke to pt told her Lelon Mast would like her to take OTC Caltrate 600 mg + Vit D 3 twice a day. Pt said she is already taking that, Calcium 600 mg, Vit D 3 400 international units. Asked her if taking twice a day? Pt said yes. Told her the medications are not correct on your med list I will put correct medication on list. Pt verbalized understanding. Told pt continue medication and can discuss at next follow up if need to add anything else. Pt verbalized understanding.  Samantha notified pt already taking and chart updated.

## 2022-11-14 NOTE — Telephone Encounter (Signed)
Jodi Myers, pt does not want to start Evenity injections due to cost. Pt said she will do a supplement if you want her to. Please advise.

## 2022-11-14 NOTE — Telephone Encounter (Signed)
-----   Message from Jarold Motto sent at 11/14/2022  8:07 AM EDT ----- This order was initially started by Dr Jon Billings.  When I discussed with patient, she does not want to do these injections.  Please encourage her to schedule a visit with me to discuss medications and/or these injections if she is interested in starting anything. ----- Message ----- From: Jimmye Norman, LPN Sent: 04/09/1094   1:04 PM EDT To: Jarold Motto, PA  Received fax from insurance Evenity injections have been approved.  Corky Mull, LPN ----- Message ----- From: Macy Mis Sent: 11/10/2022   9:53 AM EDT To: [place]  Please review

## 2022-11-14 NOTE — Addendum Note (Signed)
Addended by: Jimmye Norman on: 11/14/2022 03:47 PM   Modules accepted: Orders

## 2022-11-14 NOTE — Telephone Encounter (Signed)
Spoke to pt told her calling to let her know that Sheilah Mins was approved by insurance, the medication Dr. Jon Billings wanted you on for Osteoporosis. Told her Lelon Mast said that she discussed with you at last visit and you did not want to stat injections. Asked pt do you want to do them? Pt said she did not know they were approved. Told her yes, but I do not know your cost. Did you not receive a letter from insurance? Pt said no. I looked though chart and found   Evenity 105MG /1.17ML syringes has been APPROVED from 04/04/2022 to 11/09/2023 . Ran test claim, Copay is $1252.18.  Pt said no way to expensive. Told her Lelon Mast said she wanted you to schedule an appt to discuss. Pt said they did discuss at last visit and if she wants me to start a supplement she will just let her know. Told her I will let Lelon Mast know and get back to you. Pt verbalized understanding.

## 2022-11-15 ENCOUNTER — Ambulatory Visit: Payer: Medicare Other | Admitting: Physician Assistant

## 2022-11-17 ENCOUNTER — Ambulatory Visit: Payer: Medicare Other | Admitting: Podiatry

## 2022-11-17 ENCOUNTER — Telehealth: Payer: Self-pay | Admitting: Hematology

## 2022-11-17 NOTE — Telephone Encounter (Signed)
Scheduled per 08/07 los, called and left a voicemail.

## 2022-11-25 ENCOUNTER — Ambulatory Visit (INDEPENDENT_AMBULATORY_CARE_PROVIDER_SITE_OTHER): Payer: Medicare Other | Admitting: Podiatry

## 2022-11-25 ENCOUNTER — Other Ambulatory Visit: Payer: Self-pay

## 2022-11-25 ENCOUNTER — Inpatient Hospital Stay: Payer: Medicare Other

## 2022-11-25 VITALS — BP 105/58 | HR 83 | Temp 97.7°F | Resp 20

## 2022-11-25 DIAGNOSIS — D510 Vitamin B12 deficiency anemia due to intrinsic factor deficiency: Secondary | ICD-10-CM | POA: Diagnosis not present

## 2022-11-25 DIAGNOSIS — D509 Iron deficiency anemia, unspecified: Secondary | ICD-10-CM

## 2022-11-25 DIAGNOSIS — Z7982 Long term (current) use of aspirin: Secondary | ICD-10-CM | POA: Diagnosis not present

## 2022-11-25 DIAGNOSIS — L600 Ingrowing nail: Secondary | ICD-10-CM

## 2022-11-25 DIAGNOSIS — Z86718 Personal history of other venous thrombosis and embolism: Secondary | ICD-10-CM | POA: Diagnosis not present

## 2022-11-25 DIAGNOSIS — F17210 Nicotine dependence, cigarettes, uncomplicated: Secondary | ICD-10-CM | POA: Diagnosis not present

## 2022-11-25 MED ORDER — LORATADINE 10 MG PO TABS
10.0000 mg | ORAL_TABLET | Freq: Once | ORAL | Status: AC
  Administered 2022-11-25: 10 mg via ORAL
  Filled 2022-11-25: qty 1

## 2022-11-25 MED ORDER — SODIUM CHLORIDE 0.9 % IV SOLN
750.0000 mg | Freq: Once | INTRAVENOUS | Status: AC
  Administered 2022-11-25: 750 mg via INTRAVENOUS
  Filled 2022-11-25: qty 15

## 2022-11-25 MED ORDER — SODIUM CHLORIDE 0.9 % IV SOLN: INTRAVENOUS | Status: DC

## 2022-11-25 MED ORDER — ACETAMINOPHEN 325 MG PO TABS
650.0000 mg | ORAL_TABLET | Freq: Once | ORAL | Status: AC
  Administered 2022-11-25: 650 mg via ORAL
  Filled 2022-11-25: qty 2

## 2022-11-25 NOTE — Progress Notes (Signed)
Pt declined 30 min post iron obsevation. VSS at discharge

## 2022-11-25 NOTE — Progress Notes (Addendum)
Subjective: Chief Complaint  Patient presents with   Follow-up    Patient had a right nail avulsion and patient believes her nail is growing again. No sign of infection and denies any pain at this time.     She previously underwent right total nail avulsion in December 2023.  She wants to make sure that the nails not growing back.  She does not report any pain, drainage, swelling or any signs of infection.  Objective: AAO x3, NAD DP/PT pulses palpable bilaterally, CRT less than 3 seconds On the right hallux toenail the nail has been removed.  There is a small Mehta skin buildup in the corners there is no new toenail identified at this time.  There is no edema, erythema, drainage or pus or any signs of infection.  No open lesion.  Procedure site is well-healed. No pain with calf compression, swelling, warmth, erythema  Assessment: Status post right total nail avulsion, healing well . Plan: -All treatment options discussed with the patient including all alternatives, risks, complications.  -At this time I did not appreciate any new nail growth.  I did curette the corners to make sure there is no growth. Discussed this could occur in the future, monitor.  Discussed moisturizer to help prevent callus, dry skin along the nailbed.  Monitor for any signs or symptoms of infection or reoccurrence. -Patient encouraged to call the office with any questions, concerns, change in symptoms.   Vivi Barrack DPM]

## 2022-12-02 ENCOUNTER — Other Ambulatory Visit (HOSPITAL_BASED_OUTPATIENT_CLINIC_OR_DEPARTMENT_OTHER): Payer: Self-pay

## 2022-12-02 MED FILL — Pantoprazole Sodium EC Tab 40 MG (Base Equiv): ORAL | 90 days supply | Qty: 90 | Fill #0 | Status: AC

## 2022-12-13 ENCOUNTER — Encounter (INDEPENDENT_AMBULATORY_CARE_PROVIDER_SITE_OTHER): Payer: Medicare Other | Admitting: Ophthalmology

## 2022-12-13 DIAGNOSIS — H318 Other specified disorders of choroid: Secondary | ICD-10-CM | POA: Diagnosis not present

## 2022-12-13 DIAGNOSIS — H32 Chorioretinal disorders in diseases classified elsewhere: Secondary | ICD-10-CM | POA: Diagnosis not present

## 2022-12-13 DIAGNOSIS — H43813 Vitreous degeneration, bilateral: Secondary | ICD-10-CM | POA: Diagnosis not present

## 2022-12-13 DIAGNOSIS — B399 Histoplasmosis, unspecified: Secondary | ICD-10-CM

## 2023-01-18 ENCOUNTER — Other Ambulatory Visit (HOSPITAL_BASED_OUTPATIENT_CLINIC_OR_DEPARTMENT_OTHER): Payer: Self-pay

## 2023-01-18 ENCOUNTER — Encounter: Payer: Self-pay | Admitting: Hematology

## 2023-01-18 DIAGNOSIS — Z23 Encounter for immunization: Secondary | ICD-10-CM | POA: Diagnosis not present

## 2023-01-18 MED ORDER — INFLUENZA VAC A&B SURF ANT ADJ 0.5 ML IM SUSY
0.5000 mL | PREFILLED_SYRINGE | Freq: Once | INTRAMUSCULAR | 0 refills | Status: AC
  Filled 2023-01-18: qty 0.5, 1d supply, fill #0

## 2023-01-18 MED ORDER — COVID-19 MRNA VAC-TRIS(PFIZER) 30 MCG/0.3ML IM SUSY
0.3000 mL | PREFILLED_SYRINGE | Freq: Once | INTRAMUSCULAR | 0 refills | Status: AC
  Filled 2023-01-18: qty 0.3, 1d supply, fill #0

## 2023-01-24 ENCOUNTER — Other Ambulatory Visit (HOSPITAL_BASED_OUTPATIENT_CLINIC_OR_DEPARTMENT_OTHER): Payer: Self-pay

## 2023-02-06 DIAGNOSIS — Z1231 Encounter for screening mammogram for malignant neoplasm of breast: Secondary | ICD-10-CM | POA: Diagnosis not present

## 2023-02-07 ENCOUNTER — Encounter (INDEPENDENT_AMBULATORY_CARE_PROVIDER_SITE_OTHER): Payer: Medicare Other | Admitting: Ophthalmology

## 2023-02-07 DIAGNOSIS — H318 Other specified disorders of choroid: Secondary | ICD-10-CM

## 2023-02-07 DIAGNOSIS — B399 Histoplasmosis, unspecified: Secondary | ICD-10-CM | POA: Diagnosis not present

## 2023-02-07 DIAGNOSIS — H32 Chorioretinal disorders in diseases classified elsewhere: Secondary | ICD-10-CM

## 2023-02-07 DIAGNOSIS — H43813 Vitreous degeneration, bilateral: Secondary | ICD-10-CM | POA: Diagnosis not present

## 2023-02-08 ENCOUNTER — Encounter: Payer: Self-pay | Admitting: Physician Assistant

## 2023-03-01 ENCOUNTER — Other Ambulatory Visit: Payer: Self-pay

## 2023-03-01 ENCOUNTER — Encounter: Payer: Self-pay | Admitting: Hematology

## 2023-03-01 ENCOUNTER — Other Ambulatory Visit (HOSPITAL_BASED_OUTPATIENT_CLINIC_OR_DEPARTMENT_OTHER): Payer: Self-pay

## 2023-03-06 ENCOUNTER — Other Ambulatory Visit (HOSPITAL_BASED_OUTPATIENT_CLINIC_OR_DEPARTMENT_OTHER): Payer: Self-pay

## 2023-04-11 ENCOUNTER — Encounter (INDEPENDENT_AMBULATORY_CARE_PROVIDER_SITE_OTHER): Payer: Medicare Other | Admitting: Ophthalmology

## 2023-04-11 DIAGNOSIS — H32 Chorioretinal disorders in diseases classified elsewhere: Secondary | ICD-10-CM

## 2023-04-11 DIAGNOSIS — H318 Other specified disorders of choroid: Secondary | ICD-10-CM | POA: Diagnosis not present

## 2023-04-11 DIAGNOSIS — B399 Histoplasmosis, unspecified: Secondary | ICD-10-CM | POA: Diagnosis not present

## 2023-04-11 DIAGNOSIS — H43813 Vitreous degeneration, bilateral: Secondary | ICD-10-CM

## 2023-04-14 ENCOUNTER — Other Ambulatory Visit: Payer: Self-pay | Admitting: *Deleted

## 2023-04-14 DIAGNOSIS — Z87891 Personal history of nicotine dependence: Secondary | ICD-10-CM

## 2023-04-14 DIAGNOSIS — Z122 Encounter for screening for malignant neoplasm of respiratory organs: Secondary | ICD-10-CM

## 2023-04-27 ENCOUNTER — Other Ambulatory Visit (HOSPITAL_BASED_OUTPATIENT_CLINIC_OR_DEPARTMENT_OTHER): Payer: Self-pay

## 2023-04-27 ENCOUNTER — Encounter: Payer: Self-pay | Admitting: Hematology

## 2023-04-27 MED ORDER — METFORMIN HCL ER 500 MG PO TB24
500.0000 mg | ORAL_TABLET | Freq: Every day | ORAL | 1 refills | Status: DC
  Filled 2023-04-27: qty 90, 90d supply, fill #0

## 2023-04-27 MED ORDER — RYBELSUS 3 MG PO TABS
3.0000 mg | ORAL_TABLET | Freq: Every day | ORAL | 0 refills | Status: DC
  Filled 2023-04-27: qty 30, 30d supply, fill #0

## 2023-05-10 ENCOUNTER — Other Ambulatory Visit: Payer: Medicare Other

## 2023-05-10 ENCOUNTER — Ambulatory Visit: Payer: Medicare Other | Admitting: Hematology

## 2023-05-29 ENCOUNTER — Other Ambulatory Visit: Payer: Self-pay

## 2023-05-29 DIAGNOSIS — D510 Vitamin B12 deficiency anemia due to intrinsic factor deficiency: Secondary | ICD-10-CM

## 2023-05-29 DIAGNOSIS — D509 Iron deficiency anemia, unspecified: Secondary | ICD-10-CM

## 2023-05-30 ENCOUNTER — Inpatient Hospital Stay: Payer: Medicare Other | Attending: Hematology

## 2023-05-30 ENCOUNTER — Inpatient Hospital Stay (HOSPITAL_BASED_OUTPATIENT_CLINIC_OR_DEPARTMENT_OTHER): Payer: Medicare Other | Admitting: Hematology

## 2023-05-30 VITALS — BP 121/55 | HR 93 | Temp 98.1°F | Resp 17 | Wt 186.2 lb

## 2023-05-30 DIAGNOSIS — E611 Iron deficiency: Secondary | ICD-10-CM | POA: Diagnosis not present

## 2023-05-30 DIAGNOSIS — D510 Vitamin B12 deficiency anemia due to intrinsic factor deficiency: Secondary | ICD-10-CM | POA: Diagnosis not present

## 2023-05-30 DIAGNOSIS — Z7982 Long term (current) use of aspirin: Secondary | ICD-10-CM | POA: Diagnosis not present

## 2023-05-30 DIAGNOSIS — Z86718 Personal history of other venous thrombosis and embolism: Secondary | ICD-10-CM | POA: Diagnosis not present

## 2023-05-30 DIAGNOSIS — D509 Iron deficiency anemia, unspecified: Secondary | ICD-10-CM

## 2023-05-30 DIAGNOSIS — F17210 Nicotine dependence, cigarettes, uncomplicated: Secondary | ICD-10-CM | POA: Insufficient documentation

## 2023-05-30 LAB — CBC WITH DIFFERENTIAL (CANCER CENTER ONLY)
Abs Immature Granulocytes: 0.01 10*3/uL (ref 0.00–0.07)
Basophils Absolute: 0.1 10*3/uL (ref 0.0–0.1)
Basophils Relative: 1 %
Eosinophils Absolute: 0.2 10*3/uL (ref 0.0–0.5)
Eosinophils Relative: 3 %
HCT: 47.7 % — ABNORMAL HIGH (ref 36.0–46.0)
Hemoglobin: 15.7 g/dL — ABNORMAL HIGH (ref 12.0–15.0)
Immature Granulocytes: 0 %
Lymphocytes Relative: 20 %
Lymphs Abs: 1.4 10*3/uL (ref 0.7–4.0)
MCH: 31.3 pg (ref 26.0–34.0)
MCHC: 32.9 g/dL (ref 30.0–36.0)
MCV: 95.2 fL (ref 80.0–100.0)
Monocytes Absolute: 0.6 10*3/uL (ref 0.1–1.0)
Monocytes Relative: 8 %
Neutro Abs: 4.6 10*3/uL (ref 1.7–7.7)
Neutrophils Relative %: 68 %
Platelet Count: 185 10*3/uL (ref 150–400)
RBC: 5.01 MIL/uL (ref 3.87–5.11)
RDW: 13.1 % (ref 11.5–15.5)
WBC Count: 6.8 10*3/uL (ref 4.0–10.5)
nRBC: 0 % (ref 0.0–0.2)

## 2023-05-30 LAB — CMP (CANCER CENTER ONLY)
ALT: 12 U/L (ref 0–44)
AST: 14 U/L — ABNORMAL LOW (ref 15–41)
Albumin: 4.2 g/dL (ref 3.5–5.0)
Alkaline Phosphatase: 102 U/L (ref 38–126)
Anion gap: 7 (ref 5–15)
BUN: 23 mg/dL (ref 8–23)
CO2: 31 mmol/L (ref 22–32)
Calcium: 9.7 mg/dL (ref 8.9–10.3)
Chloride: 102 mmol/L (ref 98–111)
Creatinine: 1.02 mg/dL — ABNORMAL HIGH (ref 0.44–1.00)
GFR, Estimated: 58 mL/min — ABNORMAL LOW (ref >60)
Glucose, Bld: 100 mg/dL — ABNORMAL HIGH (ref 70–99)
Potassium: 4 mmol/L (ref 3.5–5.1)
Sodium: 140 mmol/L (ref 135–145)
Total Bilirubin: 0.5 mg/dL (ref 0.0–1.2)
Total Protein: 7.7 g/dL (ref 6.5–8.1)

## 2023-05-30 LAB — FERRITIN: Ferritin: 38 ng/mL (ref 11–307)

## 2023-05-30 LAB — IRON AND IRON BINDING CAPACITY (CC-WL,HP ONLY)
Iron: 103 ug/dL (ref 28–170)
Saturation Ratios: 26 % (ref 10.4–31.8)
TIBC: 396 ug/dL (ref 250–450)
UIBC: 293 ug/dL (ref 148–442)

## 2023-05-30 LAB — VITAMIN B12: Vitamin B-12: 301 pg/mL (ref 180–914)

## 2023-05-30 NOTE — Progress Notes (Signed)
 HEMATOLOGY/ONCOLOGY CLINIC VISIT NOTE  Date of Service: 05/30/23    Patient Care Team: Jarold Motto, [place] as PCP - General (Physician Assistant) Sherrie George, MD as Consulting Physician (Ophthalmology) Johney Maine, MD as Consulting Physician (Hematology)  CHIEF COMPLAINTS/PURPOSE OF CONSULTATION:  Follow-up for pernicious anemia causing B12 and iron deficiency  HISTORY OF PRESENTING ILLNESS:  Please see previous note for details on initial presentation.  Interval History:  Jodi Myers is here for her scheduled follow-up for continued evaluation and management of pernicious anemia.  I last connected with the patient, tele-med visit, on 11/10/2022 and she was doing well overall.   Patient notes she has been doing well overall since our last visit. She denies any new infection issues, fever, chills, night sweats, unexpected weight loss, back pain, chest pain, abdominal pain, or leg swelling.  Patient notes she continues to smoke 1 1/2 packs of cigarettes a day.   She continues to receive Vitamin B-12 injection every 3 weeks.   MEDICAL HISTORY:  Past Medical History:  Diagnosis Date   Anemia 07/2018   bloood transfusion administered    Coronary artery disease     SURGICAL HISTORY: Past Surgical History:  Procedure Laterality Date   TONSILLECTOMY      SOCIAL HISTORY: Social History   Socioeconomic History   Marital status: Married    Spouse name: Not on file   Number of children: Not on file   Years of education: Not on file   Highest education level: Some college, no degree  Occupational History   Occupation: retired  Tobacco Use   Smoking status: Every Day    Current packs/day: 1.50    Average packs/day: 1.5 packs/day for 49.0 years (73.5 ttl pk-yrs)    Types: Cigarettes   Smokeless tobacco: Never  Vaping Use   Vaping status: Never Used  Substance and Sexual Activity   Alcohol use: Yes    Alcohol/week: 7.0 standard drinks of alcohol     Types: 7 Cans of beer per week    Comment: a beer a day   Drug use: Never   Sexual activity: Not on file  Other Topics Concern   Not on file  Social History Narrative   Not on file   Social Drivers of Health   Financial Resource Strain: Low Risk  (10/24/2022)   Overall Financial Resource Strain (CARDIA)    Difficulty of Paying Living Expenses: Not hard at all  Food Insecurity: No Food Insecurity (10/24/2022)   Hunger Vital Sign    Worried About Running Out of Food in the Last Year: Never true    Ran Out of Food in the Last Year: Never true  Transportation Needs: No Transportation Needs (10/24/2022)   PRAPARE - Administrator, Civil Service (Medical): No    Lack of Transportation (Non-Medical): No  Physical Activity: Insufficiently Active (10/24/2022)   Exercise Vital Sign    Days of Exercise per Week: 3 days    Minutes of Exercise per Session: 20 min  Stress: No Stress Concern Present (10/24/2022)   [name] - Occupational Stress Questionnaire    Feeling of Stress : Not at all  Social Connections: Moderately Isolated (10/24/2022)   Social Connection and Isolation Panel [NHANES]    Frequency of Communication with Friends and Family: More than three times a week    Frequency of Social Gatherings with Friends and Family: Three times a week    Attends Religious Services: Never  Active Member of Clubs or Organizations: No    Attends Banker Meetings: Not on file    Marital Status: Married  Intimate Partner Violence: Not At Risk (05/03/2022)   Humiliation, Afraid, Rape, and Kick questionnaire    Fear of Current or Ex-Partner: No    Emotionally Abused: No    Physically Abused: No    Sexually Abused: No    FAMILY HISTORY: Family History  Problem Relation Age of Onset   Diabetes Mellitus II Mother    Rheum arthritis Mother    CVA Mother    Diabetes Mellitus I Sister    Hashimoto's thyroiditis Sister    Heart attack  Father    Colon cancer Neg Hx    Liver disease Neg Hx     ALLERGIES:  is allergic to betadine [povidone iodine], mango flavoring agent (non-screening), poison ivy extract, and sporanox [itraconazole].  MEDICATIONS:  Current Outpatient Medications  Medication Sig Dispense Refill   acetaminophen (TYLENOL) 325 MG tablet Take 2 tablets (650 mg total) by mouth every 6 (six) hours as needed for mild pain (or Fever >/= 101). 20 tablet 0   albuterol (VENTOLIN HFA) 108 (90 Base) MCG/ACT inhaler Inhale 2 puffs into the lungs every 6 (six) hours as needed for wheezing or shortness of breath. 6.7 g 2   aspirin EC (ASPIRIN LOW DOSE) 81 MG tablet Take 1 tablet (81 mg total) by mouth daily. Swallow whole. 90 tablet 3   atorvastatin (LIPITOR) 40 MG tablet Take 1 tablet (40 mg total) by mouth daily. 90 tablet 1   Calcium Carbonate-Vit D-Min (CALTRATE 600+D PLUS MINERALS) 600-800 MG-UNIT TABS Take 1 tablet by mouth 2 (two) times daily.     cholecalciferol (VITAMIN D3) 25 MCG (1000 UNIT) tablet Take 1 tablet (1,000 Units total) by mouth daily.     cyanocobalamin (VITAMIN B12) 1000 MCG/ML injection Inject 1 mL (1,000 mcg total) into the skin every 21 (twenty-one) days. 12 mL 3   pantoprazole (PROTONIX) 40 MG tablet Take 1 tablet (40 mg total) by mouth daily. 90 tablet 1   triamcinolone cream (KENALOG) 0.1 % Apply 1 Application topically 2 (two) times daily. 80 g 1   No current facility-administered medications for this visit.    REVIEW OF SYSTEMS:   10 Point review of Systems was done is negative except as noted above.   PHYSICAL EXAMINATION:  .BP (!) 121/55 (BP Location: Right Arm, Patient Position: Sitting) Comment: Nurse notified  Pulse 93   Temp 98.1 F (36.7 C) (Temporal)   Resp 17   Wt 186 lb 3.2 oz (84.5 kg)   SpO2 93%   BMI 30.05 kg/m   GENERAL:alert, in no acute distress and comfortable SKIN: no acute rashes, no significant lesions EYES: conjunctiva are pink and non-injected, sclera  anicteric OROPHARYNX: MMM, no exudates, no oropharyngeal erythema or ulceration NECK: supple, no JVD LYMPH:  no palpable lymphadenopathy in the cervical, axillary or inguinal regions LUNGS: clear to auscultation b/l with normal respiratory effort HEART: regular rate & rhythm ABDOMEN:  normoactive bowel sounds , non tender, not distended. Extremity: no pedal edema PSYCH: alert & oriented x 3 with fluent speech NEURO: no focal motor/sensory deficits  LABORATORY DATA:  I have reviewed the data as listed  .    Latest Ref Rng & Units 05/30/2023   11:11 AM 11/08/2022    7:43 AM 05/09/2022    9:55 AM  CBC  WBC 4.0 - 10.5 K/uL 6.8  5.8  7.5  Hemoglobin 12.0 - 15.0 g/dL 16.1  09.6  04.5   Hematocrit 36.0 - 46.0 % 47.7  44.1  48.3   Platelets 150 - 400 K/uL 185  145  184        Latest Ref Rng & Units 05/30/2023   11:11 AM 11/08/2022    7:43 AM 05/09/2022    9:55 AM  CMP  Glucose 70 - 99 mg/dL 409  99  92   BUN 8 - 23 mg/dL 23  20  23    Creatinine 0.44 - 1.00 mg/dL 8.11  9.14  7.82   Sodium 135 - 145 mmol/L 140  138  137   Potassium 3.5 - 5.1 mmol/L 4.0  4.3  3.9   Chloride 98 - 111 mmol/L 102  103  100   CO2 22 - 32 mmol/L 31  28  28    Calcium 8.9 - 10.3 mg/dL 9.7  9.0  95.6   Total Protein 6.5 - 8.1 g/dL 7.7  6.9  7.0   Total Bilirubin 0.0 - 1.2 mg/dL 0.5  0.6  0.4   Alkaline Phos 38 - 126 U/L 102  92  113   AST 15 - 41 U/L 14  13  16    ALT 0 - 44 U/L 12  10  11     Intrinsic Factor     0.0 - 1.1 AU/mL  18.2 (H)  Parietal Cell Antibody-IgG     0.0 - 20.0 Units  70.1 (H)   . Lab Results  Component Value Date   IRON 102 11/08/2022   TIBC 363 11/08/2022   IRONPCTSAT 28 11/08/2022   (Iron and TIBC)  Lab Results  Component Value Date   FERRITIN 18 11/08/2022   B12 --- 289  RADIOGRAPHIC STUDIES:  I have personally reviewed the radiological images as listed and agreed with the findings in the report. No results found.  ASSESSMENT & PLAN:   73 y.o. female with  1. S/p  Pancytopenia likely due to severe B12 deficiency- now resolved. Patient's labs upon initial presentation 01/23/18, Vitamin B12 low at <50. S/p blood transfusion the pt's HGB increased from 4.9 to 8.6.   Anemia a combination of pernicious anemia causing B12 and iron deficiency, and a concern of GI bleeding  2. Severe B12 deficiency due to pernicious anemia +ve parietal cell and Intrinsic factor antibodies. 01/24/18 anti-parietal antibody and intrinsic factor antibody are both elevated consistent with pernicious anemia.   3. H/o DVT rt posterior tibial veins. Likely triggering event - 2 packs/day of cigarette smoking.  01/23/18 VAS US revealed  Right: findings consistent with acute DVT involving the right posterior tibial vein  S/p 6 months of Xarelto Plan -conitnue ASA 81mg  po daily  4. Iron deficiency  PLAN:  -Discussed lab results from today, 05/30/2023, in detail with the patient. CBC shows elevated Hemoglobin of 15.7 g/dL with elevated hematocrit of 47.7%. CMP pending. Iron labs pending.  -WBC and platelets normal -CMP normal -continue to eat iron rich foods.  -continue to follow-up with PCP regarding cancer screening.  -RTC with labs in 1 year.  -RTC with PCP in 16-months with lab: CBC, CMP, iron panel, Vitamin B-12 lab.  -Answered all of patient's questions.   FOLLOW-UP: Return to clinic with Dr. Candise Che with labs in 12 months  The total time spent in the appointment was 20 minutes* .  All of the patient's questions were answered with apparent satisfaction. The patient knows to call the clinic with any problems, questions or  concerns.   Wyvonnia Lora MD MS AAHIVMS [facility] Hematology/Oncology Physician [facility]  .*Total Encounter Time as defined by the Centers for Medicare and Medicaid Services includes, in addition to the face-to-face time of a patient visit (documented in the note above) non-face-to-face time: obtaining and reviewing outside history,  ordering and reviewing medications, tests or procedures, care coordination (communications with other health care professionals or caregivers) and documentation in the medical record.   I,Param Shah,acting as a Neurosurgeon for Wyvonnia Lora, MD.,have documented all relevant documentation on the behalf of Wyvonnia Lora, MD,as directed by  Wyvonnia Lora, MD while in the presence of Wyvonnia Lora, MD.  .I have reviewed the above documentation for accuracy and completeness, and I agree with the above. Johney Maine MD

## 2023-05-31 ENCOUNTER — Other Ambulatory Visit (HOSPITAL_BASED_OUTPATIENT_CLINIC_OR_DEPARTMENT_OTHER): Payer: Self-pay

## 2023-05-31 ENCOUNTER — Other Ambulatory Visit: Payer: Self-pay | Admitting: Physician Assistant

## 2023-05-31 MED ORDER — ATORVASTATIN CALCIUM 40 MG PO TABS
40.0000 mg | ORAL_TABLET | Freq: Every day | ORAL | 1 refills | Status: DC
  Filled 2023-05-31: qty 90, 90d supply, fill #0
  Filled 2023-08-28: qty 90, 90d supply, fill #1

## 2023-06-02 ENCOUNTER — Other Ambulatory Visit (HOSPITAL_BASED_OUTPATIENT_CLINIC_OR_DEPARTMENT_OTHER): Payer: Self-pay

## 2023-06-02 MED FILL — Pantoprazole Sodium EC Tab 40 MG (Base Equiv): ORAL | 90 days supply | Qty: 90 | Fill #1 | Status: AC

## 2023-06-05 ENCOUNTER — Encounter: Payer: Self-pay | Admitting: Hematology

## 2023-06-13 ENCOUNTER — Encounter (INDEPENDENT_AMBULATORY_CARE_PROVIDER_SITE_OTHER): Payer: Medicare Other | Admitting: Ophthalmology

## 2023-06-13 DIAGNOSIS — H43813 Vitreous degeneration, bilateral: Secondary | ICD-10-CM | POA: Diagnosis not present

## 2023-06-13 DIAGNOSIS — B399 Histoplasmosis, unspecified: Secondary | ICD-10-CM | POA: Diagnosis not present

## 2023-06-13 DIAGNOSIS — H32 Chorioretinal disorders in diseases classified elsewhere: Secondary | ICD-10-CM

## 2023-06-13 DIAGNOSIS — H318 Other specified disorders of choroid: Secondary | ICD-10-CM

## 2023-08-15 ENCOUNTER — Encounter (INDEPENDENT_AMBULATORY_CARE_PROVIDER_SITE_OTHER): Admitting: Ophthalmology

## 2023-08-15 DIAGNOSIS — H43813 Vitreous degeneration, bilateral: Secondary | ICD-10-CM | POA: Diagnosis not present

## 2023-08-15 DIAGNOSIS — B399 Histoplasmosis, unspecified: Secondary | ICD-10-CM | POA: Diagnosis not present

## 2023-08-15 DIAGNOSIS — H318 Other specified disorders of choroid: Secondary | ICD-10-CM | POA: Diagnosis not present

## 2023-08-24 ENCOUNTER — Other Ambulatory Visit: Payer: Self-pay | Admitting: Physician Assistant

## 2023-08-25 ENCOUNTER — Encounter: Payer: Self-pay | Admitting: Hematology

## 2023-08-25 ENCOUNTER — Other Ambulatory Visit (HOSPITAL_BASED_OUTPATIENT_CLINIC_OR_DEPARTMENT_OTHER): Payer: Self-pay

## 2023-08-25 ENCOUNTER — Other Ambulatory Visit: Payer: Self-pay

## 2023-08-25 MED ORDER — PANTOPRAZOLE SODIUM 40 MG PO TBEC
40.0000 mg | DELAYED_RELEASE_TABLET | Freq: Every day | ORAL | 0 refills | Status: DC
  Filled 2023-08-25: qty 90, 90d supply, fill #0

## 2023-08-29 ENCOUNTER — Other Ambulatory Visit: Payer: Self-pay

## 2023-08-29 ENCOUNTER — Other Ambulatory Visit (HOSPITAL_BASED_OUTPATIENT_CLINIC_OR_DEPARTMENT_OTHER): Payer: Self-pay

## 2023-08-29 ENCOUNTER — Encounter: Payer: Self-pay | Admitting: Hematology

## 2023-09-22 ENCOUNTER — Ambulatory Visit
Admission: RE | Admit: 2023-09-22 | Discharge: 2023-09-22 | Disposition: A | Discharge status: Home / Self Care | Source: Ambulatory Visit | Attending: Acute Care | Admitting: Acute Care

## 2023-09-22 DIAGNOSIS — Z122 Encounter for screening for malignant neoplasm of respiratory organs: Secondary | ICD-10-CM | POA: Diagnosis not present

## 2023-09-22 DIAGNOSIS — Z87891 Personal history of nicotine dependence: Secondary | ICD-10-CM

## 2023-09-22 DIAGNOSIS — E041 Nontoxic single thyroid nodule: Secondary | ICD-10-CM | POA: Diagnosis not present

## 2023-10-13 ENCOUNTER — Telehealth: Payer: Self-pay | Admitting: Acute Care

## 2023-10-13 DIAGNOSIS — F17210 Nicotine dependence, cigarettes, uncomplicated: Secondary | ICD-10-CM

## 2023-10-13 DIAGNOSIS — Z87891 Personal history of nicotine dependence: Secondary | ICD-10-CM

## 2023-10-13 DIAGNOSIS — Z122 Encounter for screening for malignant neoplasm of respiratory organs: Secondary | ICD-10-CM

## 2023-10-13 NOTE — Telephone Encounter (Signed)
 Left VM for patient to call [phone] to review results of recent LDCT.  LR2 with recommendation for thyroid  ultrasound.  Thyroid  nodules has been noted previously but had some growth.

## 2023-10-13 NOTE — Telephone Encounter (Unsigned)
 Copied from CRM [id number]. Topic: Clinical - Lab/Test Results >> Oct 13, 2023  1:54 PM Joesph PARAS wrote: Reason for CRM: Relayed partial results of CT, informed of recommendation for ultrasound and noted nodule growth. Patient would like to discuss ultrasound and next steps with [facility] when able. Please return call to patient.

## 2023-10-16 ENCOUNTER — Other Ambulatory Visit: Payer: Self-pay | Admitting: Physician Assistant

## 2023-10-16 DIAGNOSIS — E041 Nontoxic single thyroid nodule: Secondary | ICD-10-CM

## 2023-10-16 NOTE — Addendum Note (Signed)
 Addended by: DIONISIO AQUAS on: 10/16/2023 04:11 PM   Modules accepted: Orders

## 2023-10-16 NOTE — Telephone Encounter (Signed)
 Returned call to patient and advised no suspicious findings for lung cancer but recommendation for ultrasound of thyroid  nodule. Patient states she has seen the thyroid  nodule on reports previously but never recalls any additional evaluation.  Emphysema and atherosclerosis, as previously noted on LDCT.  Will reach out to PCP to see if she is agreeable to ordering the thyroid  US .  Someone will be reaching out to schedule the imaging.  Patient states she is agreeable to any imaging facility for scheduling.  Annual LDCT order placed and results/message sent to PCP regarding results and recommended ultrasound.

## 2023-10-17 ENCOUNTER — Encounter (INDEPENDENT_AMBULATORY_CARE_PROVIDER_SITE_OTHER): Admitting: Ophthalmology

## 2023-10-17 DIAGNOSIS — B399 Histoplasmosis, unspecified: Secondary | ICD-10-CM

## 2023-10-17 DIAGNOSIS — H32 Chorioretinal disorders in diseases classified elsewhere: Secondary | ICD-10-CM | POA: Diagnosis not present

## 2023-10-17 DIAGNOSIS — H43813 Vitreous degeneration, bilateral: Secondary | ICD-10-CM

## 2023-10-17 DIAGNOSIS — H318 Other specified disorders of choroid: Secondary | ICD-10-CM

## 2023-10-18 ENCOUNTER — Ambulatory Visit
Admission: RE | Admit: 2023-10-18 | Discharge: 2023-10-18 | Disposition: A | Discharge status: Home / Self Care | Source: Ambulatory Visit | Attending: Physician Assistant | Admitting: Physician Assistant

## 2023-10-18 DIAGNOSIS — E041 Nontoxic single thyroid nodule: Secondary | ICD-10-CM

## 2023-10-18 DIAGNOSIS — E042 Nontoxic multinodular goiter: Secondary | ICD-10-CM | POA: Diagnosis not present

## 2023-10-31 ENCOUNTER — Ambulatory Visit: Payer: Self-pay | Admitting: Physician Assistant

## 2023-10-31 DIAGNOSIS — E041 Nontoxic single thyroid nodule: Secondary | ICD-10-CM

## 2023-11-01 NOTE — Progress Notes (Signed)
 Noted

## 2023-11-20 ENCOUNTER — Inpatient Hospital Stay
Admission: RE | Admit: 2023-11-20 | Discharge: 2023-11-20 | Disposition: A | Discharge status: Home / Self Care | Source: Ambulatory Visit | Attending: Physician Assistant

## 2023-11-20 ENCOUNTER — Other Ambulatory Visit (HOSPITAL_COMMUNITY)
Admission: RE | Admit: 2023-11-20 | Discharge: 2023-11-20 | Disposition: A | Discharge status: Home / Self Care | Source: Ambulatory Visit | Attending: Student | Admitting: Student

## 2023-11-20 DIAGNOSIS — E041 Nontoxic single thyroid nodule: Secondary | ICD-10-CM | POA: Diagnosis not present

## 2023-11-22 LAB — CYTOLOGY - NON PAP

## 2023-11-23 ENCOUNTER — Ambulatory Visit: Payer: Self-pay | Admitting: Physician Assistant

## 2023-11-24 ENCOUNTER — Other Ambulatory Visit: Payer: Self-pay | Admitting: Physician Assistant

## 2023-11-27 ENCOUNTER — Other Ambulatory Visit (HOSPITAL_BASED_OUTPATIENT_CLINIC_OR_DEPARTMENT_OTHER): Payer: Self-pay

## 2023-11-27 MED ORDER — ATORVASTATIN CALCIUM 40 MG PO TABS
40.0000 mg | ORAL_TABLET | Freq: Every day | ORAL | 0 refills | Status: DC
  Filled 2023-11-27: qty 90, 90d supply, fill #0

## 2023-12-12 ENCOUNTER — Ambulatory Visit (INDEPENDENT_AMBULATORY_CARE_PROVIDER_SITE_OTHER): Admitting: Physician Assistant

## 2023-12-12 ENCOUNTER — Other Ambulatory Visit (HOSPITAL_BASED_OUTPATIENT_CLINIC_OR_DEPARTMENT_OTHER): Payer: Self-pay

## 2023-12-12 ENCOUNTER — Encounter: Payer: Self-pay | Admitting: Physician Assistant

## 2023-12-12 VITALS — BP 132/80 | HR 76 | Temp 97.5°F | Ht 66.0 in | Wt 184.2 lb

## 2023-12-12 DIAGNOSIS — R21 Rash and other nonspecific skin eruption: Secondary | ICD-10-CM

## 2023-12-12 DIAGNOSIS — M810 Age-related osteoporosis without current pathological fracture: Secondary | ICD-10-CM

## 2023-12-12 DIAGNOSIS — Z1159 Encounter for screening for other viral diseases: Secondary | ICD-10-CM

## 2023-12-12 DIAGNOSIS — D510 Vitamin B12 deficiency anemia due to intrinsic factor deficiency: Secondary | ICD-10-CM | POA: Diagnosis not present

## 2023-12-12 DIAGNOSIS — J439 Emphysema, unspecified: Secondary | ICD-10-CM | POA: Diagnosis not present

## 2023-12-12 DIAGNOSIS — E785 Hyperlipidemia, unspecified: Secondary | ICD-10-CM | POA: Diagnosis not present

## 2023-12-12 LAB — COMPREHENSIVE METABOLIC PANEL WITH GFR
ALT: 11 U/L (ref 0–35)
AST: 15 U/L (ref 0–37)
Albumin: 3.8 g/dL (ref 3.5–5.2)
Alkaline Phosphatase: 79 U/L (ref 39–117)
BUN: 21 mg/dL (ref 6–23)
CO2: 31 meq/L (ref 19–32)
Calcium: 9.5 mg/dL (ref 8.4–10.5)
Chloride: 102 meq/L (ref 96–112)
Creatinine, Ser: 1.03 mg/dL (ref 0.40–1.20)
GFR: 53.54 mL/min — ABNORMAL LOW (ref >60.00)
Glucose, Bld: 98 mg/dL (ref 70–99)
Potassium: 4.3 meq/L (ref 3.5–5.1)
Sodium: 138 meq/L (ref 135–145)
Total Bilirubin: 0.5 mg/dL (ref 0.2–1.2)
Total Protein: 7.2 g/dL (ref 6.0–8.3)

## 2023-12-12 LAB — CBC WITH DIFFERENTIAL/PLATELET
Basophils Absolute: 0 K/uL (ref 0.0–0.1)
Basophils Relative: 0.6 % (ref 0.0–3.0)
Eosinophils Absolute: 0.2 K/uL (ref 0.0–0.7)
Eosinophils Relative: 3.6 % (ref 0.0–5.0)
HCT: 43.6 % (ref 36.0–46.0)
Hemoglobin: 14.4 g/dL (ref 12.0–15.0)
Lymphocytes Relative: 23.5 % (ref 12.0–46.0)
Lymphs Abs: 1.2 K/uL (ref 0.7–4.0)
MCHC: 33 g/dL (ref 30.0–36.0)
MCV: 92.7 fl (ref 78.0–100.0)
Monocytes Absolute: 0.4 K/uL (ref 0.1–1.0)
Monocytes Relative: 7.8 % (ref 3.0–12.0)
Neutro Abs: 3.3 K/uL (ref 1.4–7.7)
Neutrophils Relative %: 64.5 % (ref 43.0–77.0)
Platelets: 156 K/uL (ref 150.0–400.0)
RBC: 4.7 Mil/uL (ref 3.87–5.11)
RDW: 14.6 % (ref 11.5–15.5)
WBC: 5.1 K/uL (ref 4.0–10.5)

## 2023-12-12 LAB — VITAMIN B12: Vitamin B-12: 433 pg/mL (ref 211–911)

## 2023-12-12 LAB — IBC + FERRITIN
Ferritin: 14.9 ng/mL (ref 10.0–291.0)
Iron: 90 ug/dL (ref 42–145)
Saturation Ratios: 23 % (ref 20.0–50.0)
TIBC: 392 ug/dL (ref 250.0–450.0)
Transferrin: 280 mg/dL (ref 212.0–360.0)

## 2023-12-12 LAB — LIPID PANEL
Cholesterol: 110 mg/dL (ref 0–200)
HDL: 35 mg/dL — ABNORMAL LOW (ref >39.00)
LDL Cholesterol: 39 mg/dL (ref 0–99)
NonHDL: 75.46
Total CHOL/HDL Ratio: 3
Triglycerides: 183 mg/dL — ABNORMAL HIGH (ref 0.0–149.0)
VLDL: 36.6 mg/dL (ref 0.0–40.0)

## 2023-12-12 MED ORDER — TRIAMCINOLONE ACETONIDE 0.1 % EX CREA
1.0000 | TOPICAL_CREAM | Freq: Two times a day (BID) | CUTANEOUS | 1 refills | Status: AC
  Filled 2023-12-12: qty 80, 30d supply, fill #0
  Filled 2024-05-01: qty 80, 30d supply, fill #1

## 2023-12-12 MED ORDER — ALBUTEROL SULFATE HFA 108 (90 BASE) MCG/ACT IN AERS
2.0000 | INHALATION_SPRAY | Freq: Four times a day (QID) | RESPIRATORY_TRACT | 0 refills | Status: AC | PRN
  Filled 2023-12-12: qty 6.7, 25d supply, fill #0

## 2023-12-12 MED ORDER — BUDESONIDE-FORMOTEROL FUMARATE 80-4.5 MCG/ACT IN AERO
2.0000 | INHALATION_SPRAY | Freq: Two times a day (BID) | RESPIRATORY_TRACT | 3 refills | Status: DC
  Filled 2023-12-12: qty 10.2, 30d supply, fill #0

## 2023-12-12 NOTE — Progress Notes (Signed)
 Jodi Myers is a 73 y.o. female here for a follow up of a pre-existing problem.  History of Present Illness:   Chief Complaint  Patient presents with   Medical Management of Chronic Issues    Pt here for f/u Anemia    Discussed the use of AI scribe software for clinical note transcription with the patient, who gave verbal consent to proceed.  History of Present Illness Jodi Myers is a 73 year old female who presents for routine lab work.  She has pernicious anemia and receives iron infusions approximately annually. Her iron levels were low at 45 in February, and she anticipates they are low again. She also receives B12 shots and manages them well.  She is currently taking atorvastatin  40 mg daily for hyperlipidemia. She uses a rescue inhaler albuterol  for respiratory issues and has discussed the need for a maintenance inhaler due to increased symptoms during colder months. She runs two humidifiers to manage dry air, which helps prevent respiratory triggers.  She is compliant with her bone density checks but is not on medication for osteoporosis due to cost concerns. She continues to smoke and is up to date with her annual lung cancer screenings.    Past Medical History:  Diagnosis Date   Anemia 07/2018   bloood transfusion administered    Coronary artery disease      Social History   Tobacco Use   Smoking status: Every Day    Current packs/day: 1.50    Average packs/day: 1.5 packs/day for 49.0 years (73.5 ttl pk-yrs)    Types: Cigarettes   Smokeless tobacco: Never  Vaping Use   Vaping status: Never Used  Substance Use Topics   Alcohol use: Yes    Alcohol/week: 7.0 standard drinks of alcohol    Types: 7 Cans of beer per week    Comment: a beer a day   Drug use: Never    Past Surgical History:  Procedure Laterality Date   TONSILLECTOMY      Family History  Problem Relation Age of Onset   Diabetes Mellitus II Mother    Rheum arthritis Mother    CVA Mother     Diabetes Mellitus I Sister    Hashimoto's thyroiditis Sister    Heart attack Father    Colon cancer Neg Hx    Liver disease Neg Hx     Allergies  Allergen Reactions   Betadine [Povidone Iodine] Itching   Mango Flavoring Agent (Non-Screening) Other (See Comments)    Blisters, JUST MANGO, skin of the mango   Poison Ivy Extract Other (See Comments)    Blisters   Sporanox [Itraconazole] Other (See Comments)    Blisters    Current Medications:   Current Outpatient Medications:    acetaminophen  (TYLENOL ) 325 MG tablet, Take 2 tablets (650 mg total) by mouth every 6 (six) hours as needed for mild pain (or Fever >/= 101)., Disp: 20 tablet, Rfl: 0   albuterol  (VENTOLIN  HFA) 108 (90 Base) MCG/ACT inhaler, Inhale 2 puffs into the lungs every 6 (six) hours as needed for wheezing or shortness of breath., Disp: 6.7 g, Rfl: 2   aspirin  EC (ASPIRIN  LOW DOSE) 81 MG tablet, Take 1 tablet (81 mg total) by mouth daily. Swallow whole., Disp: 90 tablet, Rfl: 3   atorvastatin  (LIPITOR) 40 MG tablet, Take 1 tablet (40 mg total) by mouth daily., Disp: 90 tablet, Rfl: 0   Calcium  Carbonate-Vit D-Min (CALTRATE 600+D PLUS MINERALS) 600-800 MG-UNIT TABS, Take 1 tablet by mouth  2 (two) times daily., Disp: , Rfl:    cyanocobalamin  (VITAMIN B12) 1000 MCG/ML injection, Inject 1 mL (1,000 mcg total) into the skin every 21 (twenty-one) days., Disp: 12 mL, Rfl: 3   pantoprazole  (PROTONIX ) 40 MG tablet, Take 1 tablet (40 mg total) by mouth daily., Disp: 90 tablet, Rfl: 0   triamcinolone  cream (KENALOG ) 0.1 %, Apply 1 Application topically 2 (two) times daily., Disp: 80 g, Rfl: 1   Review of Systems:   Negative unless otherwise specified per HPI.  Vitals:   Vitals:   12/12/23 0848  BP: 132/80  Pulse: 76  Temp: (!) 97.5 F (36.4 C)  TempSrc: Temporal  Weight: 184 lb 4 oz (83.6 kg)  Height: 5' 6 (1.676 m)     Body mass index is 29.74 kg/m.  Physical Exam:   Physical Exam Vitals and nursing note  reviewed.  Constitutional:      General: She is not in acute distress.    Appearance: She is well-developed. She is not ill-appearing or toxic-appearing.  Cardiovascular:     Rate and Rhythm: Normal rate and regular rhythm.     Pulses: Normal pulses.     Heart sounds: Normal heart sounds, S1 normal and S2 normal.  Pulmonary:     Effort: Pulmonary effort is normal.     Breath sounds: Normal breath sounds.  Skin:    General: Skin is warm and dry.  Neurological:     Mental Status: She is alert.     GCS: GCS eye subscore is 4. GCS verbal subscore is 5. GCS motor subscore is 6.  Psychiatric:        Speech: Speech normal.        Behavior: Behavior normal. Behavior is cooperative.     Assessment and Plan:   Assessment and Plan Assessment & Plan Pernicious anemia Managed with regular B12 injections. - Continue Cyanocobalamin  1000 MCG/ML injection every 21 days. - Order lab work to assess current iron levels. - Send lab results to hematologist for further management and potential iron infusion.  Hyperlipidemia Managed with atorvastatin . Cholesterol panel overdue. - Order cholesterol panel. - Continue Atorvastatin  40 MG oral daily.  Chronic respiratory symptoms (possible COPD/asthma) Managed with rescue inhaler. May need maintenance inhaler for reduced use. - Prescribe Symbicort  twice daily  - Refill Albuterol  108 (90 Base) MCG/ACT inhalation as needed. - Advise to use rescue inhaler no more than 2-3 times a week. - Instruct to not pick up the steroid inhaler if it is too expensive and to inform the office for alternatives.  Rash Located under breast and stomach. Triamcinolone  sometimes insufficient. - Refill Triamcinolone  cream 0.1 % topical 2 times daily.  General Health Maintenance Up to date on screenings. Plans for flu and COVID vaccines. - Send prescription for COVID vaccine to pharmacy. - Plan for flu and COVID vaccines in early October.     Lucie Buttner,  PA-C

## 2023-12-14 ENCOUNTER — Ambulatory Visit: Payer: Self-pay | Admitting: Physician Assistant

## 2023-12-14 LAB — HEPATITIS C ANTIBODY: Hepatitis C Ab: NONREACTIVE

## 2023-12-19 ENCOUNTER — Encounter (INDEPENDENT_AMBULATORY_CARE_PROVIDER_SITE_OTHER): Admitting: Ophthalmology

## 2023-12-19 DIAGNOSIS — B399 Histoplasmosis, unspecified: Secondary | ICD-10-CM

## 2023-12-19 DIAGNOSIS — H318 Other specified disorders of choroid: Secondary | ICD-10-CM | POA: Diagnosis not present

## 2023-12-19 DIAGNOSIS — H32 Chorioretinal disorders in diseases classified elsewhere: Secondary | ICD-10-CM

## 2023-12-19 DIAGNOSIS — H43813 Vitreous degeneration, bilateral: Secondary | ICD-10-CM

## 2024-01-09 ENCOUNTER — Other Ambulatory Visit: Payer: Self-pay

## 2024-01-09 ENCOUNTER — Other Ambulatory Visit: Payer: Self-pay | Admitting: Hematology

## 2024-01-09 ENCOUNTER — Other Ambulatory Visit: Payer: Self-pay | Admitting: Physician Assistant

## 2024-01-09 ENCOUNTER — Other Ambulatory Visit (HOSPITAL_BASED_OUTPATIENT_CLINIC_OR_DEPARTMENT_OTHER): Payer: Self-pay

## 2024-01-09 ENCOUNTER — Encounter: Payer: Self-pay | Admitting: Hematology

## 2024-01-09 DIAGNOSIS — Z23 Encounter for immunization: Secondary | ICD-10-CM | POA: Diagnosis not present

## 2024-01-09 MED ORDER — COMIRNATY 30 MCG/0.3ML IM SUSY
0.3000 mL | PREFILLED_SYRINGE | Freq: Once | INTRAMUSCULAR | 0 refills | Status: AC
  Filled 2024-01-09: qty 0.3, 1d supply, fill #0

## 2024-01-09 MED ORDER — FLUZONE HIGH-DOSE 0.5 ML IM SUSY
0.5000 mL | PREFILLED_SYRINGE | Freq: Once | INTRAMUSCULAR | 0 refills | Status: AC
  Filled 2024-01-09: qty 0.5, 1d supply, fill #0

## 2024-01-09 MED ORDER — ASPIRIN 81 MG PO TBEC
81.0000 mg | DELAYED_RELEASE_TABLET | Freq: Every day | ORAL | 3 refills | Status: AC
  Filled 2024-01-09: qty 90, 90d supply, fill #0
  Filled 2024-02-20 – 2024-03-04 (×2): qty 90, 90d supply, fill #1

## 2024-01-09 MED ORDER — CYANOCOBALAMIN 1000 MCG/ML IJ SOLN
1000.0000 ug | INTRAMUSCULAR | 3 refills | Status: AC
  Filled 2024-01-09: qty 4, 84d supply, fill #0
  Filled 2024-02-20 – 2024-03-04 (×2): qty 4, 84d supply, fill #1

## 2024-01-15 ENCOUNTER — Other Ambulatory Visit (HOSPITAL_BASED_OUTPATIENT_CLINIC_OR_DEPARTMENT_OTHER): Payer: Self-pay

## 2024-01-15 ENCOUNTER — Ambulatory Visit: Admitting: Podiatry

## 2024-01-15 ENCOUNTER — Encounter: Payer: Self-pay | Admitting: Podiatry

## 2024-01-15 DIAGNOSIS — L600 Ingrowing nail: Secondary | ICD-10-CM

## 2024-01-15 MED ORDER — TRAMADOL HCL 50 MG PO TABS
50.0000 mg | ORAL_TABLET | Freq: Three times a day (TID) | ORAL | 0 refills | Status: AC | PRN
  Filled 2024-01-15: qty 6, 2d supply, fill #0

## 2024-01-15 MED ORDER — CEPHALEXIN 500 MG PO CAPS
500.0000 mg | ORAL_CAPSULE | Freq: Three times a day (TID) | ORAL | 0 refills | Status: DC
  Filled 2024-01-15: qty 21, 7d supply, fill #0

## 2024-01-15 NOTE — Progress Notes (Unsigned)
 Subjective: Chief Complaint  Patient presents with   Ingrown Toenail    Rm12 Patient requesting nail removal on right toes 2-5/ pain with pressure and shoe gear.   73 year old female presents the office today requesting her right toenails 2 through 5 be removed as they do cause discomfort in the area thick.  She states that she did well from the right big toenail removal at this time she wants of the other nails removed given the pain, thickening.  Currently denies any swelling or redness or any drainage.  No open lesions that she reports.  She has no other concerns today.  Objective: AAO x3, NAD DP/PT pulses palpable bilaterally, CRT less than 3 seconds Nails on the right foot 2 through 5 are hypertrophic, dystrophic as incurvation the nail borders.  There is tenderness to palpation of the associated toenails.  Today there is no swelling, redness or any drainage.  No open lesions. No pain with calf compression, swelling, warmth, erythema  Assessment: Chronic pain, ingrown toenails right toenails 2 through 5  Plan: At this time, the patient is requesting total nail removal with chemical matricectomy to the nails 2 through 5 on the right foot.  Risks and complications were discussed with the patient for which they understand and written consent was obtained. Under sterile conditions a total of 3 mL of a mixture of 2% lidocaine plain and 0.5% Marcaine plain was infiltrated in a digital block fashion to each of the digits. Once anesthetized, the skin was prepped in sterile fashion. A tourniquet was then applied. Next the next toenails 2 through 5 were removed in total making to remove all nail borders.  Once the nails were ensured to be removed area was debrided and the underlying skin was intact. There is no purulence identified in the procedure. Next phenol was then applied under standard conditions and copiously irrigated.  Silvadene was applied. A dry sterile dressing was applied. After  application of the dressing the tourniquet was removed and there is found to be an immediate capillary refill time to the digit. The patient tolerated the procedure well any complications. Post procedure instructions were discussed the patient for which he verbally understood. Discussed signs/symptoms of infection and directed to call the office immediately should any occur or go directly to the emergency room. In the meantime, encouraged to call the office with any questions, concerns, changes symptoms. -Keflex  -Tramadol as needed for pain, otherwise she is going to use OTC pain medication which she thinks she will do well with.   Return in about 2 weeks (around 01/29/2024).  Jodi Myers DPM

## 2024-01-15 NOTE — Patient Instructions (Signed)

## 2024-01-30 ENCOUNTER — Ambulatory Visit (INDEPENDENT_AMBULATORY_CARE_PROVIDER_SITE_OTHER): Admitting: Podiatry

## 2024-01-30 ENCOUNTER — Encounter: Payer: Self-pay | Admitting: Podiatry

## 2024-01-30 DIAGNOSIS — L600 Ingrowing nail: Secondary | ICD-10-CM

## 2024-01-31 NOTE — Progress Notes (Signed)
 Subjective: Chief Complaint  Patient presents with   Ingrown Toenail    Rm14 F/u 2 week nail removal right foot 2-5 toes/ patient says she is doing well.   73 year old female presents the office today for Evaluation after related to below nail avulsions of the right foot digits 2 through 5.  States that she is doing well.  She did not soak in Epsom salt soaks.  She denies any drainage or pus at this time.  Pain is improved.  Objective: AAO x3, NAD DP/PT pulses palpable bilaterally, CRT less than 3 seconds Status post toenail avulsions of right 2nd, 3rd, 4th, and 5th toes.  Scab is starting to form.  Some movements erythema around the area but thinks is more from inflammation as opposed to infection.  There is no drainage or pus identified at this time.  There is no fluctuation or crepitation.  No malodor. No pain with calf compression, swelling, warmth, erythema  Assessment: Status post total nail avulsions right foot  Plan: -All treatment options discussed with the patient including all alternatives, risks, complications.  -Appears to be healing well.  Still soaking up in salts daily at least wash with soap and water daily.  Apply a small amount of antibiotic ointment followed by bandage and I debrided that area open at nighttime.  Monitoring signs or symptoms infection or reoccurrence. -Patient encouraged to call the office with any questions, concerns, change in symptoms.   Donnice JONELLE Fees DPM

## 2024-02-02 ENCOUNTER — Other Ambulatory Visit: Payer: Self-pay | Admitting: Physician Assistant

## 2024-02-02 ENCOUNTER — Other Ambulatory Visit (HOSPITAL_BASED_OUTPATIENT_CLINIC_OR_DEPARTMENT_OTHER): Payer: Self-pay

## 2024-02-02 MED ORDER — FLUTICASONE-SALMETEROL 250-50 MCG/ACT IN AEPB
1.0000 | INHALATION_SPRAY | Freq: Two times a day (BID) | RESPIRATORY_TRACT | 1 refills | Status: AC
  Filled 2024-02-02: qty 60, 30d supply, fill #0
  Filled 2024-05-01: qty 60, 30d supply, fill #1

## 2024-02-05 ENCOUNTER — Telehealth: Payer: Self-pay | Admitting: *Deleted

## 2024-02-05 NOTE — Telephone Encounter (Signed)
 Please see message and advise

## 2024-02-05 NOTE — Telephone Encounter (Signed)
 Copied from CRM [id number]. Topic: Referral - Question >> Feb 05, 2024 11:26 AM Viola FALCON wrote: Patient wants a recommendation of a GYN doctor from Lucie Buttner who is in [place] area. Patient refused appt with Lucie says her insurance doesn't require a referral to specialist - she would just like a name of GYN doctor in her area. Please call her at [phone] (M)

## 2024-02-06 ENCOUNTER — Other Ambulatory Visit (HOSPITAL_BASED_OUTPATIENT_CLINIC_OR_DEPARTMENT_OTHER): Payer: Self-pay

## 2024-02-06 NOTE — Telephone Encounter (Signed)
 Left message on personal voicemail need to what you need to see Gyn for so she can send you to the appropriate gyn. Please call office.

## 2024-02-08 ENCOUNTER — Other Ambulatory Visit: Payer: Self-pay | Admitting: Physician Assistant

## 2024-02-08 DIAGNOSIS — N8110 Cystocele, unspecified: Secondary | ICD-10-CM

## 2024-02-08 NOTE — Telephone Encounter (Signed)
Patient returned call. Requests to be called. 

## 2024-02-08 NOTE — Telephone Encounter (Signed)
 Spoke to pt asked her what she needed a Gyn referral for? Pt said he uterus is falling out, she feels a bulge for several weeks. Denies pain and no urinary leakage. Told  pt will place referral and someone will contact you to schedule an appt or they may send you a My Chart letter with information to call and schedule.

## 2024-02-08 NOTE — Telephone Encounter (Signed)
 Left message on voicemail to call office.

## 2024-02-08 NOTE — Telephone Encounter (Signed)
 Please see message.

## 2024-02-08 NOTE — Telephone Encounter (Signed)
 Noted

## 2024-02-19 DIAGNOSIS — Z1231 Encounter for screening mammogram for malignant neoplasm of breast: Secondary | ICD-10-CM | POA: Diagnosis not present

## 2024-02-19 LAB — HM MAMMOGRAPHY

## 2024-02-20 ENCOUNTER — Other Ambulatory Visit: Payer: Self-pay

## 2024-02-20 ENCOUNTER — Encounter: Payer: Self-pay | Admitting: Hematology

## 2024-02-20 ENCOUNTER — Other Ambulatory Visit: Payer: Self-pay | Admitting: Physician Assistant

## 2024-02-20 ENCOUNTER — Encounter (INDEPENDENT_AMBULATORY_CARE_PROVIDER_SITE_OTHER): Admitting: Ophthalmology

## 2024-02-20 ENCOUNTER — Other Ambulatory Visit (HOSPITAL_BASED_OUTPATIENT_CLINIC_OR_DEPARTMENT_OTHER): Payer: Self-pay

## 2024-02-20 ENCOUNTER — Encounter: Payer: Self-pay | Admitting: Physician Assistant

## 2024-02-20 DIAGNOSIS — H43813 Vitreous degeneration, bilateral: Secondary | ICD-10-CM | POA: Diagnosis not present

## 2024-02-20 DIAGNOSIS — H32 Chorioretinal disorders in diseases classified elsewhere: Secondary | ICD-10-CM

## 2024-02-20 DIAGNOSIS — H318 Other specified disorders of choroid: Secondary | ICD-10-CM

## 2024-02-20 DIAGNOSIS — B399 Histoplasmosis, unspecified: Secondary | ICD-10-CM | POA: Diagnosis not present

## 2024-02-20 MED ORDER — PANTOPRAZOLE SODIUM 40 MG PO TBEC
40.0000 mg | DELAYED_RELEASE_TABLET | Freq: Every day | ORAL | 0 refills | Status: DC
  Filled 2024-02-20: qty 90, 90d supply, fill #0

## 2024-03-04 ENCOUNTER — Other Ambulatory Visit: Payer: Self-pay | Admitting: Physician Assistant

## 2024-03-04 ENCOUNTER — Encounter: Payer: Self-pay | Admitting: Hematology

## 2024-03-04 ENCOUNTER — Other Ambulatory Visit (HOSPITAL_BASED_OUTPATIENT_CLINIC_OR_DEPARTMENT_OTHER): Payer: Self-pay

## 2024-03-04 ENCOUNTER — Other Ambulatory Visit: Payer: Self-pay

## 2024-03-04 MED ORDER — ATORVASTATIN CALCIUM 40 MG PO TABS
40.0000 mg | ORAL_TABLET | Freq: Every day | ORAL | 0 refills | Status: DC
  Filled 2024-03-04: qty 90, 90d supply, fill #0

## 2024-03-18 ENCOUNTER — Ambulatory Visit: Payer: Self-pay

## 2024-03-18 NOTE — Telephone Encounter (Signed)
 FYI Only or Action Required?: FYI only for provider: appointment scheduled on 03/20/24.  Patient was last seen in primary care on 12/12/2023 by Job Lukes, PA.  Called Nurse Triage reporting Foot Swelling.  Symptoms began several months ago.  Interventions attempted: Nothing.  Symptoms are: stable.  Triage Disposition: See PCP When Office is Open (Within 3 Days)  Patient/caregiver understands and will follow disposition?: Yes Reason for Disposition  [1] MILD swelling of both ankles (e.g., ankle joints look swollen; or bilateral mild pedal edema) AND [2] new-onset or getting worse  (Exceptions: Caused by hot weather, already seen by doctor or NP/PA for this.)  Answer Assessment - Initial Assessment Questions Patient reports spraining this ankle when she was a teenager and it always appeared bigger than the other foot, but nothing to this extend.   1. LOCATION: Which ankle is swollen? Where is the swelling?     Left ankle and foot  2. ONSET: When did the swelling start?     Couple months ago  3. SWELLING: How bad is the swelling? Or, How large is it? (e.g., mild, moderate, severe; size of localized swelling)      Notices a ridge when taking slippers off, patient states if she's calling in, it has to be something  4. PAIN: Is there any pain? If Yes, ask: How bad is it? (Scale 0-10; or none, mild, moderate, severe)     Mild discomfort  5. CAUSE: What do you think caused the ankle swelling?     Unsure  6. OTHER SYMPTOMS: Do you have any other symptoms? (e.g., fever, chest pain, difficulty breathing, calf pain)     Denies  Protocols used: Ankle Swelling-A-AH  Copied from CRM [id number]. Topic: Clinical - Red Word Triage >> Mar 18, 2024  1:27 PM Jodi Myers wrote: Red Word that prompted transfer to Nurse Triage: Left foot and ankle are swollen

## 2024-03-18 NOTE — Telephone Encounter (Signed)
 Appt 12/17 with Samantha.

## 2024-03-20 ENCOUNTER — Ambulatory Visit: Admitting: Physician Assistant

## 2024-03-20 ENCOUNTER — Encounter: Payer: Self-pay | Admitting: Physician Assistant

## 2024-03-20 VITALS — BP 136/70 | HR 87 | Temp 97.9°F | Ht 66.0 in | Wt 190.0 lb

## 2024-03-20 DIAGNOSIS — L989 Disorder of the skin and subcutaneous tissue, unspecified: Secondary | ICD-10-CM | POA: Diagnosis not present

## 2024-03-20 DIAGNOSIS — M7989 Other specified soft tissue disorders: Secondary | ICD-10-CM | POA: Diagnosis not present

## 2024-03-20 DIAGNOSIS — R229 Localized swelling, mass and lump, unspecified: Secondary | ICD-10-CM | POA: Diagnosis not present

## 2024-03-20 NOTE — Progress Notes (Signed)
 History of Present Illness:   Chief Complaint  Patient presents with   Edema    Pt c/o left foot and ankle swelling for the past few months.    Discussed the use of AI scribe software for clinical note transcription with the patient, who gave verbal consent to proceed.  History of Present Illness   Jodi Myers is a 73 year old female who presents with increased swelling and pain in the left foot and ankle.  She reports several months of persistent left ankle and foot swelling that does not resolve overnight and is now more pronounced than her baseline larger left ankle from an old untreated sprain. Swelling varies in severity and is worse on days with less activity. She notes occasional pain across her toes, is unsure if it is related to the swelling, and denies leg pain with walking or sensory changes. She has had visible leg varicosities since youth. She is concerned about managing the swelling and pain during an upcoming plane trip to [facility] on January 1st. She takes 81 mg aspirin  daily and occasionally uses a stationary bicycle device for circulation. She often sits watching TV without elevating her legs.  Reports there is no chest pain, shortness of breath.     Right elbow with bony mass. Has been there for quite some time. Is not getting bigger but can be tenderness to palpation.  Wants to see dermatology. Has a lesion on the back of her head and wants it evaluated.   Past Medical History:  Diagnosis Date   Allergy    Anemia 07/2018   bloood transfusion administered    Blood transfusion without reported diagnosis 01/17/2018   Coronary artery disease      Social History[1]  Past Surgical History:  Procedure Laterality Date   TONSILLECTOMY      Family History  Problem Relation Age of Onset   Diabetes Mellitus II Mother    Rheum arthritis Mother    CVA Mother    Diabetes Mellitus I Sister    Hashimoto's thyroiditis Sister    Heart attack Father    Colon cancer  Neg Hx    Liver disease Neg Hx     Allergies[2]  Current Medications:  Current Medications[3]   Review of Systems:   Negative unless otherwise specified per HPI.  Vitals:   Vitals:   03/20/24 1326  BP: 136/70  Pulse: 87  Temp: 97.9 F (36.6 C)  TempSrc: Temporal  SpO2: 94%  Weight: 190 lb (86.2 kg)  Height: 5' 6 (1.676 m)     Body mass index is 30.67 kg/m.  Physical Exam:   Physical Exam Vitals and nursing note reviewed.  Constitutional:      General: She is not in acute distress.    Appearance: She is well-developed. She is not ill-appearing or toxic-appearing.  Cardiovascular:     Rate and Rhythm: Normal rate and regular rhythm.     Pulses: Normal pulses.     Heart sounds: Normal heart sounds, S1 normal and S2 normal.  Pulmonary:     Effort: Pulmonary effort is normal.     Breath sounds: Normal breath sounds.  Musculoskeletal:     Right lower leg: No edema.     Left lower leg: 1+ Pitting Edema present.     Comments: Bony nodule to right elbow  Skin:    General: Skin is warm and dry.  Neurological:     Mental Status: She is alert.     GCS:  GCS eye subscore is 4. GCS verbal subscore is 5. GCS motor subscore is 6.  Psychiatric:        Speech: Speech normal.        Behavior: Behavior normal. Behavior is cooperative.     Assessment and Plan:   Assessment and Plan    Left lower leg swelling Suspect chronic venous insufficiency. . Prominent varicosities and spider veins suggest venous insufficiency. Swelling is unilateral with no systemic symptom(s). - Ordered bilateral lower extremity ultrasound to assess blood Myers and rule out deep vein thrombosis. - Advised use of compression stockings. - Recommended leg elevation and increased activity to improve venous return. - Instructed on leg pumps during flights to prevent swelling.  Elbow nodule Hard nodule on the elbow, possibly a bony calcification. - Referred to sports medicine for evaluation of elbow  nodule.  Skin lesion New mole on the back of the head, concerning for potential skin cancer. - Referred to dermatologist for evaluation of skin lesion.  General health maintenance Discussion of general health maintenance including dermatology and gynecology referrals. - Referred to dermatologist for skin lesion evaluation. - Confirmed gynecology appointment with Dr. Lynwood Solomons on January 14th.         Lucie Buttner, PA-C    [1]  Social History Tobacco Use   Smoking status: Every Day    Current packs/day: 1.50    Average packs/day: 1.5 packs/day for 49.0 years (73.5 ttl pk-yrs)    Types: Cigarettes   Smokeless tobacco: Never  Vaping Use   Vaping status: Never Used  Substance Use Topics   Alcohol use: Yes    Alcohol/week: 7.0 standard drinks of alcohol    Types: 7 Cans of beer per week    Comment: a beer a day   Drug use: Never  [2]  Allergies Allergen Reactions   Betadine [Povidone Iodine] Itching   Mango Flavoring Agent (Non-Screening) Other (See Comments)    Blisters, JUST MANGO, skin of the mango   Poison Ivy Extract Other (See Comments)    Blisters   Sporanox [Itraconazole] Other (See Comments)    Blisters  [3]  Current Outpatient Medications:    acetaminophen  (TYLENOL ) 325 MG tablet, Take 2 tablets (650 mg total) by mouth every 6 (six) hours as needed for mild pain (or Fever >/= 101)., Disp: 20 tablet, Rfl: 0   albuterol  (VENTOLIN  HFA) 108 (90 Base) MCG/ACT inhaler, Inhale 2 puffs into the lungs every 6 (six) hours as needed for wheezing or shortness of breath., Disp: 8 g, Rfl: 0   aspirin  EC (ASPIRIN  LOW DOSE) 81 MG tablet, Take 1 tablet (81 mg total) by mouth daily. Swallow whole., Disp: 90 tablet, Rfl: 3   atorvastatin  (LIPITOR) 40 MG tablet, Take 1 tablet (40 mg total) by mouth daily., Disp: 90 tablet, Rfl: 0   Calcium  Carbonate-Vit D-Min (CALTRATE 600+D PLUS MINERALS) 600-800 MG-UNIT TABS, Take 1 tablet by mouth 2 (two) times daily., Disp: , Rfl:     cyanocobalamin  (VITAMIN B12) 1000 MCG/ML injection, Inject 1 mL (1,000 mcg total) into the skin every 21 (twenty-one) days., Disp: 12 mL, Rfl: 3   fluticasone -salmeterol (WIXELA INHUB ) 250-50 MCG/ACT AEPB, Inhale 1 puff into the lungs in the morning and at bedtime., Disp: 60 each, Rfl: 1   pantoprazole  (PROTONIX ) 40 MG tablet, Take 1 tablet (40 mg total) by mouth daily., Disp: 90 tablet, Rfl: 0   triamcinolone  cream (KENALOG ) 0.1 %, Apply 1 Application topically 2 (two) times daily., Disp: 80 g, Rfl: 1

## 2024-03-21 ENCOUNTER — Ambulatory Visit: Payer: Self-pay | Admitting: Physician Assistant

## 2024-03-21 ENCOUNTER — Ambulatory Visit (HOSPITAL_COMMUNITY): Admission: RE | Admit: 2024-03-21 | Source: Ambulatory Visit

## 2024-03-21 DIAGNOSIS — M7989 Other specified soft tissue disorders: Secondary | ICD-10-CM | POA: Insufficient documentation

## 2024-03-23 ENCOUNTER — Encounter: Payer: Self-pay | Admitting: Physician Assistant

## 2024-03-25 NOTE — Telephone Encounter (Signed)
 Pt needing recommendations on light or firm compression for stockings

## 2024-03-26 ENCOUNTER — Other Ambulatory Visit (HOSPITAL_BASED_OUTPATIENT_CLINIC_OR_DEPARTMENT_OTHER): Payer: Self-pay

## 2024-03-26 ENCOUNTER — Ambulatory Visit: Admitting: Podiatry

## 2024-03-26 VITALS — Ht 66.0 in | Wt 190.0 lb

## 2024-03-26 DIAGNOSIS — L600 Ingrowing nail: Secondary | ICD-10-CM

## 2024-03-26 MED ORDER — CEPHALEXIN 500 MG PO CAPS
500.0000 mg | ORAL_CAPSULE | Freq: Three times a day (TID) | ORAL | 0 refills | Status: DC
  Filled 2024-03-26: qty 21, 7d supply, fill #0

## 2024-03-26 NOTE — Patient Instructions (Signed)

## 2024-03-26 NOTE — Progress Notes (Signed)
 Subjective: Chief Complaint  Patient presents with   Nail Problem    Rm 13 Patient is here for permanent nail avulsion of the left 1st and 2nd toe nails.   73 year old female presents the office today requesting her left first, second toenails to be removed in total given ongoing discomfort with them and as well as thickening.  She had them removed on the right foot she has been doing well.  No swelling redness or any drainage.  No other concerns.   Objective: AAO x3, NAD DP/PT pulses palpable bilaterally, CRT less than 3 seconds Left hallux, second digit toenails are hypertrophic, dystrophic with yellow, brown discoloration is incurvation present nail borders.  There is no drainage or pus or signs of infection today.  No open lesions otherwise. No pain with calf compression, swelling, warmth, erythema  Assessment: Ingrown toenails, dystrophy left hallux, second digit toenails  Plan: -All treatment options discussed with the patient including all alternatives, risks, complications.  -At this time, the patient is requesting total nail removal with chemical matricectomy to the symptomatic portion of the nail. Risks and complications were discussed with the patient for which they understand and written consent was obtained. Under sterile conditions a total of 3 mL of a mixture of 2% lidocaine plain and 0.5% Marcaine plain was infiltrated in a hallux block fashion. Once anesthetized, the skin was prepped in sterile fashion. A tourniquet was then applied. Next the left hallux, second digit toenails were removed in total remove all nail borders.  Once the nails were ensured to be removed area was debrided and the underlying skin was intact. There is no purulence identified in the procedure. Next phenol was then applied under standard conditions and copiously irrigated.  Silvadene was applied. A dry sterile dressing was applied. After application of the dressing the tourniquet was removed and there is  found to be an immediate capillary refill time to the digit. The patient tolerated the procedure well any complications. Post procedure instructions were discussed the patient for which he verbally understood. Discussed signs/symptoms of infection and directed to call the office immediately should any occur or go directly to the emergency room. In the meantime, encouraged to call the office with any questions, concerns, changes symptoms. -Keflex  -Patient encouraged to call the office with any questions, concerns, change in symptoms.   Jodi Myers DPM

## 2024-04-04 ENCOUNTER — Encounter: Payer: Self-pay | Admitting: Hematology

## 2024-04-11 ENCOUNTER — Ambulatory Visit (INDEPENDENT_AMBULATORY_CARE_PROVIDER_SITE_OTHER): Admitting: Family Medicine

## 2024-04-11 ENCOUNTER — Other Ambulatory Visit: Payer: Self-pay

## 2024-04-11 ENCOUNTER — Ambulatory Visit

## 2024-04-11 VITALS — BP 162/94 | HR 68 | Ht 66.0 in | Wt 194.0 lb

## 2024-04-11 DIAGNOSIS — R2231 Localized swelling, mass and lump, right upper limb: Secondary | ICD-10-CM

## 2024-04-11 DIAGNOSIS — D1721 Benign lipomatous neoplasm of skin and subcutaneous tissue of right arm: Secondary | ICD-10-CM

## 2024-04-11 NOTE — Patient Instructions (Addendum)
Thank you for coming in today.   Please get an Xray today before you leave   Check back as needed

## 2024-04-11 NOTE — Progress Notes (Signed)
"       ° °  LILLETTE Ileana Collet, PhD, LAT, ATC acting as a scribe for Artist Lloyd, MD.  Jodi Myers is a 74 y.o. female who presents to [facility] at [facility] today for R elbow nodule that she noticed a few months. She doesn't recall hitting her elbow on anything. Nodule is located on the posterior aspect of her distal elbow. Only uncomfortable when she rests her arm down.   Dx testing: 11/08/22 DEXA  Pertinent review of systems: No fevers or chills  Relevant historical information: History of a DVT.  Other lipomas present in her legs.   Exam:  BP (!) 162/94   Pulse 68   Ht 5' 6 (1.676 m)   Wt 194 lb (88 kg)   SpO2 96%   BMI 31.31 kg/m  General: Well Developed, well nourished, and in no acute distress.   MSK: Right elbow visible nodule just distal to the olecranon on the ulnar side of the elbow.  Nontender to palpation slightly mobile.    Lab and Radiology Results  Diagnostic Limited MSK Ultrasound of: Right elbow Nodule visualized overlying bone cortex of ulna near olecranon. Nodule is consistent in appearance to surrounding subcutaneous fat and measures 0.2 x 1 cm.  No blood flow or calcifications present within the nodule. Impression: Nodule most consistent with lipoma.   X-ray images right elbow obtained today personally and independently interpreted. Normal-appearing bone near nodule.  Mild elbow DJD.  No acute fractures are visible. Await formal radiology review   Assessment and Plan: 74 y.o. female with right elbow nodule most consistent with lipoma.  Of note ultrasound is not sufficient for making a full diagnosis of lipoma.  Plan for watchful waiting at this time.  If worsening or becoming bothersome we will proceed to MRI.  For now watchful waiting.   PDMP not reviewed this encounter. Orders Placed This Encounter  Procedures   US  LIMITED JOINT SPACE STRUCTURES UP RIGHT(NO LINKED CHARGES)    Reason for Exam (SYMPTOM  OR DIAGNOSIS REQUIRED):   right  elbow nodule    Preferred imaging location?:   [facility]   DG ELBOW COMPLETE RIGHT (3+VIEW)    Standing Status:   Future    Number of Occurrences:   1    Expiration Date:   05/12/2024    Reason for Exam (SYMPTOM  OR DIAGNOSIS REQUIRED):   right elbow nodule    Preferred imaging location?:   [facility]   No orders of the defined types were placed in this encounter.    Discussed warning signs or symptoms. Please see discharge instructions. Patient expresses understanding.   The above documentation has been reviewed and is accurate and complete Artist Lloyd, M.D.   "

## 2024-04-17 ENCOUNTER — Ambulatory Visit: Admitting: Obstetrics & Gynecology

## 2024-04-17 ENCOUNTER — Encounter: Payer: Self-pay | Admitting: Obstetrics & Gynecology

## 2024-04-17 VITALS — Ht 66.0 in | Wt 192.3 lb

## 2024-04-17 DIAGNOSIS — N814 Uterovaginal prolapse, unspecified: Secondary | ICD-10-CM | POA: Diagnosis not present

## 2024-04-17 NOTE — Progress Notes (Signed)
 Pt believes she has a prolapsed uterus. Pt states I can feel it.

## 2024-04-18 ENCOUNTER — Ambulatory Visit: Payer: Self-pay | Admitting: Family Medicine

## 2024-04-18 NOTE — Progress Notes (Signed)
Right elbow x-ray looks okay to radiology.

## 2024-04-18 NOTE — Progress Notes (Signed)
 Patient ID: Jodi Myers, female   DOB: [birth year], 74 y.o.   MRN: [id number]  CC: feels cervix at her vagina for 2 months   HPI Jodi Myers is a 74 y.o. female.  [id number] Postmenopausal; With above CC. No pain or urinary incontinence HPI  Past Medical History:  Diagnosis Date   Allergy    Anemia 07/2018   bloood transfusion administered    Blood transfusion without reported diagnosis 01/17/2018   Coronary artery disease     Past Surgical History:  Procedure Laterality Date   TONSILLECTOMY      Family History  Problem Relation Age of Onset   Diabetes Mellitus II Mother    Rheum arthritis Mother    CVA Mother    Diabetes Mellitus I Sister    Hashimoto's thyroiditis Sister    Heart attack Father    Colon cancer Neg Hx    Liver disease Neg Hx     Social History Social History[1]  Allergies[2]  Current Outpatient Medications  Medication Sig Dispense Refill   albuterol  (VENTOLIN  HFA) 108 (90 Base) MCG/ACT inhaler Inhale 2 puffs into the lungs every 6 (six) hours as needed for wheezing or shortness of breath. 8 g 0   aspirin  EC (ASPIRIN  LOW DOSE) 81 MG tablet Take 1 tablet (81 mg total) by mouth daily. Swallow whole. 90 tablet 3   atorvastatin  (LIPITOR) 40 MG tablet Take 1 tablet (40 mg total) by mouth daily. 90 tablet 0   Calcium  Carbonate-Vit D-Min (CALTRATE 600+D PLUS MINERALS) 600-800 MG-UNIT TABS Take 1 tablet by mouth 2 (two) times daily.     cyanocobalamin  (VITAMIN B12) 1000 MCG/ML injection Inject 1 mL (1,000 mcg total) into the skin every 21 (twenty-one) days. 12 mL 3   fluticasone -salmeterol (WIXELA INHUB ) 250-50 MCG/ACT AEPB Inhale 1 puff into the lungs in the morning and at bedtime. 60 each 1   pantoprazole  (PROTONIX ) 40 MG tablet Take 1 tablet (40 mg total) by mouth daily. 90 tablet 0   triamcinolone  cream (KENALOG ) 0.1 % Apply 1 Application topically 2 (two) times daily. 80 g 1   No current facility-administered medications for this visit.    Review of  Systems Review of Systems  Constitutional: Negative.   Gastrointestinal: Negative.   Genitourinary:  Positive for difficulty urinating. Negative for flank pain, frequency and vaginal bleeding.    Height 5' 6 (1.676 m), weight 192 lb 4.8 oz (87.2 kg).  Physical Exam Physical Exam Vitals and nursing note reviewed. Exam conducted with a chaperone present.  Constitutional:      Appearance: Normal appearance.  Cardiovascular:     Rate and Rhythm: Normal rate.  Pulmonary:     Effort: Pulmonary effort is normal.  Abdominal:     General: Abdomen is flat.  Genitourinary:    General: Normal vulva.     Exam position: Lithotomy position.     Vagina: Normal.     Comments: Cervix prolapsed to the introitus, no pelvic mass and vagina well supported Skin:    General: Skin is warm and dry.  Neurological:     General: No focal deficit present.     Mental Status: She is alert.  Psychiatric:        Mood and Affect: Mood normal.        Behavior: Behavior normal.     Data Reviewed   Assessment Uterine prolapse without vaginal wall prolapse    Plan  Referred to Dr. Jeralyn for consult possible surgery      Jodi Myers  Jodi Myers 04/18/2024, 5:36 PM       [1]  Social History Tobacco Use   Smoking status: Every Day    Current packs/day: 1.50    Average packs/day: 1.5 packs/day for 49.0 years (73.5 ttl pk-yrs)    Types: Cigarettes   Smokeless tobacco: Never  Vaping Use   Vaping status: Never Used  Substance Use Topics   Alcohol use: Yes    Alcohol/week: 7.0 standard drinks of alcohol    Types: 7 Cans of beer per week    Comment: a beer a day   Drug use: Never  [2]  Allergies Allergen Reactions   Betadine [Povidone Iodine] Itching   Mango Flavoring Agent (Non-Screening) Other (See Comments)    Blisters, JUST MANGO, skin of the mango   Poison Ivy Extract Other (See Comments)    Blisters   Sporanox [Itraconazole] Other (See Comments)    Blisters

## 2024-04-23 ENCOUNTER — Encounter (INDEPENDENT_AMBULATORY_CARE_PROVIDER_SITE_OTHER): Admitting: Ophthalmology

## 2024-04-23 DIAGNOSIS — H43813 Vitreous degeneration, bilateral: Secondary | ICD-10-CM | POA: Diagnosis not present

## 2024-04-23 DIAGNOSIS — H32 Chorioretinal disorders in diseases classified elsewhere: Secondary | ICD-10-CM | POA: Diagnosis not present

## 2024-04-23 DIAGNOSIS — H318 Other specified disorders of choroid: Secondary | ICD-10-CM

## 2024-04-23 DIAGNOSIS — B399 Histoplasmosis, unspecified: Secondary | ICD-10-CM

## 2024-04-25 ENCOUNTER — Ambulatory Visit: Admitting: Podiatry

## 2024-04-29 ENCOUNTER — Other Ambulatory Visit: Payer: Self-pay | Admitting: Physician Assistant

## 2024-04-30 ENCOUNTER — Encounter: Payer: Self-pay | Admitting: Podiatry

## 2024-04-30 ENCOUNTER — Ambulatory Visit: Admitting: Podiatry

## 2024-04-30 ENCOUNTER — Other Ambulatory Visit (HOSPITAL_BASED_OUTPATIENT_CLINIC_OR_DEPARTMENT_OTHER): Payer: Self-pay

## 2024-04-30 DIAGNOSIS — L600 Ingrowing nail: Secondary | ICD-10-CM

## 2024-04-30 MED ORDER — ATORVASTATIN CALCIUM 40 MG PO TABS
40.0000 mg | ORAL_TABLET | Freq: Every day | ORAL | 0 refills | Status: AC
  Filled 2024-04-30 – 2024-05-01 (×2): qty 90, 90d supply, fill #0

## 2024-04-30 MED ORDER — PANTOPRAZOLE SODIUM 40 MG PO TBEC
40.0000 mg | DELAYED_RELEASE_TABLET | Freq: Every day | ORAL | 0 refills | Status: AC
  Filled 2024-04-30 – 2024-05-01 (×2): qty 90, 90d supply, fill #0

## 2024-04-30 NOTE — Patient Instructions (Signed)

## 2024-04-30 NOTE — Progress Notes (Signed)
 Subjective: Chief Complaint  Patient presents with   Procedure    Patient requesting removal total nail of the 3rd, 4th, 5th nails left foot - 1st and 2nd left patient states are healing well    74 year old female presents today with the above concerns.  She said the other nails have been doing well and nails 3, 4, 5 on the left side are causing pain and she was to have these removed permanently as well.  No swelling redness or any drainage.  Objective: AAO x3, NAD DP/PT pulses palpable bilaterally, CRT less than 3 seconds Nails on the left foot third, fourth, fifth are hypertrophic, dystrophic with yellow discoloration there is incurvation of the nail borders and the cause discomfort.  No swelling, redness or any drainage.  There are no open lesions identified today.  Nails on the left first, second with granulation tissue present still but is no surrounding erythema, ascending cellulitis but is no change or pus or signs of infection. No pain with calf compression, swelling, warmth, erythema  Assessment: Chronic ingrown toenails, onychodystrophy left foot  Plan: -All treatment options discussed with the patient including all alternatives, risks, complications.  -At this time, the patient is total partial nail removal with chemical matricectomy to the symptomatic portion of the nail. Risks and complications were discussed with the patient for which they understand and written consent was obtained. Under sterile conditions a total of 3 mL of a mixture of 2% lidocaine plain and 0.5% Marcaine plain was infiltrated in a hallux block fashion. Once anesthetized, the skin was prepped in sterile fashion. A tourniquet was then applied. Next the left 3rd, 4th, and 5th toenail were removed in total. Once the nails were ensured to be removed area was debrided and the underlying skin was intact. There is no purulence identified in the procedure. Next phenol was then applied under standard conditions and  copiously irrigated. Silvadene was applied. A dry sterile dressing was applied. After application of the dressing the tourniquet was removed and there is found to be an immediate capillary refill time to the digit. The patient tolerated the procedure well any complications. Post procedure instructions were discussed the patient for which he verbally understood. Discussed signs/symptoms of infection and directed to call the office immediately should any occur or go directly to the emergency room. In the meantime, encouraged to call the office with any questions, concerns, changes symptoms. -Continue a small amount of antibiotic ointment dressing changes to the left first, second toes daily but keep the area open at nighttime.  Monitor for any signs or symptoms of infection. -Patient encouraged to call the office with any questions, concerns, change in symptoms.   Return in about 3 weeks (around 05/21/2024), or if symptoms worsen or fail to improve, for nail check .   Jodi Myers DPM

## 2024-05-01 ENCOUNTER — Other Ambulatory Visit: Payer: Self-pay

## 2024-05-01 ENCOUNTER — Other Ambulatory Visit (HOSPITAL_BASED_OUTPATIENT_CLINIC_OR_DEPARTMENT_OTHER): Payer: Self-pay

## 2024-05-06 ENCOUNTER — Ambulatory Visit: Admitting: Podiatry

## 2024-05-15 ENCOUNTER — Ambulatory Visit: Payer: Self-pay | Admitting: Obstetrics and Gynecology

## 2024-05-28 ENCOUNTER — Inpatient Hospital Stay: Payer: Medicare Other | Admitting: Hematology

## 2024-05-28 ENCOUNTER — Inpatient Hospital Stay: Payer: Medicare Other

## 2024-06-25 ENCOUNTER — Encounter (INDEPENDENT_AMBULATORY_CARE_PROVIDER_SITE_OTHER): Admitting: Ophthalmology

## 2024-09-23 ENCOUNTER — Other Ambulatory Visit

## 2024-11-19 ENCOUNTER — Ambulatory Visit: Admitting: Physician Assistant
